# Patient Record
Sex: Male | Born: 1945 | Race: White | Hispanic: No | Marital: Married | Smoking: Never smoker
Health system: Southern US, Community
[De-identification: ages and names within clinical notes are randomized; demographics above are authoritative.]

## PROBLEM LIST (undated history)

## (undated) DIAGNOSIS — F419 Anxiety disorder, unspecified: Secondary | ICD-10-CM

## (undated) DIAGNOSIS — Z45018 Encounter for adjustment and management of other part of cardiac pacemaker: Secondary | ICD-10-CM

## (undated) DIAGNOSIS — F1020 Alcohol dependence, uncomplicated: Secondary | ICD-10-CM

## (undated) DIAGNOSIS — Z950 Presence of cardiac pacemaker: Secondary | ICD-10-CM

## (undated) DIAGNOSIS — F32A Depression, unspecified: Secondary | ICD-10-CM

## (undated) DIAGNOSIS — I10 Essential (primary) hypertension: Secondary | ICD-10-CM

## (undated) DIAGNOSIS — I495 Sick sinus syndrome: Secondary | ICD-10-CM

## (undated) DIAGNOSIS — F329 Major depressive disorder, single episode, unspecified: Secondary | ICD-10-CM

## (undated) DIAGNOSIS — C61 Malignant neoplasm of prostate: Secondary | ICD-10-CM

## (undated) DIAGNOSIS — C801 Malignant (primary) neoplasm, unspecified: Secondary | ICD-10-CM

## (undated) DIAGNOSIS — K219 Gastro-esophageal reflux disease without esophagitis: Secondary | ICD-10-CM

## (undated) DIAGNOSIS — Z9079 Acquired absence of other genital organ(s): Secondary | ICD-10-CM

## (undated) DIAGNOSIS — E785 Hyperlipidemia, unspecified: Secondary | ICD-10-CM

## (undated) DIAGNOSIS — N433 Hydrocele, unspecified: Secondary | ICD-10-CM

## (undated) DIAGNOSIS — I350 Nonrheumatic aortic (valve) stenosis: Secondary | ICD-10-CM

## (undated) DIAGNOSIS — M797 Fibromyalgia: Secondary | ICD-10-CM

## (undated) DIAGNOSIS — M109 Gout, unspecified: Secondary | ICD-10-CM

## (undated) HISTORY — PX: APPENDECTOMY: SHX54

## (undated) HISTORY — DX: Malignant neoplasm of prostate: C61

## (undated) HISTORY — PX: PROSTATECTOMY: SHX69

## (undated) HISTORY — DX: Gastro-esophageal reflux disease without esophagitis: K21.9

## (undated) HISTORY — DX: Hyperlipidemia, unspecified: E78.5

## (undated) HISTORY — DX: Acquired absence of other genital organ(s): Z90.79

## (undated) HISTORY — DX: Malignant (primary) neoplasm, unspecified: C80.1

## (undated) HISTORY — DX: Essential (primary) hypertension: I10

## (undated) HISTORY — PX: HYDROCELE EXCISION / REPAIR: SUR1145

## (undated) HISTORY — DX: Hydrocele, unspecified: N43.3

## (undated) HISTORY — DX: Nonrheumatic aortic (valve) stenosis: I35.0

## (undated) HISTORY — DX: Anxiety disorder, unspecified: F41.9

## (undated) HISTORY — DX: Fibromyalgia: M79.7

## (undated) HISTORY — DX: Gout, unspecified: M10.9

---
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
## 1985-11-07 ENCOUNTER — Ambulatory Visit: Admit: 1985-11-07 | Payer: Self-pay | Source: Ambulatory Visit

## 1987-07-28 ENCOUNTER — Ambulatory Visit: Admit: 1987-07-28 | Payer: Self-pay | Source: Ambulatory Visit

## 1988-05-01 ENCOUNTER — Ambulatory Visit: Admit: 1988-05-01 | Payer: Self-pay | Source: Ambulatory Visit

## 1988-05-05 ENCOUNTER — Ambulatory Visit: Admit: 1988-05-05 | Payer: Self-pay | Source: Ambulatory Visit

## 1988-05-07 ENCOUNTER — Ambulatory Visit: Admit: 1988-05-07 | Payer: Self-pay | Source: Ambulatory Visit

## 1988-08-31 ENCOUNTER — Ambulatory Visit: Admit: 1988-08-31 | Payer: Self-pay | Source: Ambulatory Visit

## 1990-04-05 ENCOUNTER — Ambulatory Visit: Admit: 1990-04-05 | Payer: Self-pay | Source: Ambulatory Visit

## 1990-05-19 ENCOUNTER — Ambulatory Visit: Admit: 1990-05-19 | Payer: Self-pay | Source: Ambulatory Visit

## 1997-12-25 ENCOUNTER — Ambulatory Visit: Admit: 1997-12-25 | Disposition: A | Discharge status: Home / Self Care | Payer: Self-pay | Source: Ambulatory Visit

## 1998-05-01 ENCOUNTER — Ambulatory Visit: Admit: 1998-05-01 | Disposition: A | Discharge status: Home / Self Care | Payer: Self-pay | Source: Ambulatory Visit

## 2000-03-22 ENCOUNTER — Ambulatory Visit
Admission: RE | Admit: 2000-03-22 | Disposition: A | Discharge status: Home / Self Care | Payer: Self-pay | Source: Ambulatory Visit

## 2001-12-03 ENCOUNTER — Ambulatory Visit
Admission: RE | Admit: 2001-12-03 | Disposition: A | Discharge status: Home / Self Care | Payer: Self-pay | Source: Ambulatory Visit

## 2001-12-04 ENCOUNTER — Ambulatory Visit
Admission: RE | Admit: 2001-12-04 | Disposition: A | Discharge status: Home / Self Care | Payer: Self-pay | Source: Ambulatory Visit

## 2002-06-26 ENCOUNTER — Ambulatory Visit
Admission: RE | Admit: 2002-06-26 | Disposition: A | Discharge status: Home / Self Care | Payer: Self-pay | Source: Ambulatory Visit

## 2002-08-30 ENCOUNTER — Ambulatory Visit: Admit: 2002-08-30 | Disposition: A | Discharge status: Home / Self Care | Payer: Self-pay

## 2003-05-06 ENCOUNTER — Ambulatory Visit
Admission: RE | Admit: 2003-05-06 | Disposition: A | Discharge status: Home / Self Care | Payer: Self-pay | Source: Ambulatory Visit

## 2003-06-30 ENCOUNTER — Ambulatory Visit
Admission: RE | Admit: 2003-06-30 | Disposition: A | Discharge status: Home / Self Care | Payer: Self-pay | Source: Ambulatory Visit

## 2003-10-12 HISTORY — PX: PROSTATECTOMY, RADICAL: SHX4999

## 2003-10-27 ENCOUNTER — Ambulatory Visit
Admission: RE | Admit: 2003-10-27 | Disposition: A | Discharge status: Home / Self Care | Payer: Self-pay | Source: Ambulatory Visit

## 2004-01-23 ENCOUNTER — Ambulatory Visit: Admit: 2004-01-23 | Disposition: A | Discharge status: Home / Self Care | Payer: Self-pay

## 2004-09-28 ENCOUNTER — Ambulatory Visit
Admission: RE | Admit: 2004-09-28 | Disposition: A | Discharge status: Home / Self Care | Payer: Self-pay | Source: Ambulatory Visit

## 2004-11-20 ENCOUNTER — Ambulatory Visit: Admit: 2004-11-20 | Disposition: A | Discharge status: Home / Self Care | Payer: Self-pay | Source: Ambulatory Visit

## 2004-12-16 ENCOUNTER — Ambulatory Visit
Admission: RE | Admit: 2004-12-16 | Disposition: A | Discharge status: Home / Self Care | Payer: Self-pay | Source: Ambulatory Visit

## 2004-12-25 ENCOUNTER — Ambulatory Visit
Admission: RE | Admit: 2004-12-25 | Disposition: A | Discharge status: Home / Self Care | Payer: Self-pay | Source: Ambulatory Visit

## 2005-01-15 ENCOUNTER — Ambulatory Visit
Admission: RE | Admit: 2005-01-15 | Disposition: A | Discharge status: Home / Self Care | Payer: Self-pay | Source: Ambulatory Visit

## 2005-01-28 ENCOUNTER — Ambulatory Visit
Admission: RE | Admit: 2005-01-28 | Disposition: A | Discharge status: Home / Self Care | Payer: Self-pay | Source: Ambulatory Visit

## 2005-02-18 ENCOUNTER — Ambulatory Visit
Admission: RE | Admit: 2005-02-18 | Disposition: A | Discharge status: Home / Self Care | Payer: Self-pay | Source: Ambulatory Visit

## 2005-03-31 ENCOUNTER — Ambulatory Visit
Admission: RE | Admit: 2005-03-31 | Disposition: A | Discharge status: Home / Self Care | Payer: Self-pay | Source: Ambulatory Visit

## 2005-09-17 ENCOUNTER — Ambulatory Visit
Admission: RE | Admit: 2005-09-17 | Disposition: A | Discharge status: Home / Self Care | Payer: Self-pay | Source: Ambulatory Visit

## 2005-11-05 ENCOUNTER — Ambulatory Visit: Admit: 2005-11-05 | Disposition: A | Discharge status: Home / Self Care | Payer: Self-pay | Source: Ambulatory Visit

## 2005-11-05 ENCOUNTER — Ambulatory Visit
Admission: AD | Admit: 2005-11-05 | Disposition: A | Discharge status: Home / Self Care | Payer: Self-pay | Source: Ambulatory Visit

## 2005-11-26 ENCOUNTER — Ambulatory Visit
Admission: RE | Admit: 2005-11-26 | Disposition: A | Discharge status: Home / Self Care | Payer: Self-pay | Source: Ambulatory Visit

## 2005-11-30 ENCOUNTER — Ambulatory Visit
Admission: RE | Admit: 2005-11-30 | Disposition: A | Discharge status: Home / Self Care | Payer: Self-pay | Source: Ambulatory Visit

## 2005-12-20 ENCOUNTER — Ambulatory Visit
Admission: RE | Admit: 2005-12-20 | Disposition: A | Discharge status: Home / Self Care | Payer: Self-pay | Source: Ambulatory Visit

## 2005-12-28 ENCOUNTER — Ambulatory Visit
Admission: RE | Admit: 2005-12-28 | Disposition: A | Discharge status: Home / Self Care | Payer: Self-pay | Source: Ambulatory Visit

## 2006-01-14 ENCOUNTER — Ambulatory Visit
Admission: RE | Admit: 2006-01-14 | Disposition: A | Discharge status: Home / Self Care | Payer: Self-pay | Source: Ambulatory Visit

## 2006-02-01 ENCOUNTER — Ambulatory Visit
Admission: RE | Admit: 2006-02-01 | Disposition: A | Discharge status: Home / Self Care | Payer: Self-pay | Source: Ambulatory Visit

## 2006-04-22 ENCOUNTER — Ambulatory Visit
Admission: RE | Admit: 2006-04-22 | Disposition: A | Discharge status: Home / Self Care | Payer: Self-pay | Source: Ambulatory Visit

## 2006-05-05 ENCOUNTER — Ambulatory Visit
Admission: RE | Admit: 2006-05-05 | Disposition: A | Discharge status: Home / Self Care | Payer: Self-pay | Source: Ambulatory Visit

## 2007-04-07 ENCOUNTER — Ambulatory Visit
Admission: RE | Admit: 2007-04-07 | Disposition: A | Discharge status: Home / Self Care | Payer: Self-pay | Source: Ambulatory Visit

## 2007-04-11 ENCOUNTER — Ambulatory Visit
Admission: RE | Admit: 2007-04-11 | Disposition: A | Discharge status: Home / Self Care | Payer: Self-pay | Source: Ambulatory Visit

## 2009-09-08 ENCOUNTER — Ambulatory Visit
Admission: RE | Admit: 2009-09-08 | Disposition: A | Discharge status: Home / Self Care | Payer: Self-pay | Source: Ambulatory Visit

## 2010-02-06 ENCOUNTER — Ambulatory Visit
Admission: RE | Admit: 2010-02-06 | Disposition: A | Discharge status: Home / Self Care | Payer: Self-pay | Source: Ambulatory Visit

## 2010-09-06 ENCOUNTER — Ambulatory Visit
Admission: RE | Admit: 2010-09-06 | Disposition: A | Discharge status: Home / Self Care | Payer: Self-pay | Source: Ambulatory Visit

## 2010-12-14 ENCOUNTER — Ambulatory Visit
Admission: RE | Admit: 2010-12-14 | Disposition: A | Discharge status: Home / Self Care | Payer: Self-pay | Source: Ambulatory Visit

## 2011-02-19 ENCOUNTER — Ambulatory Visit
Admission: RE | Admit: 2011-02-19 | Disposition: A | Discharge status: Home / Self Care | Payer: Self-pay | Source: Ambulatory Visit

## 2013-03-04 NOTE — Procedures (Signed)
[facility]                               CARDIOVASCULAR LABORATORY       2-D Echocardiogram              NAME: Sean [name], Sean [name]                                   ROOM#: O/--CP              DOB:  1946/07/11                                            AGE/SEX: 67/M       WG#:[id number]       [id number]       _________________________________________________________________________              TEST DATE: 12/14/2010                          REQ PHYS: Sabino Dick, MD       ORDER#: [id number]                                INT PHYS: Sabino Dick, MD       HGT: 28'10                                                     WEIGHT: 215       TECH: RB                                                      BP: 151/109       _________________________________________________________________________              TIME OF TEST:       8:07 AM              INDICATION:       Murmur.              INTERPRETATION:       2-D and M-mode measurements were performed and reveal the following:              RVID (ED):  --  (2.1-3.2) LVPW (ES):  1.9 (0.9-2.1)        IVS (ED):   1.5 (0.7-1.1) AORTA:      3.0 (2.2-3.7)        LVID (ED):  4.3 (4.0-5.6) LVOT:       2.0 (2.0-4.0)        LVPW (ED):  1.7 (0.7-1.1) LA:         3.5 (2.5-4.0)        IVS (ES):   2.0 (0.8-2.0) EF:         --  (55-77%)         LVID (  ES):  2.4 (2.0-3.8) EPSS:       --  (2-7 MM)                       DOPPLER/COLOR FLOW:       MITRAL VALVE:  ACTUAL NORMAL    AORTIC VALVE    ACTUAL NORMAL           V MAX M/SEC:   --     (0.6-1.3) V MAX M/SEC:    --     (1.0-1.7)        T 1/2 M/SEC:   --     (30-60)   LVOT VEL:       --     (0.7-1.1)        VALVE AREA:    --     (4-6 CM)  VALVE AREA:     --     (3-5 CM)         REGURG:        --               REGURG:         --                      PISA:          --               PRES 1/2 TIME:  --                      ERO:           --               AO MAX PG:      26.8                    RV:            --                AO MEAN PG:     14.7                                  TRICUSPID VALVE ACTUAL NORMAL    PULMONIC VALVE ACTUAL NORMAL           V MAX M/SEC:    --     (0.3-0.7) V MAX M/SEC:   --     (0.6-0.9)        REGURG:         --               REGURG         --                      RV SYSTOLIC:    --     (15-30)                                          PRESSURE:       --  A complete 2-D echo including M-mode, Spectral and color flow Doppler       was performed in a 67 year old with a murmur.  The study was somewhat       technically difficult but adequate.  Blood pressure at the time of the       study was 151/109.              1.  Atria#  The atria are grossly normal in size, the left atrium           measuring 3.5 cm in the parasternal long axis view.  The atrial                  septum is intact with no color flow evidence of shunt.  The inferior           vena cava is not well seen but is probably normal in size.       2.  Ventricles:  The right ventricle is normal in size with normal           contraction.  The left ventricle is normal in size.  Left ventricular           end diastolic dimension is 4.3 cm and end systolic dimension 2.4 cm.           There is moderate left ventricular hypertrophy present, the posterior           wall measuring 1.7 cm, septum 1.5 cm.  The ejection fraction would           appear to be in the 65% range with no regional wall motion           abnormalities.       3.  Great Vessels:  The aorta measures 3.0 cm.  The pulmonary artery           is grossly normal in size.       4.  Aortic valve:  Probably trileaflet with sclerosis and           calcification.  There is some decreased mobility.  The peak velocity           across the valve was 258 cm/sec with a peak instantaneous gradient of           27 mmHg, a mean gradient of 15 mmHg and valve area somewhere between           1.5-1.9 cm.sq.       5.  Pulmonic valve:   Not well seen.  No abnormality.       6.  Mitral valve:  No stenosis or prolapse.  No clear mitral           regurgitation.       7.  Tricuspid valve:  Grossly normal.  No tricuspid regurgitation.              HEMODYNAMICS:              1.  There was not enough tricuspid regurgitation to permit           estimation of right ventricular systolic pressure.  Mitral Doppler           inflow demonstrates an E:A reversal with a peak E velocity of           approximately 84 cm/sec.  At the lateral annulus the E:E prime ratio  is 6.3.              CONCLUSION:       1.  Mild aortic stenosis with mean gradient of 15 mmHg.       2.  Moderate left ventricular hypertrophy with ejection fraction           approximately 65%.       3.  Grade I diastolic dysfunction.                            **PRELIMINARY REPORT UNLESS SIGNED**       SEE DOCUMENT IMAGING SYSTEM FOR FINAL REPORT                                 ________________________________                              Sabino Dick, MD                                            ID:   [id number]       DocType:   01TRVC       \:   TW       /:   569       DD:  12/14/2010 12:37:46       DT:  12/15/2010 11:47:23       JOB: [id number]       CC:  Hyman Bible II, MD ([id number])       >  Authenticated by Sabino Dick., MD On 12/16/2010 04:41:47 PM

## 2013-04-06 ENCOUNTER — Emergency Department: Payer: BC Managed Care – PPO

## 2013-04-06 ENCOUNTER — Inpatient Hospital Stay: Payer: BC Managed Care – PPO

## 2013-04-06 ENCOUNTER — Inpatient Hospital Stay
Admission: EM | Admit: 2013-04-06 | Discharge: 2013-04-12 | DRG: 242 | Disposition: A | Discharge status: Home / Self Care | Payer: BC Managed Care – PPO | Attending: Cardiovascular Disease | Admitting: Cardiovascular Disease

## 2013-04-06 ENCOUNTER — Inpatient Hospital Stay: Payer: BC Managed Care – PPO | Admitting: Critical Care Medicine

## 2013-04-06 DIAGNOSIS — J9600 Acute respiratory failure, unspecified whether with hypoxia or hypercapnia: Secondary | ICD-10-CM | POA: Diagnosis not present

## 2013-04-06 DIAGNOSIS — E860 Dehydration: Secondary | ICD-10-CM | POA: Diagnosis present

## 2013-04-06 DIAGNOSIS — Z8249 Family history of ischemic heart disease and other diseases of the circulatory system: Secondary | ICD-10-CM

## 2013-04-06 DIAGNOSIS — Y9210 Unspecified residential institution as the place of occurrence of the external cause: Secondary | ICD-10-CM | POA: Diagnosis not present

## 2013-04-06 DIAGNOSIS — I469 Cardiac arrest, cause unspecified: Secondary | ICD-10-CM | POA: Diagnosis present

## 2013-04-06 DIAGNOSIS — I219 Acute myocardial infarction, unspecified: Secondary | ICD-10-CM | POA: Diagnosis not present

## 2013-04-06 DIAGNOSIS — Y849 Medical procedure, unspecified as the cause of abnormal reaction of the patient, or of later complication, without mention of misadventure at the time of the procedure: Secondary | ICD-10-CM | POA: Diagnosis not present

## 2013-04-06 DIAGNOSIS — F1010 Alcohol abuse, uncomplicated: Secondary | ICD-10-CM | POA: Diagnosis present

## 2013-04-06 DIAGNOSIS — S2249XA Multiple fractures of ribs, unspecified side, initial encounter for closed fracture: Secondary | ICD-10-CM | POA: Diagnosis not present

## 2013-04-06 DIAGNOSIS — Z2821 Immunization not carried out because of patient refusal: Secondary | ICD-10-CM

## 2013-04-06 DIAGNOSIS — R3989 Other symptoms and signs involving the genitourinary system: Secondary | ICD-10-CM | POA: Diagnosis present

## 2013-04-06 DIAGNOSIS — E785 Hyperlipidemia, unspecified: Secondary | ICD-10-CM | POA: Diagnosis present

## 2013-04-06 DIAGNOSIS — Z79899 Other long term (current) drug therapy: Secondary | ICD-10-CM

## 2013-04-06 DIAGNOSIS — I4729 Other ventricular tachycardia: Principal | ICD-10-CM | POA: Diagnosis present

## 2013-04-06 DIAGNOSIS — I472 Ventricular tachycardia, unspecified: Principal | ICD-10-CM | POA: Diagnosis present

## 2013-04-06 DIAGNOSIS — R55 Syncope and collapse: Secondary | ICD-10-CM | POA: Diagnosis present

## 2013-04-06 DIAGNOSIS — K219 Gastro-esophageal reflux disease without esophagitis: Secondary | ICD-10-CM | POA: Diagnosis present

## 2013-04-06 DIAGNOSIS — M25519 Pain in unspecified shoulder: Secondary | ICD-10-CM | POA: Diagnosis present

## 2013-04-06 DIAGNOSIS — Z9079 Acquired absence of other genital organ(s): Secondary | ICD-10-CM

## 2013-04-06 DIAGNOSIS — I959 Hypotension, unspecified: Secondary | ICD-10-CM

## 2013-04-06 DIAGNOSIS — J90 Pleural effusion, not elsewhere classified: Secondary | ICD-10-CM | POA: Diagnosis present

## 2013-04-06 DIAGNOSIS — E872 Acidosis, unspecified: Secondary | ICD-10-CM | POA: Diagnosis present

## 2013-04-06 DIAGNOSIS — E876 Hypokalemia: Secondary | ICD-10-CM | POA: Diagnosis present

## 2013-04-06 DIAGNOSIS — M109 Gout, unspecified: Secondary | ICD-10-CM | POA: Diagnosis present

## 2013-04-06 DIAGNOSIS — Z9089 Acquired absence of other organs: Secondary | ICD-10-CM

## 2013-04-06 DIAGNOSIS — I10 Essential (primary) hypertension: Secondary | ICD-10-CM | POA: Diagnosis present

## 2013-04-06 DIAGNOSIS — N179 Acute kidney failure, unspecified: Secondary | ICD-10-CM | POA: Diagnosis present

## 2013-04-06 LAB — CBC AND DIFFERENTIAL
Basophils %: 0.3 % (ref 0.0–3.0)
Basophils Absolute: 0 10*3/uL (ref 0.0–0.3)
Eosinophils %: 0.5 % (ref 0.0–7.0)
Eosinophils Absolute: 0.1 10*3/uL (ref 0.0–0.8)
Hematocrit: 36.8 % — ABNORMAL LOW (ref 39.0–52.5)
Hemoglobin: 12.5 gm/dL — ABNORMAL LOW (ref 13.0–17.5)
Lymphocytes Absolute: 1.4 10*3/uL (ref 0.6–5.1)
Lymphocytes: 13.4 % — ABNORMAL LOW (ref 15.0–46.0)
MCH: 30 pg (ref 28–35)
MCHC: 34 gm/dL (ref 32–36)
MCV: 87 fL (ref 80–100)
MPV: 7.8 fL (ref 6.0–10.0)
Monocytes Absolute: 1 10*3/uL (ref 0.1–1.7)
Monocytes: 9.3 % (ref 3.0–15.0)
Neutrophils %: 76.5 % (ref 42.0–78.0)
Neutrophils Absolute: 7.8 10*3/uL (ref 1.7–8.6)
PLT CT: 230 10*3/uL (ref 130–440)
RBC: 4.24 10*6/uL (ref 4.00–5.70)
RDW: 13 % (ref 11.0–14.0)
WBC: 10.2 10*3/uL (ref 4.0–11.0)

## 2013-04-06 LAB — URINALYSIS
Bilirubin, UA: NEGATIVE mg/dL
Blood, UA: NEGATIVE mg/dL
Glucose, UA: NEGATIVE mg/dL
Leukocyte Esterase, UA: NEGATIVE Leu/uL
Nitrite, UA: NEGATIVE
RBC, UA: 1 /hpf (ref 0–4)
Squam Epithel, UA: <1 /hpf (ref 0–2)
Urine Protein: 30 mg/dL — AB
Urine Specific Gravity: 1.022 (ref 1.001–1.040)
Urobilinogen, UA: NORMAL mg/dL
WBC, UA: 4 /hpf (ref 0–4)
pH, Urine: 5.5 pH (ref 5.0–8.0)

## 2013-04-06 LAB — VH URINE DRUG SCREEN - NO CONFIRMATION
Amphetamine: NEGATIVE
Barbiturates: NEGATIVE
Cannabinoids: NEGATIVE
Cocaine: NEGATIVE
Opiates: NEGATIVE
Phencyclidine: NEGATIVE
Urine Benzodiazepines: POSITIVE — AB
Urine Creatinine Random: 160.86 mg/dL
Urine Methadone Screen: NEGATIVE
Urine Oxycodone: NEGATIVE
Urine Specific Gravity: 1.018 (ref 1.001–1.040)
pH, Urine: 5.4 pH (ref 5.0–8.0)

## 2013-04-06 LAB — COMPREHENSIVE METABOLIC PANEL
ALT: 26 U/L (ref 0–55)
AST (SGOT): 23 U/L (ref 10–42)
Albumin/Globulin Ratio: 0.91 Ratio (ref 0.70–1.50)
Albumin: 3 gm/dL — ABNORMAL LOW (ref 3.5–5.0)
Alkaline Phosphatase: 75 U/L (ref 40–145)
Anion Gap: 14 mMol/L (ref 7.0–18.0)
BUN / Creatinine Ratio: 13 Ratio (ref 10.0–30.0)
BUN: 21 mg/dL (ref 7–22)
Bilirubin, Total: 0.2 mg/dL (ref 0.1–1.2)
CO2: 23 mMol/L (ref 20.0–30.0)
Calcium: 9.2 mg/dL (ref 8.5–10.5)
Chloride: 105 mMol/L (ref 98–110)
Creatinine: 1.62 mg/dL — ABNORMAL HIGH (ref 0.80–1.30)
EGFR: 43 mL/min/{1.73_m2}
Globulin: 3.3 gm/dL (ref 2.0–4.0)
Glucose: 128 mg/dL — ABNORMAL HIGH (ref 70–99)
Osmolality Calc: 282 mOsm/kg (ref 275–300)
Potassium: 3 mMol/L — CL (ref 3.5–5.3)
Protein, Total: 6.3 gm/dL (ref 6.0–8.3)
Sodium: 139 mMol/L (ref 136–147)

## 2013-04-06 LAB — VH CARDIAC PROF.WITH TROPONIN
Creatine Kinase (CK): 156 U/L (ref 30–230)
Creatinine Kinase MB (CKMB): 1.8 ng/mL (ref 0.1–6.0)
Troponin I: <0.01 ng/mL (ref 0.00–0.02)

## 2013-04-06 LAB — TSH: TSH: 1.27 u[IU]/mL (ref 0.40–4.20)

## 2013-04-06 LAB — VH I-STAT LACTIC ACID NOTIFICATION

## 2013-04-06 LAB — ECG 12-LEAD
P Wave Axis: 49 deg
P Wave Duration: 98 ms
P-R Interval: 142 ms
Patient Age: 67 years
Q-T Dispersion: 68 ms
Q-T Interval(Corrected): 484 ms
Q-T Interval: 370 ms
QRS Axis: 57 deg
QRS Duration: 94 ms
T Axis: 21 deg
Ventricular Rate: 103 /min

## 2013-04-06 LAB — I-STAT LACTIC ACID
Lactic Acid I-Stat: 3.98 mMol/L (ref 0.50–2.10)
Room Number I-Stat: 27

## 2013-04-06 LAB — ACETAMINOPHEN LEVEL: Acetaminophen Level: <3 ug/mL — ABNORMAL LOW (ref 10.0–30.0)

## 2013-04-06 LAB — SALICYLATE LEVEL: Salicylate Level: <5 mg/dL — ABNORMAL LOW (ref 5.0–30.0)

## 2013-04-06 LAB — ETHANOL: Alcohol: 92 mg/dL — ABNORMAL HIGH (ref 0–9)

## 2013-04-06 LAB — PHOSPHORUS: Phosphorus: 3.1 mg/dL (ref 2.3–4.7)

## 2013-04-06 MED ORDER — POTASSIUM CHLORIDE 10 MEQ/100ML IV SOLN
INTRAVENOUS | Status: AC
Start: 2013-04-06 — End: ?
  Filled 2013-04-06: qty 100

## 2013-04-06 MED ORDER — SODIUM CHLORIDE 0.9 % IV BOLUS
1000.00 mL | Freq: Once | INTRAVENOUS | Status: AC
Start: 2013-04-06 — End: 2013-04-06
  Administered 2013-04-06: 1000 mL via INTRAVENOUS

## 2013-04-06 MED ORDER — LACTATED RINGERS IV BOLUS
1000.00 mL | Freq: Once | INTRAVENOUS | Status: AC
Start: 2013-04-06 — End: 2013-04-06
  Administered 2013-04-06: 1000 mL via INTRAVENOUS

## 2013-04-06 MED ORDER — SODIUM CHLORIDE 0.9 % IV SOLN
INTRAVENOUS | Status: DC
Start: 2013-04-06 — End: 2013-04-06

## 2013-04-06 MED ORDER — POTASSIUM CHLORIDE IN NACL 20-0.9 MEQ/L-% IV SOLN
INTRAVENOUS | Status: DC
Start: 2013-04-06 — End: 2013-04-08
  Filled 2013-04-06 (×9): qty 1000

## 2013-04-06 MED ORDER — LORAZEPAM 2 MG/ML IJ SOLN
2.0000 mg | INTRAMUSCULAR | Status: DC | PRN
Start: 2013-04-06 — End: 2013-04-12
  Administered 2013-04-10: 1 mg via INTRAVENOUS

## 2013-04-06 MED ORDER — CEFEPIME HCL 2 G IJ SOLR
INTRAMUSCULAR | Status: AC
Start: 2013-04-06 — End: ?
  Filled 2013-04-06: qty 2

## 2013-04-06 MED ORDER — SODIUM CHLORIDE 0.9 % IV SOLN
2.0000 mg/h | INTRAVENOUS | Status: DC | PRN
Start: 2013-04-06 — End: 2013-04-12
  Filled 2013-04-06: qty 50
  Filled 2013-04-06: qty 1

## 2013-04-06 MED ORDER — DULOXETINE HCL 30 MG PO CPEP
30.0000 mg | ORAL_CAPSULE | Freq: Two times a day (BID) | ORAL | Status: DC
Start: 2013-04-06 — End: 2013-04-12
  Administered 2013-04-07 – 2013-04-12 (×11): 30 mg via ORAL
  Filled 2013-04-06 (×13): qty 1

## 2013-04-06 MED ORDER — SODIUM CHLORIDE 0.9 % IV SOLN
INTRAVENOUS | Status: AC
Start: 2013-04-06 — End: ?
  Filled 2013-04-06: qty 100

## 2013-04-06 MED ORDER — ATORVASTATIN CALCIUM 20 MG PO TABS
20.00 mg | ORAL_TABLET | Freq: Every evening | ORAL | Status: DC
Start: 2013-04-06 — End: 2013-04-12
  Administered 2013-04-06 – 2013-04-11 (×6): 20 mg via ORAL
  Filled 2013-04-06 (×8): qty 1

## 2013-04-06 MED ORDER — PANTOPRAZOLE SODIUM 40 MG PO TBEC
40.0000 mg | DELAYED_RELEASE_TABLET | Freq: Every morning | ORAL | Status: DC
Start: 2013-04-07 — End: 2013-04-12
  Administered 2013-04-07 – 2013-04-12 (×6): 40 mg via ORAL
  Filled 2013-04-06 (×6): qty 1

## 2013-04-06 MED ORDER — VANCOMYCIN HCL IN DEXTROSE 1-5 GM/200ML-% IV SOLN
INTRAVENOUS | Status: AC
Start: 2013-04-06 — End: ?
  Filled 2013-04-06: qty 1000

## 2013-04-06 MED ORDER — CEFEPIME HCL 2 G IJ SOLR
2.00 g | Freq: Once | INTRAMUSCULAR | Status: AC
Start: 2013-04-06 — End: 2013-04-06
  Administered 2013-04-06: 2 g via INTRAVENOUS

## 2013-04-06 MED ORDER — VANCOMYCIN HCL IN DEXTROSE 1-5 GM/200ML-% IV SOLN
1.0000 g | Freq: Once | INTRAVENOUS | Status: AC
Start: 2013-04-06 — End: 2013-04-06
  Administered 2013-04-06: 1 g via INTRAVENOUS

## 2013-04-06 MED ORDER — LABETALOL HCL 200 MG PO TABS
200.0000 mg | ORAL_TABLET | Freq: Three times a day (TID) | ORAL | Status: DC
Start: 2013-04-06 — End: 2013-04-09
  Administered 2013-04-06 – 2013-04-09 (×8): 200 mg via ORAL
  Filled 2013-04-06 (×11): qty 1

## 2013-04-06 MED ORDER — DULOXETINE HCL 20 MG PO CPEP
20.0000 mg | ORAL_CAPSULE | Freq: Two times a day (BID) | ORAL | Status: DC
Start: 2013-04-06 — End: 2013-04-06

## 2013-04-06 MED ORDER — LABETALOL HCL 5 MG/ML IV SOLN
10.0000 mg | INTRAVENOUS | Status: DC | PRN
Start: 2013-04-06 — End: 2013-04-09

## 2013-04-06 MED ORDER — SODIUM CHLORIDE 0.9 % IV SOLN
100.0000 mL/h | INTRAVENOUS | Status: DC
Start: 2013-04-06 — End: 2013-04-06
  Administered 2013-04-06: 100 mL/h via INTRAVENOUS

## 2013-04-06 MED ORDER — AMLODIPINE BESYLATE 5 MG PO TABS
5.0000 mg | ORAL_TABLET | Freq: Every day | ORAL | Status: DC
Start: 2013-04-06 — End: 2013-04-12
  Administered 2013-04-08 – 2013-04-12 (×4): 5 mg via ORAL
  Filled 2013-04-06 (×6): qty 1

## 2013-04-06 MED ORDER — SODIUM CHLORIDE 0.9 % IV BOLUS
2000.0000 mL | Freq: Once | INTRAVENOUS | Status: AC
Start: 2013-04-06 — End: 2013-04-06
  Administered 2013-04-06: 2000 mL via INTRAVENOUS
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
## 2013-04-06 NOTE — Progress Notes (Signed)
Patient had 27 beats of V tach, Dr. Mervin Hack aware.
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
## 2013-04-06 NOTE — Progress Notes (Signed)
Patient urinated 400 dark yellow urine in urinal, UA sent.
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
## 2013-04-06 NOTE — ED Notes (Signed)
Pt states "I feel fine, I am ready to go home".  Pt verbalizes understanding that he will be going to the ICU.

## 2013-04-07 ENCOUNTER — Inpatient Hospital Stay: Payer: BC Managed Care – PPO

## 2013-04-07 LAB — COMPREHENSIVE METABOLIC PANEL
ALT: 21 U/L (ref 0–55)
AST (SGOT): 26 U/L (ref 10–42)
Albumin/Globulin Ratio: 0.82 Ratio (ref 0.70–1.50)
Albumin: 2.3 gm/dL — ABNORMAL LOW (ref 3.5–5.0)
Alkaline Phosphatase: 65 U/L (ref 40–145)
Anion Gap: 8.8 mMol/L (ref 7.0–18.0)
BUN / Creatinine Ratio: 16.7 Ratio (ref 10.0–30.0)
BUN: 18 mg/dL (ref 7–22)
Bilirubin, Total: 0.3 mg/dL (ref 0.1–1.2)
CO2: 25 mMol/L (ref 20.0–30.0)
Calcium: 8.3 mg/dL — ABNORMAL LOW (ref 8.5–10.5)
Chloride: 111 mMol/L — ABNORMAL HIGH (ref 98–110)
Creatinine: 1.08 mg/dL (ref 0.80–1.30)
EGFR: >60 mL/min/{1.73_m2}
Globulin: 2.8 gm/dL (ref 2.0–4.0)
Glucose: 107 mg/dL — ABNORMAL HIGH (ref 70–99)
Osmolality Calc: 284 mOsm/kg (ref 275–300)
Potassium: 3.8 mMol/L (ref 3.5–5.3)
Protein, Total: 5.1 gm/dL — ABNORMAL LOW (ref 6.0–8.3)
Sodium: 141 mMol/L (ref 136–147)

## 2013-04-07 LAB — CBC AND DIFFERENTIAL
Basophils %: 0 % (ref 0.0–3.0)
Basophils Absolute: 0 10*3/uL (ref 0.0–0.3)
Eosinophils %: 1 % (ref 0.0–7.0)
Eosinophils Absolute: 0.1 10*3/uL (ref 0.0–0.8)
Hematocrit: 34.4 % — ABNORMAL LOW (ref 39.0–52.5)
Hemoglobin: 11.7 gm/dL — ABNORMAL LOW (ref 13.0–17.5)
Lymphocytes Absolute: 1.3 10*3/uL (ref 0.6–5.1)
Lymphocytes: 17 % (ref 15.0–46.0)
MCH: 30 pg (ref 28–35)
MCHC: 34 gm/dL (ref 32–36)
MCV: 88 fL (ref 80–100)
MPV: 8.2 fL (ref 6.0–10.0)
Monocytes Absolute: 0.8 10*3/uL (ref 0.1–1.7)
Monocytes: 11.4 % (ref 3.0–15.0)
Neutrophils %: 70.5 % (ref 42.0–78.0)
Neutrophils Absolute: 5.2 10*3/uL (ref 1.7–8.6)
PLT CT: 187 10*3/uL (ref 130–440)
RBC: 3.91 10*6/uL — ABNORMAL LOW (ref 4.00–5.70)
RDW: 13.1 % (ref 11.0–14.0)
WBC: 7.4 10*3/uL (ref 4.0–11.0)

## 2013-04-07 LAB — LACTIC ACID, PLASMA: Lactic Acid: 1.2 mMol/L (ref 0.5–2.1)

## 2013-04-07 LAB — VH CARDIAC PROF.WITH TROPONIN
CKMB Index: 7.6 % (ref 0.0–2.3)
CKMB Index: 6.9 % (ref 0.0–2.3)
Creatine Kinase (CK): 242 U/L — ABNORMAL HIGH (ref 30–230)
Creatine Kinase (CK): 208 U/L (ref 30–230)
Creatinine Kinase MB (CKMB): 18.3 ng/mL — ABNORMAL HIGH (ref 0.1–6.0)
Creatinine Kinase MB (CKMB): 14.4 ng/mL — ABNORMAL HIGH (ref 0.1–6.0)
Troponin I: 2.49 ng/mL (ref 0.00–0.02)
Troponin I: 1.72 ng/mL (ref 0.00–0.02)

## 2013-04-07 LAB — PHOSPHORUS: Phosphorus: 2.9 mg/dL (ref 2.3–4.7)

## 2013-04-07 LAB — TROPONIN I: Troponin I: 1.09 ng/mL (ref 0.00–0.02)

## 2013-04-07 LAB — MAGNESIUM: Magnesium: 1.4 mg/dL — ABNORMAL LOW (ref 1.6–2.6)

## 2013-04-07 LAB — CKMB: Creatinine Kinase MB (CKMB): 10.7 ng/mL — ABNORMAL HIGH (ref 0.1–6.0)

## 2013-04-07 MED ORDER — ZOLPIDEM TARTRATE 5 MG PO TABS
10.00 mg | ORAL_TABLET | Freq: Every evening | ORAL | Status: DC | PRN
Start: 2013-04-07 — End: 2013-04-12
  Administered 2013-04-07 – 2013-04-10 (×3): 10 mg via ORAL
  Filled 2013-04-07 (×3): qty 2

## 2013-04-07 MED ORDER — ASPIRIN 81 MG PO TBEC
324.0000 mg | DELAYED_RELEASE_TABLET | Freq: Every day | ORAL | Status: DC
Start: 2013-04-07 — End: 2013-04-12
  Administered 2013-04-08 – 2013-04-12 (×4): 324 mg via ORAL
  Filled 2013-04-07 (×3): qty 4
  Filled 2013-04-07: qty 1
  Filled 2013-04-07: qty 4

## 2013-04-07 MED ORDER — VH MAGNESIUM SULFATE 2 G IN 50 ML IV PREMIX
2.0000 g | Freq: Once | INTRAVENOUS | Status: AC
Start: 2013-04-07 — End: 2013-04-07
  Administered 2013-04-07: 2 g via INTRAVENOUS
  Filled 2013-04-07: qty 50

## 2013-04-07 MED ORDER — POTASSIUM CHLORIDE CRYS ER 20 MEQ PO TBCR
40.0000 meq | EXTENDED_RELEASE_TABLET | Freq: Once | ORAL | Status: AC
Start: 2013-04-07 — End: 2013-04-07
  Administered 2013-04-07: 40 meq via ORAL
  Filled 2013-04-07: qty 2

## 2013-04-07 MED ORDER — ACETAMINOPHEN 80 MG PO CHEW
320.0000 mg | CHEWABLE_TABLET | Freq: Once | ORAL | Status: DC
Start: 2013-04-07 — End: 2013-04-07
  Filled 2013-04-07: qty 4

## 2013-04-07 MED ORDER — POTASSIUM CHLORIDE 20 MEQ/15ML (10%) PO SOLN
40.0000 meq | Freq: Once | ORAL | Status: DC
Start: 2013-04-07 — End: 2013-04-07
  Filled 2013-04-07: qty 30

## 2013-04-07 MED ORDER — LACTATED RINGERS IV BOLUS
1000.0000 mL | Freq: Once | INTRAVENOUS | Status: AC
Start: 2013-04-07 — End: 2013-04-07
  Administered 2013-04-07: 1000 mL via INTRAVENOUS

## 2013-04-07 MED ORDER — ASPIRIN 81 MG PO CHEW
CHEWABLE_TABLET | ORAL | Status: AC
Start: 2013-04-07 — End: 2013-04-07
  Filled 2013-04-07: qty 3

## 2013-04-07 MED ORDER — ACETAMINOPHEN 325 MG PO TABS
650.00 mg | ORAL_TABLET | Freq: Four times a day (QID) | ORAL | Status: DC | PRN
Start: 2013-04-07 — End: 2013-04-12
  Administered 2013-04-07 – 2013-04-08 (×3): 650 mg via ORAL
  Filled 2013-04-07 (×3): qty 2

## 2013-04-07 NOTE — Consults (Signed)
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 May have passed out for a few seconds and came to ER with a systolic of 60 and an elevated creatinine of 1.68. Subsequent cpks and troponins have been positive. He denies any chest pain. There may have been a run of nsvt but potassium was low. Initial ekg showed lateral ST depression which has now resolved as has elevated creatinine. Most likely the enzyme elevation represents supply demand mismatch, but patient should stay and have a nuclear stress test and echocardiogram on Monday.  Thank you for this consult. Full dictated note to follow. Dictation number # [id number]

## 2013-04-07 NOTE — Progress Notes (Signed)
Chewable aspirin ordered and stat EKG

## 2013-04-07 NOTE — Progress Notes (Signed)
[facility]   Daily Progress Note      Patient Name: Sean [name], Sean [name]  Date/Time: 04/07/2013 10:30 AM  Attending Physician: Cristal Deer, MD  Location/Room: 3536/3536-A     Interval Events:   Dehydrated. Creatinine bumped but resolved.  Troponin 2.49 with run of VT overnight.  Feels better after rehydration.    Subjective:   Feels better    Assessment:     Active Hospital Problems    Diagnosis   . Myocardial infarct   . Syncope   . Hypokalemia   . Lactic acid acidosis   . Acute kidney injury   . Dehydration   . Ventricular tachycardia         Plan:     Cardiovascular: Continue current management. Cardiology consult - discussed with Dr. Manson Passey. Continue fluid rescuscitation.  No further V Tach. Hold Norvasc with hypotension    Pulmonary: Continue current management. Continue supplemental oxygen as needed.    Gastrointestinal: Continue current management.    Infectious Disease: No issues identified. On Cefepime and Vanco - will stop.    Neurologic: Alert and oriented. No focal deficit    Renal: AKI resolved. Avoid nephrotoxic medications. Replete electrolytes as needed.    Hem/Onc: No issues identified.    Endocrine: Monitor blood sugars. and Start sliding scale insulin if hyperglycemic      ICU Management:  Sedation: None needed.  Nutrition: Cardiac diet.  GI Prophylaxis: PPI daily  VTE Prophylaxis: SCDs/Pneumatic Stockings  Foley Catheter: "not needed      Disposition: Keep in ICU  Code Status: Full Code    Physical Exam:   Vitals: BP 89/55  Pulse 82  Temp 98 F (36.7 C) (Oral)  Resp 19  Ht 1.778 m (5\' 10" )  Wt 115.2 kg (253 lb 15.5 oz)  BMI 36.44 kg/m2  SpO2 96%  I/O:   Intake/Output Summary (Last 24 hours) at 04/07/13 1030  Last data filed at 04/07/13 1000   Gross per 24 hour   Intake 2748.5 ml   Output   1400 ml   Net 1348.5 ml     Vent settings: reviewed.  Admission Weight: Weight: 115.2 kg (253 lb 15.5 oz)    General Appearance:  alert, well appearing, and in no distress    Mental  status: alert, oriented to person, place, and time    Neuro: alert, oriented, normal speech, no focal findings or movement disorder noted    HEENT: ENT exam normal, no neck nodes or sinus tenderness    Neck: supple, no significant adenopathy    Lungs: clear to auscultation, no wheezes, rales or rhonchi, symmetric air entry    Cardiac: normal rate, regular rhythm, normal S1, S2, no murmurs, rubs, clicks or gallops    Abdomen: soft, nontender, nondistended, no masses or organomegaly    Extremities: peripheral pulses normal, no pedal edema, no clubbing or cyanosis    Skin: normal coloration and turgor, no rashes, no suspicious skin lesions noted      Labs:     CBC:   Lab 04/07/13 0420   WBC 7.4   HGB 11.7*   HCT 34.4*   PLT 187       Bands:No results found for this basename: BANDS in the last 72 hours  Chemistry: Recent Labs   Basename 04/07/13 0420 04/06/13 2344 04/06/13 1856    NA 141 -- --    K 3.8 -- --    CL 111* -- --    CO2 25.0 -- --  BUN 18 -- --    CREAT 1.08 -- --    EGFR >60 -- --    GLU 107* -- --    CA 8.3* -- --    MG -- 1.4* --    PHOS -- -- 3.1     LFTs:   Lab 04/07/13 0420   ALB 2.3*   PROT 5.1*   BILITOTAL 0.3   ALKPHOS 65   ALT 21   AST 26   GLU 107*     Lactate:  Recent Labs   Basename 04/07/13 0420    LACTATE 1.2     Coags: No results found for this basename: PT,INR,PTT in the last 168 hours    ABGs:  i-STAT Lactic acid   Date Value Range Status   04/06/2013 3.98* 0.50 - 2.10 mMol/L Final      Results called and read back by (licensed clinician/date/time/tech): Bernette Redbird, MD [id number]      The above 4 analytes were performed by [facility] [id number])      [address],[place] [address]     POC Gloucose:  No results found for this basename: POCGLU:5 in the last 72 hours    Radiology / Imaging:     Imaging personally reviewed by me, including CXR: 6/28  IMPRESSION:   Limited portable examination demonstrating increasing perihilar atelectasis on the  right. There is no focal   consolidation. There is no pneumothorax or acute displaced fracture.       Attestation & Billing:     Patient's condition and plan discussed with: patient    This patient has a high probability of sudden clinically significant deterioration which requires the highest level of physician preparedness to intervene urgently. I managed/supervised life or organ supporting interventions that required frequent physician assessments. I devoted my full attention in the ICU to the direct care of this patient for this period of time.    Any critical care time was performed today and is exclusive of teaching, billable procedures, and not overlapping with any other providers.    Billing: Level 3 Subsequent [id number]    Signed by: Cristal Deer, MD   KG:[id number], [name], MD

## 2013-04-07 NOTE — Progress Notes (Signed)
Patient snoring loudly, went in to check on, wants to try and urinate, peed 300 mL in urinal. No complaints of pain.

## 2013-04-07 NOTE — Progress Notes (Signed)
Patients blood pressures in the 70s-80s, LR bolus 1047ml/hr per Dr. Mervin Hack.
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
## 2013-04-07 NOTE — Progress Notes (Signed)
Cardiac enzymes elevated, Dr. Mervin Hack notified, states he will consult cardiology. O2 at 2 L NC placed back on patient and O2 sat probe

## 2013-04-07 NOTE — Progress Notes (Signed)
12 beat run of vtach, Dr. Mervin Hack notified and orders 40 of K PO and 2 G of Mag IV. Patient was sleeping and once made aware he said he didn't feel anything.

## 2013-04-07 NOTE — Progress Notes (Signed)
Pt admitted to room 345 from CC3. Very pleasant. Oriented to room. Tele intact showing SR 70s. NS with 20 meq k hung per order. Pt requests Remus Loffler, stating he takes 10mg  at home, and tylenol for his back. Orders received from Dr. Manson Passey. Pt denies other needs at this time. CIWA scale 0. Will monitor

## 2013-04-07 NOTE — Progress Notes (Signed)
Patient is lying in bed comfortably. No complaints of pain, just states that he wants to go home. NS with 20 K still at 150, both IV sites still intact. Transfer orders have been placed. Awaiting an open bed.

## 2013-04-08 LAB — CBC AND DIFFERENTIAL
Basophils %: 0.1 % (ref 0.0–3.0)
Basophils Absolute: 0 10*3/uL (ref 0.0–0.3)
Eosinophils %: 4.1 % (ref 0.0–7.0)
Eosinophils Absolute: 0.2 10*3/uL (ref 0.0–0.8)
Hematocrit: 32.7 % — ABNORMAL LOW (ref 39.0–52.5)
Hemoglobin: 11.3 gm/dL — ABNORMAL LOW (ref 13.0–17.5)
Lymphocytes Absolute: 1.2 10*3/uL (ref 0.6–5.1)
Lymphocytes: 20.9 % (ref 15.0–46.0)
MCH: 30 pg (ref 28–35)
MCHC: 35 gm/dL (ref 32–36)
MCV: 88 fL (ref 80–100)
MPV: 8.2 fL (ref 6.0–10.0)
Monocytes Absolute: 0.6 10*3/uL (ref 0.1–1.7)
Monocytes: 10.7 % (ref 3.0–15.0)
Neutrophils %: 64.1 % (ref 42.0–78.0)
Neutrophils Absolute: 3.8 10*3/uL (ref 1.7–8.6)
PLT CT: 194 10*3/uL (ref 130–440)
RBC: 3.71 10*6/uL — ABNORMAL LOW (ref 4.00–5.70)
RDW: 13.3 % (ref 11.0–14.0)
WBC: 6 10*3/uL (ref 4.0–11.0)

## 2013-04-08 LAB — CKMB
Creatinine Kinase MB (CKMB): 7.8 ng/mL — ABNORMAL HIGH (ref 0.1–6.0)
Creatinine Kinase MB (CKMB): 6.3 ng/mL — ABNORMAL HIGH (ref 0.1–6.0)

## 2013-04-08 LAB — TROPONIN I
Troponin I: 0.88 ng/mL (ref 0.00–0.02)
Troponin I: 0.77 ng/mL (ref 0.00–0.02)

## 2013-04-08 LAB — MAGNESIUM: Magnesium: 1.6 mg/dL (ref 1.6–2.6)

## 2013-04-08 LAB — PHOSPHORUS: Phosphorus: 2.9 mg/dL (ref 2.3–4.7)

## 2013-04-08 NOTE — Progress Notes (Signed)
Pt remains stable. Tele unchanged. Bedside report given to Dennie Fetters, RN

## 2013-04-08 NOTE — Progress Notes (Signed)
Pt ambulating in room without diff. Noted.

## 2013-04-08 NOTE — Progress Notes (Signed)
Care assumed of pt. Denies chest pain, dyspnea or other related discomfort. Normal Sinus Rhythm noted on monitor. Call bell within reach.

## 2013-04-08 NOTE — Progress Notes (Signed)
[name],[name]  04/08/2013  11:57 AM      Subjective:    Feeling well.  No complaints.      Scheduled Meds:  Current Facility-Administered Medications   Medication Dose Route Frequency   . [EXPIRED] aspirin       . amLODIPine  5 mg Oral Daily   . aspirin EC  324 mg Oral Daily   . atorvastatin  20 mg Oral QHS   . DULoxetine  30 mg Oral BID   . labetalol  200 mg Oral Q8H   . pantoprazole  40 mg Oral QAM AC     Continuous Infusions:     . lorazepam     . [DISCONTINUED] 0.9 % NaCl with KCl 20 mEq 150 mL/hr at 04/08/13 0407     PRN Meds:acetaminophen, labetalol, lorazepam, LORazepam, zolpidem    Objective:  Physical Exam:  General appearance - alert, well appearing, and in no distress  Neck - carotids upstroke normal bilaterally, no bruits  Chest - clear to auscultation, no wheezes, rales or rhonchi, symmetric air entry  Heart - systolic murmur 2/6 at 2nd left intercostal space  Extremities - no pedal edema noted, Mild hand edema    Cardiographics  Telemetry:  SInus rhythm.        Vitals  Filed Vitals:    04/08/13 0036 04/08/13 0508 04/08/13 0718 04/08/13 1152   BP: 118/72 134/77 144/87 137/84   Pulse: 74 84 74    Temp: 97.7 F (36.5 C) 98.2 F (36.8 C) 98.1 F (36.7 C) 98.1 F (36.7 C)   TempSrc: Oral Oral Oral Oral   Resp: 18 18 16 16    Height:       Weight:  119 kg (262 lb 5.6 oz)     SpO2: 95% 96% 96% 95%       Intake and Output    Intake/Output Summary (Last 24 hours) at 04/08/13 1157  Last data filed at 04/08/13 0508   Gross per 24 hour   Intake 3392.5 ml   Output    750 ml   Net 2642.5 ml       Lab Review   Lab Results   Component Value Date    NA 141 04/07/2013    K 3.8 04/07/2013    CL 111* 04/07/2013    CO2 25.0 04/07/2013    BUN 18 04/07/2013     Lab Results   Component Value Date    CKMB 6.3* 04/08/2013    CKMB 7.8* 04/08/2013    CKMB 10.7* 04/07/2013    CKMBINDEX 6.9* 04/07/2013    CKMBINDEX 7.6* 04/07/2013    CKMBINDEX NI 04/06/2013     Lab Results   Component Value Date    WBC 6.0 04/08/2013    HGB 11.3*  04/08/2013    HCT 32.7* 04/08/2013    MCV 88 04/08/2013    PLT 194 04/08/2013       CBC    Lab 04/08/13 0342   WBC 6.0   HGB 11.3*   HCT 32.7*   PLT 194         BMP    Lab 04/07/13 0420   NA 141   K 3.8   CL 111*   CO2 25.0   BUN 18   CREAT 1.08   EGFR >60   GLU 107*   CA 8.3*         Cardiac Enzymes    Lab 04/08/13 0604 04/08/13 0022 04/07/13 1852 04/07/13 1056 04/07/13 0420  04/06/13 1515   CK -- -- -- 208 242* 156   TROPI 0.77* 0.88* 1.09* -- -- --   TROPT -- -- -- -- -- --   CKMBINDEX -- -- -- 6.9* 7.6* NI         Assessment/Plan:     1) nonspecific troponin elevation.  2) NSVT, in the setting of hypokalemia.  3) Dehydration.  Creatinine normalized with IVF.  4) Prerenal Azotemia.  As above.      D/C IVF.  [employer]	[place].       LOS: 2 days

## 2013-04-08 NOTE — Progress Notes (Signed)
Care assumed of pt. Denies chest pain, dyspnea or other related discomfort. Normal Sinus Rhythm 80's -90's noted on monitor. Call bell within reach.   Rec'd critical value of Troponin 0,77 not called. Expected range and trending down.

## 2013-04-09 ENCOUNTER — Inpatient Hospital Stay: Payer: BC Managed Care – PPO

## 2013-04-09 ENCOUNTER — Encounter
Admission: EM | Disposition: A | Discharge status: Home / Self Care | Payer: Self-pay | Source: Home / Self Care | Attending: Cardiovascular Disease

## 2013-04-09 DIAGNOSIS — I469 Cardiac arrest, cause unspecified: Secondary | ICD-10-CM | POA: Diagnosis not present

## 2013-04-09 LAB — COMPREHENSIVE METABOLIC PANEL
ALT: 42 U/L (ref 0–55)
AST (SGOT): 56 U/L — ABNORMAL HIGH (ref 10–42)
Albumin/Globulin Ratio: 0.77 Ratio (ref 0.70–1.50)
Albumin: 2.3 gm/dL — ABNORMAL LOW (ref 3.5–5.0)
Alkaline Phosphatase: 90 U/L (ref 40–145)
Anion Gap: 15.7 mMol/L (ref 7.0–18.0)
BUN / Creatinine Ratio: 13.5 Ratio (ref 10.0–30.0)
BUN: 15 mg/dL (ref 7–22)
Bilirubin, Total: 0.3 mg/dL (ref 0.1–1.2)
CO2: 18.2 mMol/L — ABNORMAL LOW (ref 20.0–30.0)
Calcium: 8.3 mg/dL — ABNORMAL LOW (ref 8.5–10.5)
Chloride: 111 mMol/L — ABNORMAL HIGH (ref 98–110)
Creatinine: 1.11 mg/dL (ref 0.80–1.30)
EGFR: >60 mL/min/{1.73_m2}
Globulin: 3 gm/dL (ref 2.0–4.0)
Glucose: 125 mg/dL — ABNORMAL HIGH (ref 70–99)
Osmolality Calc: 280 mOsm/kg (ref 275–300)
Potassium: 5.9 mMol/L — ABNORMAL HIGH (ref 3.5–5.3)
Protein, Total: 5.3 gm/dL — ABNORMAL LOW (ref 6.0–8.3)
Sodium: 139 mMol/L (ref 136–147)

## 2013-04-09 LAB — CBC AND DIFFERENTIAL
Basophils %: 0.5 % (ref 0.0–3.0)
Basophils %: 0.8 % (ref 0.0–3.0)
Basophils Absolute: 0 10*3/uL (ref 0.0–0.3)
Basophils Absolute: 0.1 10*3/uL (ref 0.0–0.3)
Eosinophils %: 6 % (ref 0.0–7.0)
Eosinophils %: 3.1 % (ref 0.0–7.0)
Eosinophils Absolute: 0.4 10*3/uL (ref 0.0–0.8)
Eosinophils Absolute: 0.2 10*3/uL (ref 0.0–0.8)
Hematocrit: 33.8 % — ABNORMAL LOW (ref 39.0–52.5)
Hematocrit: 32.5 % — ABNORMAL LOW (ref 39.0–52.5)
Hemoglobin: 11.5 gm/dL — ABNORMAL LOW (ref 13.0–17.5)
Hemoglobin: 11.1 gm/dL — ABNORMAL LOW (ref 13.0–17.5)
Lymphocytes Absolute: 1.3 10*3/uL (ref 0.6–5.1)
Lymphocytes Absolute: 0.6 10*3/uL (ref 0.6–5.1)
Lymphocytes: 19.1 % (ref 15.0–46.0)
Lymphocytes: 10.1 % — ABNORMAL LOW (ref 15.0–46.0)
MCH: 30 pg (ref 28–35)
MCH: 30 pg (ref 28–35)
MCHC: 34 gm/dL (ref 32–36)
MCHC: 34 gm/dL (ref 32–36)
MCV: 89 fL (ref 80–100)
MCV: 88 fL (ref 80–100)
MPV: 8.5 fL (ref 6.0–10.0)
MPV: 8.1 fL (ref 6.0–10.0)
Monocytes Absolute: 0.7 10*3/uL (ref 0.1–1.7)
Monocytes Absolute: 0.3 10*3/uL (ref 0.1–1.7)
Monocytes: 10.4 % (ref 3.0–15.0)
Monocytes: 5.7 % (ref 3.0–15.0)
Neutrophils %: 63.9 % (ref 42.0–78.0)
Neutrophils %: 80.3 % — ABNORMAL HIGH (ref 42.0–78.0)
Neutrophils Absolute: 4.5 10*3/uL (ref 1.7–8.6)
Neutrophils Absolute: 4.9 10*3/uL (ref 1.7–8.6)
PLT CT: 195 10*3/uL (ref 130–440)
PLT CT: 220 10*3/uL (ref 130–440)
RBC: 3.81 10*6/uL — ABNORMAL LOW (ref 4.00–5.70)
RBC: 3.68 10*6/uL — ABNORMAL LOW (ref 4.00–5.70)
RDW: 13.1 % (ref 11.0–14.0)
RDW: 12.9 % (ref 11.0–14.0)
WBC: 7 10*3/uL (ref 4.0–11.0)
WBC: 6.1 10*3/uL (ref 4.0–11.0)

## 2013-04-09 LAB — BASIC METABOLIC PANEL
Anion Gap: 12.7 mMol/L (ref 7.0–18.0)
BUN / Creatinine Ratio: 16.8 Ratio (ref 10.0–30.0)
BUN: 17 mg/dL (ref 7–22)
CO2: 22.2 mMol/L (ref 20.0–30.0)
Calcium: 8.5 mg/dL (ref 8.5–10.5)
Chloride: 110 mMol/L (ref 98–110)
Creatinine: 1.01 mg/dL (ref 0.80–1.30)
EGFR: >60 mL/min/{1.73_m2}
Glucose: 109 mg/dL — ABNORMAL HIGH (ref 70–99)
Osmolality Calc: 283 mOsm/kg (ref 275–300)
Potassium: 3.9 mMol/L (ref 3.5–5.3)
Sodium: 141 mMol/L (ref 136–147)

## 2013-04-09 LAB — I-STAT CG4 ARTERIAL CARTRIDGE
BE, ISTAT: -9 mMol/L — ABNORMAL LOW (ref <=2)
BE, ISTAT: -3 mMol/L — ABNORMAL LOW (ref <=2)
HCO3, ISTAT: 19.7 mMol/L — ABNORMAL LOW (ref 20.0–29.0)
HCO3, ISTAT: 22.7 mMol/L (ref 20.0–29.0)
Lactic Acid I-Stat: 5.68 mMol/L (ref 0.5–2.1)
Lactic Acid I-Stat: 0.7 mMol/L (ref 0.5–2.1)
O2 Sat, %, ISTAT: 97 % (ref 96–100)
O2 Sat, %, ISTAT: 97 % (ref 96–100)
PCO2, ISTAT: 56.3 mm Hg — ABNORMAL HIGH (ref 35.0–45.0)
PCO2, ISTAT: 40.4 mm Hg (ref 35.0–45.0)
PO2, ISTAT: 120 mm Hg — ABNORMAL HIGH (ref 75–100)
PO2, ISTAT: 94 mm Hg (ref 75–100)
Rate, ISTAT: 1
Room Number I-Stat: 3536
Room Number I-Stat: 3536
TCO2 I-Stat: 21 mMol/L — ABNORMAL LOW (ref 24–29)
TCO2 I-Stat: 24 mMol/L (ref 24–29)
i-STAT FIO2: 100 %
i-STAT FIO2: 40 %
i-STAT Peep: 5
i-STAT Peep: 5
i-STAT Pressure Support: 7
pH, ISTAT: 7.15 — CL (ref 7.35–7.45)
pH, ISTAT: 7.36 (ref 7.35–7.45)

## 2013-04-09 LAB — CBC
Hematocrit: 31.8 % — ABNORMAL LOW (ref 39.0–52.5)
Hemoglobin: 10.3 gm/dL — ABNORMAL LOW (ref 13.0–17.5)
MCH: 29 pg (ref 28–35)
MCHC: 32 gm/dL (ref 32–36)
MCV: 91 fL (ref 80–100)
MPV: 10.7 fL — ABNORMAL HIGH (ref 6.0–10.0)
PLT CT: 92 10*3/uL — ABNORMAL LOW (ref 130–440)
RBC: 3.5 10*6/uL — ABNORMAL LOW (ref 4.00–5.70)
RDW: 21.6 % — ABNORMAL HIGH (ref 11.0–14.0)
WBC: 4.6 10*3/uL (ref 4.0–11.0)

## 2013-04-09 LAB — ECG 12-LEAD
P Wave Axis: 25 deg
P Wave Duration: 102 ms
P-R Interval: 154 ms
Patient Age: 67 years
Q-T Dispersion: 14 ms
Q-T Interval(Corrected): 439 ms
Q-T Interval: 344 ms
QRS Axis: 62 deg
QRS Duration: 88 ms
T Axis: 38 deg
Ventricular Rate: 98 /min

## 2013-04-09 LAB — TYPE AND SCREEN
AB Screen: NEGATIVE
ABO Rh: A POS

## 2013-04-09 LAB — D-DIMER, QUANTITATIVE: D-Dimer: 4.65 mg/L FEU — ABNORMAL HIGH (ref 0.19–0.52)

## 2013-04-09 LAB — PT AND APTT
PT INR: 1.1 (ref 0.5–1.3)
PT: 11.8 s (ref 9.5–12.4)
aPTT: 33.6 s (ref 24.0–34.0)

## 2013-04-09 LAB — TSH: TSH: 2.4 u[IU]/mL (ref 0.40–4.20)

## 2013-04-09 LAB — PHOSPHORUS: Phosphorus: 4 mg/dL (ref 2.3–4.7)

## 2013-04-09 LAB — MAGNESIUM
Magnesium: 1.4 mg/dL — ABNORMAL LOW (ref 1.6–2.6)
Magnesium: 1.7 mg/dL (ref 1.6–2.6)

## 2013-04-09 LAB — B-TYPE NATRIURETIC PEPTIDE: B-Natriuretic Peptide: 349.2 pg/mL — ABNORMAL HIGH (ref 0.0–100.0)

## 2013-04-09 SURGERY — LEFT HEART CATH POSS PCI W/ GRAFTS
Laterality: Left

## 2013-04-09 MED ORDER — HYDROCODONE-ACETAMINOPHEN 5-325 MG PO TABS
1.0000 | ORAL_TABLET | Freq: Four times a day (QID) | ORAL | Status: DC | PRN
Start: 2013-04-09 — End: 2013-04-11
  Administered 2013-04-09 – 2013-04-11 (×6): 1 via ORAL
  Filled 2013-04-09 (×6): qty 1

## 2013-04-09 MED ORDER — FENTANYL CITRATE 0.05 MG/ML IJ SOLN
50.00 ug | INTRAMUSCULAR | Status: AC | PRN
Start: 2013-04-09 — End: 2013-04-09
  Administered 2013-04-09: 50 ug via INTRAVENOUS
  Filled 2013-04-09: qty 2

## 2013-04-09 MED ORDER — VH HEPARIN (PORCINE) IN D5W 50-5 UNIT/ML-% IV SOLN
50.00 [IU]/h | INTRAVENOUS | Status: DC
Start: 2013-04-09 — End: 2013-04-10
  Administered 2013-04-09: 1000 [IU]/h via INTRAVENOUS
  Filled 2013-04-09: qty 500

## 2013-04-09 MED ORDER — MIDAZOLAM HCL 2 MG/2ML IJ SOLN
INTRAMUSCULAR | Status: AC
Start: 2013-04-09 — End: ?
  Filled 2013-04-09: qty 2

## 2013-04-09 MED ORDER — MIDAZOLAM HCL 2 MG/2ML IJ SOLN
INTRAMUSCULAR | Status: AC | PRN
Start: 2013-04-09 — End: 2013-04-09
  Administered 2013-04-09: 2 mg via INTRAVENOUS

## 2013-04-09 MED ORDER — VH NOREPINEPHRINE INFUSION 8 MG/250 ML D5W (SIMPLE)
INTRAVENOUS | Status: DC
Start: 2013-04-09 — End: 2013-04-09
  Filled 2013-04-09: qty 250

## 2013-04-09 MED ORDER — VERAPAMIL HCL 2.5 MG/ML IV SOLN
INTRAVENOUS | Status: AC
Start: 2013-04-09 — End: 2013-04-09
  Administered 2013-04-09: 2.5 mg via INTRAVENOUS
  Filled 2013-04-09: qty 2

## 2013-04-09 MED ORDER — MIDAZOLAM HCL 2 MG/2ML IJ SOLN
INTRAMUSCULAR | Status: AC
Start: 2013-04-09 — End: 2013-04-09
  Administered 2013-04-09: 2 mg via INTRAVENOUS
  Filled 2013-04-09: qty 2

## 2013-04-09 MED ORDER — TECHNETIUM TC 99M SESTAMIBI - CARDIOLITE
16.50 | Freq: Once | Status: AC | PRN
Start: 2013-04-09 — End: 2013-04-09
  Administered 2013-04-09: 16.5 via INTRAVENOUS

## 2013-04-09 MED ORDER — EPINEPHRINE HCL 0.1 MG/ML IJ SOLN
INTRAMUSCULAR | Status: AC | PRN
Start: 2013-04-09 — End: 2013-04-09
  Administered 2013-04-09: 1 mg via INTRAVENOUS

## 2013-04-09 MED ORDER — LIDOCAINE HCL 1 % IJ SOLN
INTRAMUSCULAR | Status: AC
Start: 2013-04-09 — End: 2013-04-09
  Administered 2013-04-09: 10 mL via SUBCUTANEOUS
  Filled 2013-04-09: qty 20

## 2013-04-09 MED ORDER — IOHEXOL 350 MG/ML IV SOLN
100.0000 mL | Freq: Once | INTRAVENOUS | Status: AC | PRN
Start: 2013-04-09 — End: 2013-04-09
  Administered 2013-04-09: 100 mL via INTRAVENOUS

## 2013-04-09 MED ORDER — ATROPINE SULFATE 0.1 MG/ML IJ SOLN
INTRAMUSCULAR | Status: AC
Start: 2013-04-09 — End: ?
  Filled 2013-04-09: qty 10

## 2013-04-09 MED ORDER — IODIXANOL 320 MG/ML IV SOLN
80.0000 mL | Freq: Once | INTRAVENOUS | Status: DC
Start: 2013-04-09 — End: 2013-04-11

## 2013-04-09 MED ORDER — VH NOREPINEPHRINE INFUSION 8 MG/250 ML D5W (SIMPLE)
1.00 ug/min | INTRAVENOUS | Status: DC
Start: 2013-04-09 — End: 2013-04-09

## 2013-04-09 MED ORDER — TECHNETIUM TC 99M SESTAMIBI - CARDIOLITE
49.5000 | Freq: Once | Status: AC | PRN
Start: 2013-04-09 — End: 2013-04-09
  Administered 2013-04-09: 49.5 via INTRAVENOUS

## 2013-04-09 MED ORDER — MAGNESIUM SULFATE IN D5W 10-5 MG/ML-% IV SOLN
1.0000 g | INTRAVENOUS | Status: DC | PRN
Start: 2013-04-09 — End: 2013-04-12

## 2013-04-09 MED ORDER — MIDAZOLAM HCL 2 MG/2ML IJ SOLN
2.5000 mg | INTRAMUSCULAR | Status: AC | PRN
Start: 2013-04-09 — End: 2013-04-09

## 2013-04-09 MED ORDER — VH MIDAZOLAM INFUSION 100 MG/100 ML NS (SIMPLE)
0.00 mg/h | INTRAVENOUS | Status: DC
Start: 2013-04-09 — End: 2013-04-09
  Filled 2013-04-09: qty 100

## 2013-04-09 MED ORDER — FENTANYL CITRATE 0.05 MG/ML IJ SOLN
INTRAMUSCULAR | Status: AC
Start: 2013-04-09 — End: 2013-04-09
  Administered 2013-04-09: 25 ug via INTRAVENOUS
  Filled 2013-04-09: qty 2

## 2013-04-09 MED ORDER — HEPARIN (PORCINE) IN NACL 2-0.9 UNIT/ML-% IJ SOLN
INTRAMUSCULAR | Status: AC
Start: 2013-04-09 — End: 2013-04-09
  Filled 2013-04-09: qty 500

## 2013-04-09 MED ORDER — MIDAZOLAM HCL 2 MG/2ML IJ SOLN
2.5000 mg | INTRAMUSCULAR | Status: DC | PRN
Start: 2013-04-09 — End: 2013-04-09

## 2013-04-09 MED ORDER — VH FENTANYL 10 MCG/ML INFUSION 100 ML NS (SIMPLE)
0.0000 ug/h | INTRAVENOUS | Status: DC
Start: 2013-04-09 — End: 2013-04-09

## 2013-04-09 MED ORDER — ALBUTEROL-IPRATROPIUM 2.5-0.5 (3) MG/3ML IN SOLN
RESPIRATORY_TRACT | Status: AC
Start: 2013-04-09 — End: ?
  Filled 2013-04-09: qty 3

## 2013-04-09 MED ORDER — FENTANYL CITRATE 0.05 MG/ML IJ SOLN
50.0000 ug | INTRAMUSCULAR | Status: DC | PRN
Start: 2013-04-09 — End: 2013-04-09
  Administered 2013-04-09: 50 ug via INTRAVENOUS

## 2013-04-09 MED ORDER — VH HEPARIN 10,000 UNITS/1000 ML NS
INJECTION | INTRAVENOUS | Status: AC
Start: 2013-04-09 — End: 2013-04-09
  Filled 2013-04-09: qty 1000

## 2013-04-09 MED ORDER — SODIUM CHLORIDE 0.9 % IV BOLUS
1000.0000 mL | Freq: Once | INTRAVENOUS | Status: AC
Start: 2013-04-09 — End: 2013-04-09
  Administered 2013-04-09: 1000 mL via INTRAVENOUS

## 2013-04-09 MED ORDER — ATROPINE SULFATE 0.1 MG/ML IJ SOLN
INTRAMUSCULAR | Status: AC | PRN
Start: 2013-04-09 — End: 2013-04-09
  Administered 2013-04-09: 1 mg via INTRAVENOUS

## 2013-04-09 MED ORDER — VH HEPARIN SODIUM (PORCINE) 10000 UNIT/ML IJ SOLN
1000.0000 [IU] | INTRAMUSCULAR | Status: DC | PRN
Start: 2013-04-09 — End: 2013-04-10
  Administered 2013-04-09: 4000 [IU] via INTRAVENOUS
  Filled 2013-04-09: qty 0.4
  Filled 2013-04-09: qty 1

## 2013-04-09 MED ORDER — VH FENTANYL 10 MCG/ML INFUSION 100 ML NS (SIMPLE)
INTRAVENOUS | Status: AC
Start: 2013-04-09 — End: 2013-04-10
  Filled 2013-04-09: qty 100

## 2013-04-09 MED ORDER — SODIUM PHOSPHATE 3 MMOLE/ML IV SOLN
23.0000 mmol | INTRAVENOUS | Status: DC | PRN
Start: 2013-04-09 — End: 2013-04-12

## 2013-04-09 MED ORDER — HEPARIN SODIUM (PORCINE) 1000 UNIT/ML IJ SOLN
INTRAMUSCULAR | Status: AC
Start: 2013-04-09 — End: 2013-04-09
  Administered 2013-04-09: 4000 mL via INTRAVENOUS
  Filled 2013-04-09: qty 10

## 2013-04-09 MED ORDER — POTASSIUM CHLORIDE 20 MEQ/100ML IV SOLN
20.0000 meq | INTRAVENOUS | Status: DC | PRN
Start: 2013-04-09 — End: 2013-04-12

## 2013-04-09 MED ORDER — CALCIUM GLUCONATE 10 % IV SOLN
1.50 g | INTRAVENOUS | Status: DC | PRN
Start: 2013-04-09 — End: 2013-04-12

## 2013-04-09 NOTE — Progress Notes (Signed)
Pt having ETT in Nuc card. EKG changed to SB and then PEA at end of test. ACLS initiated. Code Team responded. See also Code Doc summary.  Mazi Brailsford L Landry Lookingbill  04/09/2013  1134

## 2013-04-09 NOTE — Progress Notes (Signed)
Nuclear stress test - 5:00 min per bruce protocol; 7.0; 85%thr; no cp or st changes with exercise; during last 30 seconds of test, converted to rbbb with hr decrease; then pt began seizure-like activity and became unresponsive; code called and cpr began, atropine given; pea with brady rate for approximately 6 minutes then hr returned at sinus tach, pt still unresponsive with agonal breathing; please see code documentation for all medications given; sestamibi images pending; rest: 16.5  Stress: [address] [place] [address]

## 2013-04-09 NOTE — Procedures (Signed)
[facility]  MSET Documentation    TIME MSET STARTED:  See MSET nursing documentation    TIME MSET ENDED:  See MSET nursing documentation    INITIAL RHYTHM: PEA    PATIENT INTUBATED: BY INTENSIVIST    LINES PLACED: peripheral IV    DRUGS ADMINISTERED: EPINEPHRINE and ATROPINE    ELECTRICAL THERAPY USED: NOT INDICATED    FINAL RHYTHM:  Sinus tachycardia    VASOPRESSORS: NONE INDICATED    FINAL PATIENT STATUS: SUCCESSFULL ROSC    DESCRIPTION OF EVENTS: Huston Foley to PEA arrest at end of treadmill test . CPR on arrival, agonla respirations. Difficuly airay but successfully intubated and BMV adequate prior to intubation. Dr. Pecola Leisure at code and facitlitated plan for cardiac cath. CT had neg for bleed.  To ICU critical       Signed by: Elba Barman, MD   Date/Time: 04/09/2013 12:14 PM

## 2013-04-09 NOTE — Progress Notes (Signed)
Patient is resting quietly, only complaint is chest pain from compressions.

## 2013-04-09 NOTE — Progress Notes (Addendum)
Brief Cath Note  Full report dictated and will follow.     Patient Name: Sean [name]   Date of Birth: 25-Dec-1945  Date of Service: 04/09/2013     Procedure performed: coronary angiogram with LHC  Indication: cardiac arrest  Access: Right Radial  Closure: TR band  Anticoagulants: Heparin  Antiplatelets: Aspirin     Findings:  Coronaries  Left Main: No obstructive disease.  LAD: No obstructive disease.  LCX: No obstructive disease.   RCA: No obstructive disease. Grafts   N/A       LV:  Ejection Fraction: 55%  LV EDP: RHC: (pressures in mmHg)  N/A     Impression:    No obstructive CAD. There is mild 20-30% disease in the proximal and mid LAD. Otherwise only mild luminal irregularities. Elevated LVEDP possibly related to aggressive fluid resuscitation; LVEF appears grossly normal.    Mild troponin elevation with transient conduction abnormalities, syncopal event, no significant CAD. Consider infectious/inflammatory cardiomyopathy?    Gracelynne Benedict Melnick, MD  [facility]  [address], [address]  [place], [place] [address]  [phone]

## 2013-04-09 NOTE — Progress Notes (Signed)
Pt transferred to unit post cardiac arrest during stress test. Emergent heart  cath performed.  No intervention required. Intubated/vented.  Alert and approp.  Working toward extubation. Spouse at bedside. Currently no d/c needs identified.

## 2013-04-09 NOTE — Procedures (Signed)
Sean [name] is a 67 y.o. male patient.  Principal Problem:   *Acute kidney injury  Active Problems:   Syncope   Hypokalemia   Lactic acid acidosis   Dehydration   Ventricular tachycardia   Myocardial infarct    Past Medical History   Diagnosis Date   . Hypertension    . GERD (gastroesophageal reflux disease)    . Gout    . Hyperlipemia    . Hx of radical prostatectomy    . Hydrocele      Blood pressure 150/93, pulse 83, temperature 98.1 F (36.7 C), temperature source Oral, resp. rate 17, height 1.778 m (5\' 10" ), weight 119 kg (262 lb 5.6 oz), SpO2 94.00%.    Intubation  Date/Time: 04/09/2013 11:48 AM  Performed by: Elba Barman  Authorized by: Elba Barman  Consent: Verbal consent not obtained. Written consent not obtained. The procedure was performed in an emergent situation.  Patient identity confirmed: arm band  Indications: cardiac arrest.  Intubation method: fiberoptic oral  Patient status: unconscious  Preoxygenation: BVM  Pretreatment medications: midazolam  Tube size: 7.0 mm  Tube type: cuffed  Number of attempts: 3  Ventilation between attempts: BVM  Cricoid pressure: no  Cords visualized: yes  Post-procedure assessment: chest rise and CO2 detector  Breath sounds: equal  Cuff inflated: yes  ETT to lip: 25 cm  Tube secured with: ETT holder  Patient tolerance: Patient tolerated the procedure well with no immediate complications.  Comments: Intubated emergently in stress test lab.  To Ct fror emergency head CT r/o bleed        [name]  04/09/2013

## 2013-04-09 NOTE — Progress Notes (Signed)
Walked by room and patient was up at the foot of the bed trying to go to the bathroom. I escorted him back into bed and got him situated and asked why he got up. He told me that he needed to go to the restroom but when he looked down he realized that he had a foley catheter. I explained to him what happened and that after he arrested he went immediately to get a head CT and then to the cardiac cath lab. He said he had no memory of this and that he was extremely sorry for trying to get up. I told him that he was on strict bedrest and that for now he needs to stay in bed. He said that he will not get up again and I turned the bed exit alarm on. Will continue to monitor.

## 2013-04-09 NOTE — Progress Notes (Signed)
Report given to Lauren,RN. Pt without distress. VSS

## 2013-04-09 NOTE — Progress Notes (Signed)
Pt arrived to unit from stress testing post cardiac arrest. Pt intubated, bagged. Pt not following commands. Foley, OG tube inserted.

## 2013-04-09 NOTE — Progress Notes (Signed)
[facility] RAPID RESPONSE INITIAL EVALUATION    Sean [name] is a  67 y.o.  male admitted 04/06/2013 with .  The Rapid Response Team was activated  on 04/09/2013 for MSET. Vital signs are:   Temperature 98.1 F (36.7 C) (Oral), heart rate  130, blood pressure unobtainable, respirations 16 assisted with BMV, pulse ox 100% on vent.       Received MSET page at 1120, arrived in stress testing with code team. Assisted in performing ACLS protocol. See code documentation for further details. Pt brought to ED CT scan then 3536 with 3 critical care RN, RT and intensivist MD. Care of pt handed to ICU team.     The patients' status worsened, pt transferred to ICU.      [name] , RN

## 2013-04-09 NOTE — Code Documentation (Signed)
Sean [name], Exercise Physiologist , in room

## 2013-04-09 NOTE — Progress Notes (Signed)
Dr. Manson Passey in and placed new orders.

## 2013-04-09 NOTE — Code Documentation (Signed)
Pt in Nuclear Cardiology for exercise stress test. Pt completed 5 mins on treadmill. No c/o chest pain. No ST changes. EKG changed to RBBB at peak exercise. Pt became very weak, pale, and incontinent while standing on treadmill. Pt escorted to chair by 3 staff members. Pt became unresponsive. Sinus brady initially, then PEA. Compressions started  Immediately and pt bagged with with ambu bag. Oral airway inserted. Code Team called.

## 2013-04-09 NOTE — Progress Notes (Signed)
Platlet level went from 240 to 92, Heparin on hold Dr. Dorothe Pea paged
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
## 2013-04-09 NOTE — Progress Notes (Signed)
Per Dr. Dorothe Pea, continue heparin at 1000 units and redraw platlets in four hours. If platlets are less than 90, page Dr. Dorothe Pea and order a manual platlet count.

## 2013-04-10 LAB — PHOSPHORUS
Phosphorus: 3.7 mg/dL (ref 2.3–4.7)
Phosphorus: 3.3 mg/dL (ref 2.3–4.7)

## 2013-04-10 LAB — CBC AND DIFFERENTIAL
Basophils %: 0 % (ref 0.0–3.0)
Basophils Absolute: 0 10*3/uL (ref 0.0–0.3)
Eosinophils %: 2 % (ref 0.0–7.0)
Eosinophils Absolute: 0.1 10*3/uL (ref 0.0–0.8)
Hematocrit: 31.7 % — ABNORMAL LOW (ref 39.0–52.5)
Hemoglobin: 11 gm/dL — ABNORMAL LOW (ref 13.0–17.5)
Lymphocytes Absolute: 1.2 10*3/uL (ref 0.6–5.1)
Lymphocytes: 18.1 % (ref 15.0–46.0)
MCH: 30 pg (ref 28–35)
MCHC: 35 gm/dL (ref 32–36)
MCV: 87 fL (ref 80–100)
MPV: 8.2 fL (ref 6.0–10.0)
Monocytes Absolute: 0.8 10*3/uL (ref 0.1–1.7)
Monocytes: 11.9 % (ref 3.0–15.0)
Neutrophils %: 67.9 % (ref 42.0–78.0)
Neutrophils Absolute: 4.5 10*3/uL (ref 1.7–8.6)
PLT CT: 224 10*3/uL (ref 130–440)
RBC: 3.63 10*6/uL — ABNORMAL LOW (ref 4.00–5.70)
RDW: 13.2 % (ref 11.0–14.0)
WBC: 6.6 10*3/uL (ref 4.0–11.0)

## 2013-04-10 LAB — MAGNESIUM
Magnesium: 1.6 mg/dL (ref 1.6–2.6)
Magnesium: 1.6 mg/dL (ref 1.6–2.6)

## 2013-04-10 LAB — CBC
Hematocrit: 35.6 % — ABNORMAL LOW (ref 39.0–52.5)
Hemoglobin: 12.1 gm/dL — ABNORMAL LOW (ref 13.0–17.5)
MCH: 30 pg (ref 28–35)
MCHC: 34 gm/dL (ref 32–36)
MCV: 88 fL (ref 80–100)
MPV: 8 fL (ref 6.0–10.0)
PLT CT: 220 10*3/uL (ref 130–440)
RBC: 4.05 10*6/uL (ref 4.00–5.70)
RDW: 13.3 % (ref 11.0–14.0)
WBC: 6 10*3/uL (ref 4.0–11.0)

## 2013-04-10 LAB — CALCIUM, IONIZED: Calcium, Ionized: 4.69 mg/dL (ref 4.35–5.10)

## 2013-04-10 MED ORDER — FUROSEMIDE 10 MG/ML IJ SOLN
60.0000 mg | Freq: Once | INTRAMUSCULAR | Status: AC
Start: 2013-04-10 — End: 2013-04-10
  Administered 2013-04-10: 60 mg via INTRAVENOUS

## 2013-04-10 MED ORDER — CEFAZOLIN SODIUM-DEXTROSE 2-3 GM-% IV SOLR
2.0000 g | INTRAVENOUS | Status: DC
Start: 2013-04-11 — End: 2013-04-11

## 2013-04-10 MED ORDER — FUROSEMIDE 10 MG/ML IJ SOLN
INTRAMUSCULAR | Status: AC
Start: 2013-04-10 — End: 2013-04-11
  Filled 2013-04-10: qty 10

## 2013-04-10 MED ORDER — MAGNESIUM OXIDE 400 MG PO TABS
800.0000 mg | ORAL_TABLET | Freq: Every day | ORAL | Status: AC
Start: 2013-04-10 — End: 2013-04-10
  Administered 2013-04-10: 800 mg via ORAL
  Filled 2013-04-10: qty 2

## 2013-04-10 MED ORDER — CEFAZOLIN SODIUM-DEXTROSE 2-3 GM-% IV SOLR
2.0000 g | Freq: Once | INTRAVENOUS | Status: DC
Start: 2013-04-10 — End: 2013-04-10

## 2013-04-10 MED ORDER — SODIUM CHLORIDE 0.9 % IV SOLN
INTRAVENOUS | Status: DC
Start: 2013-04-11 — End: 2013-04-11

## 2013-04-10 MED ORDER — ENOXAPARIN SODIUM 40 MG/0.4ML SC SOLN
40.0000 mg | Freq: Every morning | SUBCUTANEOUS | Status: DC
Start: 2013-04-10 — End: 2013-04-12
  Administered 2013-04-10 – 2013-04-12 (×2): 40 mg via SUBCUTANEOUS
  Filled 2013-04-10 (×3): qty 0.4

## 2013-04-10 NOTE — Progress Notes (Signed)
Platelets back up to 224.

## 2013-04-10 NOTE — Progress Notes (Signed)
Cardiology Progress Note      Date Time: 04/10/2013 7:32 PM  Patient Name: Sean [name], Sean [name] DOB:  [birth year]  Attending:  Rudean Curt, MD    Subjective:   No new complaints. CTA of chest neg for pulmonary emboli. Moderate sized pleural effusions. Hemoglobin down so I suspect he is fluid overloaded.      Physical Exam:       Temp:  [98.3 F (36.8 C)-98.6 F (37 C)] 98.6 F (37 C)  Heart Rate:  [76-91] 80   Resp Rate:  [10-27] 14   BP: (116-154)/(59-86) 145/65 mmHg  Temp (24hrs), Avg:98.5 F (36.9 C), Min:98.3 F (36.8 C), Max:98.6 F (37 C)    117.7 kg (259 lb 7.7 oz)   Intake and Output Summary (Last 24 hours) at Date Time    Intake/Output Summary (Last 24 hours) at 04/10/13 1932  Last data filed at 04/10/13 1800   Gross per 24 hour   Intake   1400 ml   Output   2890 ml   Net  -1490 ml       Cardiovascular: Regular rate and rhythm; Normal S1 and S2 without murmurs, rubs, or gallops.  Pulmonary:Resonant to percussion and clear to auscultation; Equal expansion with out labored respiration   Extremities:No cyanosis, clubbing, or edema  Skin: Warm and dry    Medications:        . lorazepam     . [DISCONTINUED] heparin 19147 units in dextrose 5% 500 mL 1,000 Units/hr (04/09/13 2216)     Current Facility-Administered Medications   Medication Dose Route Frequency   . amLODIPine  5 mg Oral Daily   . aspirin EC  324 mg Oral Daily   . atorvastatin  20 mg Oral QHS   . DULoxetine  30 mg Oral BID   . enoxaparin  40 mg Subcutaneous QAM   . [EXPIRED] fentaNYL       . iodixanol  80 mL Intra-arterial Once   . [COMPLETED] magnesium oxide  800 mg Oral Daily   . pantoprazole  40 mg Oral QAM AC   . [DISCONTINUED] labetalol  200 mg Oral Q8H       Labs:       Lab 04/10/13 0438   WBC 6.6   HGB 11.0*   HCT 31.7*   PLT 224         Lab 04/10/13 0438 04/09/13 1233 04/09/13 0406 04/07/13 0420   GLU -- 125* -- --   BUN -- 15 17 18    CREAT -- 1.11 1.01 1.08   NA -- 139 -- --   K -- 5.9* -- --   CL -- 111* -- --   CO2 -- 18.2* -- --   MG  1.6 -- -- --   AST -- 56* -- --   ALT -- 42 -- --   TSH -- 2.40 -- --       No results found for this basename: CHOL,TRIG,HDL,LDL in the last 168 hours      Lab 04/08/13 0604 04/08/13 0022 04/07/13 1852 04/07/13 1056 04/07/13 0420 04/06/13 1515   TROPONIN -- -- -- -- -- --   ISTATTROPONI -- -- -- -- -- --   CK -- -- -- 208 242* 156   CKMB 6.3* 7.8* 10.7* -- -- --         EKG: none on chart    Telemetry: normal sinus rhythm with normal intervals        Impression and Plan:  Periodic heart block   Pacer in am  Diurese  Tonight  Lytes plus in am

## 2013-04-10 NOTE — Progress Notes (Signed)
Patient down to CT, Dr. Grace Isaac called back immediately and said it was negative for PE but multiple pleural effusions were present. Dr. Dorothe Pea d/c'd heparin drip and ordered Lovenox daily to start at 0900.

## 2013-04-10 NOTE — Progress Notes (Signed)
[facility]        Patient Name: Sean [name], Sean [name]  Date/Time: 04/10/2013 8:03 AM    Primary Care Physician: Parks Ranger, MD  Location/Room: 3536/3536-A    Treatment Team: Treatment Team:   Attending Provider: Rudean Curt, MD  Consulting Physician: Rudean Curt, MD  Consulting Physician: Sheran Fava, MD  Consulting Physician: Cristal Deer, MD  Consulting Physician: Roger Kill, MD  Consulting Physician: Jairo Ben, MD  Consulting Physician: Petra Kuba, MD  Consulting Physician: Virl Cagey, MD  Consulting Physician: Lawerance Bach, DO  Consulting Physician: Elba Barman, MD  Consulting Physician: Hulan Fess, DO    Diagnoses:     Active Hospital Problems    Diagnosis   . Cardiac arrest     Priority: High   . Acute respiratory failure     Priority: High   . Myocardial infarct   . Syncope   . Hypokalemia   . Lactic acid acidosis   . Acute kidney injury   . Dehydration   . Cardiac asystole       Brief Summary:   Extubated last afternoon and no new issues overnight. He feels well.   Labs reviewed and are remarkable only for low normal magnesium (will replace).  CT neg for PE and has bilateral effusions with compressive atelectasis - left. - would mobilize patient and consider a small dose of diuretic.    No critical care issues at this point and will sign off but am available if needed    T. Metta Clines MD

## 2013-04-10 NOTE — Plan of Care (Signed)
Vss, No chest pain or sob at this time, family updated, will continue to monitor. 04/10/2013  9:07 AM  Ephriam Jenkins

## 2013-04-11 ENCOUNTER — Inpatient Hospital Stay: Payer: BC Managed Care – PPO

## 2013-04-11 ENCOUNTER — Encounter
Admission: EM | Disposition: A | Discharge status: Home / Self Care | Payer: Self-pay | Source: Home / Self Care | Attending: Cardiovascular Disease

## 2013-04-11 DIAGNOSIS — Z950 Presence of cardiac pacemaker: Secondary | ICD-10-CM

## 2013-04-11 HISTORY — DX: Presence of cardiac pacemaker: Z95.0

## 2013-04-11 LAB — PHOSPHORUS
Phosphorus: 4.3 mg/dL (ref 2.3–4.7)
Phosphorus: 3.6 mg/dL (ref 2.3–4.7)

## 2013-04-11 LAB — CBC AND DIFFERENTIAL
Basophils %: 0.3 % (ref 0.0–3.0)
Basophils Absolute: 0 10*3/uL (ref 0.0–0.3)
Eosinophils %: 2.6 % (ref 0.0–7.0)
Eosinophils Absolute: 0.2 10*3/uL (ref 0.0–0.8)
Hematocrit: 35.5 % — ABNORMAL LOW (ref 39.0–52.5)
Hemoglobin: 12.4 gm/dL — ABNORMAL LOW (ref 13.0–17.5)
Lymphocytes Absolute: 1 10*3/uL (ref 0.6–5.1)
Lymphocytes: 15.2 % (ref 15.0–46.0)
MCH: 31 pg (ref 28–35)
MCHC: 35 gm/dL (ref 32–36)
MCV: 88 fL (ref 80–100)
MPV: 8.2 fL (ref 6.0–10.0)
Monocytes Absolute: 0.6 10*3/uL (ref 0.1–1.7)
Monocytes: 9.5 % (ref 3.0–15.0)
Neutrophils %: 72.4 % (ref 42.0–78.0)
Neutrophils Absolute: 4.6 10*3/uL (ref 1.7–8.6)
PLT CT: 261 10*3/uL (ref 130–440)
RBC: 4.05 10*6/uL (ref 4.00–5.70)
RDW: 13.5 % (ref 11.0–14.0)
WBC: 6.3 10*3/uL (ref 4.0–11.0)

## 2013-04-11 LAB — ECG 12-LEAD
P Wave Axis: 49 deg
P Wave Axis: 45 deg
P Wave Duration: 94 ms
P Wave Duration: 104 ms
P-R Interval: 144 ms
P-R Interval: 146 ms
Patient Age: 67 years
Patient Age: 67 years
Q-T Dispersion: 34 ms
Q-T Dispersion: 24 ms
Q-T Interval(Corrected): 440 ms
Q-T Interval(Corrected): 443 ms
Q-T Interval: 372 ms
Q-T Interval: 364 ms
QRS Axis: 47 deg
QRS Axis: 37 deg
QRS Duration: 94 ms
QRS Duration: 88 ms
T Axis: 43 deg
T Axis: 37 deg
Ventricular Rate: 84 /min
Ventricular Rate: 89 /min

## 2013-04-11 LAB — MAGNESIUM
Magnesium: 1.4 mg/dL — ABNORMAL LOW (ref 1.6–2.6)
Magnesium: 1.8 mg/dL (ref 1.6–2.6)

## 2013-04-11 LAB — CALCIUM, IONIZED: Calcium, Ionized: 4.73 mg/dL (ref 4.35–5.10)

## 2013-04-11 LAB — LYME AB, TOTAL,REFLEX TO WESTERN BLOT (IGG & IGM): Lyme AB: NONREACTIVE

## 2013-04-11 SURGERY — PM IMPLANT DUAL
Anesthesia: Local | Site: Chest | Laterality: Left

## 2013-04-11 MED ORDER — MIDAZOLAM HCL 2 MG/2ML IJ SOLN
INTRAMUSCULAR | Status: AC
Start: 2013-04-11 — End: 2013-04-12
  Filled 2013-04-11: qty 2

## 2013-04-11 MED ORDER — BACITRACIN 50000 UNITS IM SOLR
Freq: Once | INTRAMUSCULAR | Status: DC
Start: 2013-04-11 — End: 2013-04-11
  Filled 2013-04-11: qty 25000

## 2013-04-11 MED ORDER — MIDAZOLAM HCL 2 MG/2ML IJ SOLN
INTRAMUSCULAR | Status: AC
Start: 2013-04-11 — End: 2013-04-11
  Administered 2013-04-11: 2 mg via INTRAVENOUS
  Filled 2013-04-11: qty 2

## 2013-04-11 MED ORDER — BACITRACIN 50000 UNITS IM SOLR
25000.0000 [IU] | Freq: Once | INTRAMUSCULAR | Status: DC
Start: 2013-04-11 — End: 2013-04-11
  Filled 2013-04-11: qty 25000

## 2013-04-11 MED ORDER — HEPARIN (PORCINE) IN NACL 2-0.9 UNIT/ML-% IJ SOLN
INTRAMUSCULAR | Status: AC
Start: 2013-04-11 — End: 2013-04-11
  Filled 2013-04-11: qty 500

## 2013-04-11 MED ORDER — FENTANYL CITRATE 0.05 MG/ML IJ SOLN
INTRAMUSCULAR | Status: AC
Start: 2013-04-11 — End: 2013-04-11
  Administered 2013-04-11: 100 ug via INTRAVENOUS
  Filled 2013-04-11: qty 2

## 2013-04-11 MED ORDER — MAGNESIUM SULFATE IN D5W 10-5 MG/ML-% IV SOLN
1.0000 g | INTRAVENOUS | Status: AC
Start: 2013-04-11 — End: 2013-04-11
  Administered 2013-04-11 (×2): 1 g via INTRAVENOUS
  Filled 2013-04-11 (×2): qty 100

## 2013-04-11 MED ORDER — CEFAZOLIN SODIUM 1 G IJ SOLR
INTRAMUSCULAR | Status: AC
Start: 2013-04-11 — End: 2013-04-11
  Administered 2013-04-11: 2 g via INTRAVENOUS
  Filled 2013-04-11: qty 2000

## 2013-04-11 MED ORDER — BACITRACIN ZINC 500 UNIT/GM EX OINT
TOPICAL_OINTMENT | CUTANEOUS | Status: AC
Start: 2013-04-11 — End: 2013-04-12
  Filled 2013-04-11: qty 1

## 2013-04-11 MED ORDER — VH MAGNESIUM SULFATE 2 G IN 50 ML IV PREMIX
2.00 g | Freq: Once | INTRAVENOUS | Status: DC
Start: 2013-04-11 — End: 2013-04-11

## 2013-04-11 MED ORDER — HYDROCODONE-ACETAMINOPHEN 10-325 MG PO TABS
1.0000 | ORAL_TABLET | Freq: Four times a day (QID) | ORAL | Status: DC | PRN
Start: 2013-04-11 — End: 2013-04-12
  Administered 2013-04-11 – 2013-04-12 (×3): 1 via ORAL
  Filled 2013-04-11 (×3): qty 1

## 2013-04-11 MED ORDER — SODIUM CHLORIDE 0.9 % IV MBP
1.0000 g | Freq: Three times a day (TID) | INTRAVENOUS | Status: AC
Start: 2013-04-12 — End: 2013-04-12
  Administered 2013-04-12 (×2): 1 g via INTRAVENOUS
  Filled 2013-04-11 (×2): qty 1000

## 2013-04-11 MED ORDER — LIDOCAINE HCL 1 % IJ SOLN
INTRAMUSCULAR | Status: AC
Start: 2013-04-11 — End: 2013-04-11
  Administered 2013-04-11: 10 mL via SUBCUTANEOUS
  Filled 2013-04-11: qty 40

## 2013-04-11 MED ORDER — IODIXANOL 320 MG/ML IV SOLN
20.0000 mL | Freq: Once | INTRAVENOUS | Status: DC
Start: 2013-04-11 — End: 2013-04-11

## 2013-04-11 NOTE — Code Documentation (Signed)
BVM ventilation by RT, jaw thrust maneuver used due to clenching of teeth and stridorous respiratory effort.

## 2013-04-11 NOTE — Progress Notes (Signed)
Patient back from receiving a pacemaker from cath lab. Tolerated procedure well. Vss. Will continue to monitor. Sean [name]  7:20 PM  04/11/2013

## 2013-04-11 NOTE — Progress Notes (Signed)
Cardiology Progress Note      Date Time: 04/11/2013 10:10 PM  Patient Name: Sean [name], Sean [name] DOB:  1945/10/19  Attending:  Rudean Curt, MD    Subjective:   Chest sore from CPR and pacer today. Mag low at 1.4. Diuresed well during night.      Physical Exam:       Temp:  [97.8 F (36.6 C)-98.6 F (37 C)] 97.8 F (36.6 C)  Heart Rate:  [78-104] 91   Resp Rate:  [11-31] 19   BP: (107-170)/(56-124) 158/104 mmHg  Temp (24hrs), Avg:98.1 F (36.7 C), Min:97.8 F (36.6 C), Max:98.6 F (37 C)    114.6 kg (252 lb 10.4 oz)   Intake and Output Summary (Last 24 hours) at Date Time    Intake/Output Summary (Last 24 hours) at 04/11/13 2210  Last data filed at 04/11/13 1422   Gross per 24 hour   Intake 321.25 ml   Output   1830 ml   Net -1508.75 ml       Cardiovascular: Regular rate and rhythm; Normal S1 and S2 without murmurs, rubs, or gallops.  Pulmonary:Resonant to percussion and clear to auscultation; Equal expansion with out labored respiration   Extremities:No cyanosis, clubbing, or edema  Skin: Warm and dry    Medications:        . lorazepam     . [DISCONTINUED] sodium chloride 75 mL/hr at 04/11/13 1610     Current Facility-Administered Medications   Medication Dose Route Frequency   . [COMPLETED] 0.9% sodium chloride with heparin       . [COMPLETED] 0.9% sodium chloride with heparin       . amLODIPine  5 mg Oral Daily   . aspirin EC  324 mg Oral Daily   . atorvastatin  20 mg Oral QHS   . bacitracin zinc       . [START ON 04/12/2013] ceFAZolin  1 g Intravenous Q8H SCH   . [COMPLETED] ceFAZolin       . DULoxetine  30 mg Oral BID   . enoxaparin  40 mg Subcutaneous QAM   . [COMPLETED] fentaNYL       . [EXPIRED] furosemide       . [COMPLETED] lidocaine       . [COMPLETED] magnesium sulfate  1 g Intravenous Q1H   . [COMPLETED] midazolam       . midazolam       . pantoprazole  40 mg Oral QAM AC   . [DISCONTINUED] bacitracin  25,000 Units Irrigation Once   . [DISCONTINUED] ceFAZolin  2 g Intravenous 30 Min Pre-Op   .  [DISCONTINUED] iodixanol  20 mL Intravenous Once   . [DISCONTINUED] iodixanol  80 mL Intra-arterial Once   . [DISCONTINUED] bacitracin-polymixin-gentamicin   Irrigation Once       Labs:       Lab 04/11/13 0510   WBC 6.3   HGB 12.4*   HCT 35.5*   PLT 261         Lab 04/11/13 0510 04/09/13 1233 04/09/13 0406 04/07/13 0420   GLU -- 125* -- --   BUN -- 15 17 18    CREAT -- 1.11 1.01 1.08   NA -- 139 -- --   K -- 5.9* -- --   CL -- 111* -- --   CO2 -- 18.2* -- --   MG 1.4* -- -- --   AST -- 56* -- --   ALT -- 42 -- --   TSH -- 2.40 -- --  No results found for this basename: CHOL,TRIG,HDL,LDL in the last 168 hours      Lab 04/08/13 0604 04/08/13 0022 04/07/13 1852 04/07/13 1056 04/07/13 0420 04/06/13 1515   TROPONIN -- -- -- -- -- --   ISTATTROPONI -- -- -- -- -- --   CK -- -- -- 208 242* 156   CKMB 6.3* 7.8* 10.7* -- -- --         [id number] on chart    Telemetry: normal sinus         Impression and Plan:   Cardiac asystole with over aggressive fluid resussitation. Kidney damage due to arrest, not dehydration. No evidence of ETOH withdrawal to date. Probable fractured rib due to CPR.  Home tomorrow PM.

## 2013-04-11 NOTE — Code Documentation (Addendum)
Unable to perform intubation, returned to BVM ventilation.  Call for portable glidescope

## 2013-04-11 NOTE — Progress Notes (Signed)
Bed alarm activated found standing at side of bed bent over attempting to untangle from monitor cords. Assisted back to bed and re-educated to remain in bed and call staff for any assistance. Verbalized understanding and call bell in reach.

## 2013-04-11 NOTE — Op Note (Signed)
Dual chamber pacer was placed from the left subclavian vein. Indication: Asystole with syncope. Venogram required. Chest xray pending. Dictation # [id number]

## 2013-04-11 NOTE — Progress Notes (Signed)
Third attempt to get ou of bed CIWA score elevated and noted increased agitation moved to more visible room for patient safety.

## 2013-04-12 ENCOUNTER — Inpatient Hospital Stay: Payer: BC Managed Care – PPO

## 2013-04-12 LAB — CBC AND DIFFERENTIAL
Basophils %: 0.3 % (ref 0.0–3.0)
Basophils Absolute: 0 10*3/uL (ref 0.0–0.3)
Eosinophils %: 3.7 % (ref 0.0–7.0)
Eosinophils Absolute: 0.2 10*3/uL (ref 0.0–0.8)
Hematocrit: 37.2 % — ABNORMAL LOW (ref 39.0–52.5)
Hemoglobin: 12.6 gm/dL — ABNORMAL LOW (ref 13.0–17.5)
Lymphocytes Absolute: 0.8 10*3/uL (ref 0.6–5.1)
Lymphocytes: 11.8 % — ABNORMAL LOW (ref 15.0–46.0)
MCH: 30 pg (ref 28–35)
MCHC: 34 gm/dL (ref 32–36)
MCV: 88 fL (ref 80–100)
MPV: 7.6 fL (ref 6.0–10.0)
Monocytes Absolute: 0.7 10*3/uL (ref 0.1–1.7)
Monocytes: 10.2 % (ref 3.0–15.0)
Neutrophils %: 74 % (ref 42.0–78.0)
Neutrophils Absolute: 4.8 10*3/uL (ref 1.7–8.6)
PLT CT: 273 10*3/uL (ref 130–440)
RBC: 4.25 10*6/uL (ref 4.00–5.70)
RDW: 13.5 % (ref 11.0–14.0)
WBC: 6.5 10*3/uL (ref 4.0–11.0)

## 2013-04-12 LAB — MAGNESIUM: Magnesium: 1.7 mg/dL (ref 1.6–2.6)

## 2013-04-12 LAB — BASIC METABOLIC PANEL
Anion Gap: 12.2 mMol/L (ref 7.0–18.0)
BUN / Creatinine Ratio: 15.6 Ratio (ref 10.0–30.0)
BUN: 14 mg/dL (ref 7–22)
CO2: 27.7 mMol/L (ref 20.0–30.0)
Calcium: 9.1 mg/dL (ref 8.5–10.5)
Chloride: 102 mMol/L (ref 98–110)
Creatinine: 0.9 mg/dL (ref 0.80–1.30)
EGFR: >60 mL/min/{1.73_m2}
Glucose: 101 mg/dL — ABNORMAL HIGH (ref 70–99)
Osmolality Calc: 276 mOsm/kg (ref 275–300)
Potassium: 3.9 mMol/L (ref 3.5–5.3)
Sodium: 138 mMol/L (ref 136–147)

## 2013-04-12 LAB — ECG 12-LEAD
P Wave Axis: 48 deg
P Wave Duration: 96 ms
P-R Interval: 148 ms
Patient Age: 67 years
Q-T Dispersion: 18 ms
Q-T Interval(Corrected): 439 ms
Q-T Interval: 376 ms
QRS Axis: 39 deg
QRS Duration: 90 ms
T Axis: 39 deg
Ventricular Rate: 82 /min

## 2013-04-12 LAB — PHOSPHORUS: Phosphorus: 4.1 mg/dL (ref 2.3–4.7)

## 2013-04-12 LAB — CALCIUM, IONIZED: Calcium, Ionized: 4.91 mg/dL (ref 4.35–5.10)

## 2013-04-12 LAB — VH IONIZED MAGNESIUM: Ion Magnesium: 0.51 mMol/L — ABNORMAL LOW (ref 0.53–0.67)

## 2013-04-12 MED ORDER — ZOLPIDEM TARTRATE 10 MG PO TABS
10.0000 mg | ORAL_TABLET | Freq: Every evening | ORAL | Status: DC | PRN
Start: 2013-04-12 — End: 2014-09-10

## 2013-04-12 MED ORDER — HYDROCODONE-ACETAMINOPHEN 10-325 MG PO TABS
2.00 | ORAL_TABLET | ORAL | Status: DC | PRN
Start: 2013-04-12 — End: 2014-09-10

## 2013-04-12 MED FILL — Epinephrine Inj 1 MG/ML (1:1000): INTRAMUSCULAR | Qty: 1 | Status: AC
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
## 2013-04-12 NOTE — Discharge Instructions (Addendum)
Diet: Heart Healthy and No added Salt      Activities:  Restrictions: See post pacemaker discharge instructions and No lifting greater than 15 pounds      Notify Physician:  Shortness of breath, Fever, Unrelieved pain, Drainage, redness warmth of surgical site, Bleeding, Numbness, tingling, coolness of limb and See pacemaker discharge instructions      Wound Care:  None, Keep dressing dry and clean and Special Instructions: See pacer discharge instructions  . Do not lift Left arm over level of shoulder for thirty days. do not shower for first seven days post implant. Change mepilex dressing daily after bathing and keep wound covered and dressing dry for seven days.  Call office Monday for an appointment with Sean [name] in two weeks for wound check. Call office or Doctor on call immediately for any fever or drainage from site or for excessive pain.

## 2013-04-12 NOTE — Progress Notes (Signed)
Report called to floor RN tele applied and patient transported to floor.

## 2013-04-12 NOTE — Progress Notes (Signed)
Pt just arrived from CCU, tele intact, no c/o other than some discomfort from pacer insertion, gave him an ice pack to site. Will check with pt later to see if he need any pain medication. Gevena Cotton RN

## 2013-04-12 NOTE — Progress Notes (Signed)
Cardiology Progress Note      Date Time: 04/12/2013 6:09 PM  Patient Name: Sean [name], Sean [name] DOB:  11/29/1945  Attending:  Rudean Curt, MD    Subjective:   Painful when laughs or coughs. Sore over pacer site.      Physical Exam:       Temp:  [97.9 F (36.6 C)-99.1 F (37.3 C)] 99.1 F (37.3 C)  Heart Rate:  [80-98] 95   Resp Rate:  [13-23] 18   BP: (126-162)/(71-108) 145/71 mmHg  Temp (24hrs), Avg:98.5 F (36.9 C), Min:97.9 F (36.6 C), Max:99.1 F (37.3 C)    112.3 kg (247 lb 9.2 oz)   Intake and Output Summary (Last 24 hours) at Date Time    Intake/Output Summary (Last 24 hours) at 04/12/13 1809  Last data filed at 04/12/13 1353   Gross per 24 hour   Intake    600 ml   Output    950 ml   Net   -350 ml       Cardiovascular: Regular rate and rhythm; Normal S1 and S2 without murmurs, rubs, or gallops.  Pulmonary:Resonant to percussion and clear to auscultation; Equal expansion with out labored respiration   Extremities:No cyanosis, clubbing, or edema, Pacer site fine.  Skin: Warm and dry    Medications:        . lorazepam     . [DISCONTINUED] sodium chloride 75 mL/hr at 04/11/13 1610     Current Facility-Administered Medications   Medication Dose Route Frequency   . amLODIPine  5 mg Oral Daily   . aspirin EC  324 mg Oral Daily   . atorvastatin  20 mg Oral QHS   . [EXPIRED] bacitracin zinc       . [COMPLETED] ceFAZolin  1 g Intravenous Q8H SCH   . DULoxetine  30 mg Oral BID   . enoxaparin  40 mg Subcutaneous QAM   . [EXPIRED] midazolam       . pantoprazole  40 mg Oral QAM AC   . [DISCONTINUED] ceFAZolin  2 g Intravenous 30 Min Pre-Op   . [DISCONTINUED] iodixanol  20 mL Intravenous Once   . [DISCONTINUED] iodixanol  80 mL Intra-arterial Once   . [DISCONTINUED] magnesium sulfate  2 g Intravenous Once   . [DISCONTINUED] bacitracin-polymixin-gentamicin   Irrigation Once       Labs:       Lab 04/12/13 0631   WBC 6.5   HGB 12.6*   HCT 37.2*   PLT 273         Lab 04/12/13 0631 04/09/13 1233 04/09/13 0406   GLU 101*  -- --   BUN 14 15 17    CREAT 0.90 1.11 1.01   NA 138 -- --   K 3.9 -- --   CL 102 -- --   CO2 27.7 -- --   MG 1.7 -- --   AST -- 56* --   ALT -- 42 --   TSH -- 2.40 --       No results found for this basename: CHOL,TRIG,HDL,LDL in the last 168 hours      Lab 04/08/13 0604 04/08/13 0022 04/07/13 1852 04/07/13 1056 04/07/13 0420 04/06/13 1515   TROPONIN -- -- -- -- -- --   ISTATTROPONI -- -- -- -- -- --   CK -- -- -- 208 242* 156   CKMB 6.3* 7.8* 10.7* -- -- --         RUE:AVWU on chart    Telemetry: normal sinus  rhythm        Impression and Plan:   Sinus arrest with syncope, s/p pacer  Three broken ribs  Discharge today

## 2013-04-12 NOTE — Progress Notes (Signed)
Pt had a somewhat good night came over from ccu in the middle of night, did c/o incision pain, tried an ice pack but pain was too severe for that need pain medication, which was effective. Tele intact. Gevena Cotton RN

## 2013-10-23 DIAGNOSIS — I10 Essential (primary) hypertension: Secondary | ICD-10-CM | POA: Diagnosis not present

## 2013-10-23 DIAGNOSIS — F39 Unspecified mood [affective] disorder: Secondary | ICD-10-CM | POA: Diagnosis not present

## 2013-10-23 DIAGNOSIS — E785 Hyperlipidemia, unspecified: Secondary | ICD-10-CM | POA: Diagnosis not present

## 2013-10-23 DIAGNOSIS — R7301 Impaired fasting glucose: Secondary | ICD-10-CM | POA: Diagnosis not present

## 2013-11-20 DIAGNOSIS — I10 Essential (primary) hypertension: Secondary | ICD-10-CM | POA: Diagnosis not present

## 2013-11-20 DIAGNOSIS — I498 Other specified cardiac arrhythmias: Secondary | ICD-10-CM | POA: Diagnosis not present

## 2013-11-20 DIAGNOSIS — E785 Hyperlipidemia, unspecified: Secondary | ICD-10-CM | POA: Diagnosis not present

## 2013-11-27 DIAGNOSIS — R0989 Other specified symptoms and signs involving the circulatory and respiratory systems: Secondary | ICD-10-CM | POA: Diagnosis not present

## 2014-04-13 HISTORY — PX: PACEMAKER INSERTION: SHX728

## 2014-05-03 ENCOUNTER — Emergency Department
Admission: EM | Admit: 2014-05-03 | Discharge: 2014-05-03 | Discharge status: Against Medical Advice | Payer: Medicare Other | Attending: Internal Medicine | Admitting: Internal Medicine

## 2014-05-03 ENCOUNTER — Inpatient Hospital Stay: Payer: Commercial Managed Care - PPO

## 2014-05-03 DIAGNOSIS — F10939 Alcohol use, unspecified with withdrawal, unspecified: Secondary | ICD-10-CM

## 2014-05-03 DIAGNOSIS — I10 Essential (primary) hypertension: Secondary | ICD-10-CM

## 2014-05-03 DIAGNOSIS — F1010 Alcohol abuse, uncomplicated: Secondary | ICD-10-CM | POA: Diagnosis present

## 2014-05-03 DIAGNOSIS — F10930 Alcohol use, unspecified with withdrawal, uncomplicated: Secondary | ICD-10-CM

## 2014-05-03 DIAGNOSIS — R000 Tachycardia, unspecified: Secondary | ICD-10-CM | POA: Diagnosis present

## 2014-05-03 DIAGNOSIS — F10929 Alcohol use, unspecified with intoxication, unspecified: Secondary | ICD-10-CM | POA: Diagnosis present

## 2014-05-03 LAB — ECG 12-LEAD
P Wave Axis: 27 deg
P Wave Duration: 102 ms
P-R Interval: 136 ms
Patient Age: 68 years
Q-T Dispersion: 58 ms
Q-T Interval(Corrected): 445 ms
Q-T Interval: 362 ms
QRS Axis: 57 deg
QRS Duration: 90 ms
T Axis: 39 deg
Ventricular Rate: 91 /min

## 2014-05-03 LAB — I-STAT CHEM 8 CARTRIDGE
Anion Gap I-Stat: 16 (ref 7–16)
BUN I-Stat: 25 mg/dL — ABNORMAL HIGH (ref 6–20)
Calcium Ionized I-Stat: 4.3 mg/dL — ABNORMAL LOW (ref 4.35–5.10)
Chloride I-Stat: 99 mMol/L (ref 98–112)
Creatinine I-Stat: 1.4 mg/dL — ABNORMAL HIGH (ref 0.90–1.30)
EGFR: 50 mL/min/{1.73_m2}
Glucose I-Stat: 112 mg/dL — ABNORMAL HIGH (ref 70–99)
Hematocrit I-Stat: 40 % (ref 39.0–52.5)
Hemoglobin I-Stat: 13.6 gm/dL (ref 13.0–17.5)
Potassium I-Stat: 3.9 mMol/L (ref 3.5–5.3)
Sodium I-Stat: 138 mMol/L (ref 135–145)
TCO2 I-Stat: 28 mMol/L (ref 24–29)

## 2014-05-03 LAB — HEPATIC FUNCTION PANEL
ALT: 37 U/L (ref 0–55)
AST (SGOT): 42 U/L (ref 10–42)
Albumin/Globulin Ratio: 1.06 Ratio (ref 0.70–1.50)
Albumin: 3.6 gm/dL (ref 3.5–5.0)
Alkaline Phosphatase: 96 U/L (ref 40–145)
Bilirubin Direct: 0.4 mg/dL — ABNORMAL HIGH (ref 0.0–0.3)
Bilirubin, Total: 0.9 mg/dL (ref 0.1–1.2)
Globulin: 3.4 gm/dL (ref 2.0–4.0)
Protein, Total: 7 gm/dL (ref 6.0–8.3)

## 2014-05-03 LAB — CBC AND DIFFERENTIAL
Basophils %: 0.3 % (ref 0.0–3.0)
Basophils Absolute: 0 10*3/uL (ref 0.0–0.3)
Eosinophils %: 0.4 % (ref 0.0–7.0)
Eosinophils Absolute: 0 10*3/uL (ref 0.0–0.8)
Hematocrit: 40 % (ref 39.0–52.5)
Hemoglobin: 13.9 gm/dL (ref 13.0–17.5)
Lymphocytes Absolute: 1 10*3/uL (ref 0.6–5.1)
Lymphocytes: 18 % (ref 15.0–46.0)
MCH: 30 pg (ref 28–35)
MCHC: 35 gm/dL (ref 32–36)
MCV: 88 fL (ref 80–100)
MPV: 8.4 fL (ref 6.0–10.0)
Monocytes Absolute: 0.3 10*3/uL (ref 0.1–1.7)
Monocytes: 5.7 % (ref 3.0–15.0)
Neutrophils %: 75.6 % (ref 42.0–78.0)
Neutrophils Absolute: 4.3 10*3/uL (ref 1.7–8.6)
PLT CT: 115 10*3/uL — ABNORMAL LOW (ref 130–440)
RBC: 4.57 10*6/uL (ref 4.00–5.70)
RDW: 13.3 % (ref 11.0–14.0)
WBC: 5.7 10*3/uL (ref 4.0–11.0)

## 2014-05-03 LAB — VH I-STAT CHEM 8 NOTIFICATION

## 2014-05-03 LAB — LIPASE: Lipase: 15 U/L (ref 8–78)

## 2014-05-03 LAB — ETHANOL: Alcohol: 260 mg/dL — ABNORMAL HIGH (ref 0–9)

## 2014-05-03 MED ORDER — SODIUM CHLORIDE 0.9 % IV SOLN
INTRAVENOUS | Status: DC
Start: 2014-05-03 — End: 2014-05-04

## 2014-05-03 MED ORDER — LORAZEPAM 2 MG/ML IJ SOLN
1.0000 mg | Freq: Once | INTRAMUSCULAR | Status: AC
Start: 2014-05-03 — End: 2014-05-03
  Administered 2014-05-03: 1 mg via INTRAVENOUS

## 2014-05-03 MED ORDER — LORAZEPAM 2 MG/ML IJ SOLN
INTRAMUSCULAR | Status: AC
Start: 2014-05-03 — End: ?
  Filled 2014-05-03: qty 1

## 2014-05-03 NOTE — ED Notes (Signed)
While turning on side pt accidentally pulled own IV access out. Dressing applied to site and wrapped with cobain. Bleeding controlled

## 2014-05-03 NOTE — ED Notes (Signed)
Pt voicing desire to leave AMA. RN and ed physician at pt bedside. Educated pt on risks for leaving hospital against medical advice. Pt verbalized understanding. Required paperwork signed and completed

## 2014-05-03 NOTE — ED Notes (Signed)
ED physician at pt bedside. 

## 2014-05-03 NOTE — H&P (Signed)
History of Present Illness  05/03/2014   Admit date / time - 05/03/2014  8:05 PM  Chief complaint : Alcohol abuse    HPI:    Sean [name] is a 68 y.o.  male with history of hypertension status post pacemaker placement hyperlipidemia patient is lately having some problem with his wife he is been living at heart since about 5 weeks he is a retired Marine scientist from our facility, since about 2 weeks she's been drinking wine every day his uncle came from [place] and brought him to the emergency room them emergency room his alcohol level was elevated he didn't have a problem with alcohol before as also some depression which is getting worse lately O by the physician to see the patient for evaluation and treatment of admission he was tachycardic also has a history of heart problem and aortic stenosis mild due to alcohol intoxication and also possible DTs and placed the patient an stepdown        Past Medical History   Past Medical History   Diagnosis Date   . Hypertension    . GERD (gastroesophageal reflux disease)    . Gout    . Hyperlipemia    . Hx of radical prostatectomy    . Hydrocele        pacemaker     Mild aortic stenosis      Past Surgical History  Past Surgical History   Procedure Laterality Date   . Appendectomy  1951   . Hydrocele excision / repair  1980s   . Prostatectomy, radical  2005       Allergies  No Known Allergies    Prior to Admission Medications  Current Facility-Administered Medications   Medication Dose Route Frequency Provider Last Rate Last Dose   . 0.9%  NaCl infusion   Intravenous Continuous Valene Bors A, DO 120 mL/hr at 05/03/14 2110       Current Outpatient Prescriptions   Medication Sig Dispense Refill   . amLODIPine (NORVASC) 5 MG tablet      . atorvastatin (LIPITOR) 40 MG tablet      . DULoxetine (CYMBALTA) 60 MG capsule      . losartan (COZAAR) 100 MG tablet      . omeprazole (PRILOSEC) 20 MG capsule      . COLCRYS 0.6 MG tablet      . diazepam (VALIUM) 5 MG tablet      .  hydrochlorothiazide (HYDRODIURIL) 25 MG tablet      . HYDROcodone-acetaminophen (NORCO 10-325) 10-325 MG per tablet Take 2 tablets by mouth every 4 (four) hours as needed for Pain. 60 tablet 0   . zolpidem (AMBIEN) 10 MG tablet Take 1 tablet (10 mg total) by mouth nightly as needed for Sleep. 30 tablet 0         Social History  History   Substance Use Topics   . Smoking status: Never Smoker    . Smokeless tobacco: Not on file   . Alcohol Use: 3.0 oz/week     5 Glasses of wine per week      Comment: last wine at 4 p today       Family History  History reviewed. No pertinent family history.     Review of Systems  Ten completed review of systems including eyes, ENT, cardiovascular, respiratory, gastrointestinal, genitourinary, psychiatric, neurologic, integumentary, allergic/hematology,  are reviewed and found unremarkable except pertinent positives mentioned in the history of present illness and past medical history.  Physical Exam  BP 153/89 mmHg  Pulse 95  Resp 12  Ht 1.778 m (5\' 10" )  Wt 92.987 kg (205 lb)  BMI 29.41 kg/m2  SpO2 96%  No intake or output data in the 24 hours ending 05/03/14 2229  Physical Exam    1) General appearance:  well developed,well nourished, in no apparent acute cardiorespiratory distress.     2) HEENT: Head is atraumatic and normocephalic. Pupils are equally reactive to light and accommodation. Extraocular muscles are intact. Patient has intact external auditory canal. No abnormal lesions or bleeding from nose. Oral mucosa moist with no pharyngeal congestion.     3) Neck: Supple. Trachea is central, no JVD or carotid bruit.     4) Chest: Clear to auscultation bilaterally, no wheezing     6) CVS: The S1, S2 normal. Regular rate and rhythm.systolic murmur 3 out of 6     7) Abdomen:  soft, non tender, non distended, no palpable mass. Bowel sounds audible bowel.     8) Musculoskeletal: Patient has 5/5 motor strength in bilateral upper extremities as well as bilateral LE. No pitting  edema in bilateral lower extremities.    9) Neurological: Cranial nerves II-XII intact. Grossly non focal    10) Psychiatric: Alert and oriented x 3. Mood is appropriate.     11) Integumentary: warm with normal skin turgor, no rash    12) Lymphatics: No lymphadenopathy in axillary, cervial and inguinal area.       Labs    Labs Reviewed   CBC AND DIFFERENTIAL - Abnormal; Notable for the following:     PLT CT 115 (*)     All other components within normal limits   HEPATIC FUNCTION PANEL - Abnormal; Notable for the following:     Bilirubin, Direct 0.4 (*)     All other components within normal limits   ETHANOL - Abnormal; Notable for the following:     Alcohol 260 (*)     All other components within normal limits   I-STAT CHEM 8 CARTRIDGE - Abnormal; Notable for the following:     Ionized Ca, ISTAT 4.30 (*)     i-STAT Glucose 112 (*)     i-STAT Creatinine 1.40 (*)     i-STAT BUN 25 (*)     All other components within normal limits   VH I-STAT CHEM 8 NOTIFICATION   LIPASE   VH URINE DRUG SCREEN   MAGNESIUM     No orders to display       Assessment and Plan    Active Hospital Problems    Diagnosis   . Alcohol abuse   . Alcohol intoxication   . Tachycardia    plan:  We will admit the patient to stepdown for close monitoring, placed the patient on the CIWA protocol, also will continue with IV fluids with potassium good blood work tomorrow morning and will monitor the patient closely for DTs, we'll continue his home medication does have some depression and some argument with his wife        CODE Status : Full code    GI prophylaxis: PPI  DVT prophylaxis: Lovenox      Edmon Crape  05/03/2014    10:29 PM

## 2014-05-03 NOTE — ED Provider Notes (Addendum)
Physician/Midlevel provider first contact with patient: 05/03/14 2016         History     Chief Complaint   Patient presents with   . wanting detox     HPI Comments: Pt is a 68 y/o male who arrived asking for help with detox. His last drink was at 3:00PM today. He states "I just feel terrible" and "I need help." Denies seizure or shaking, abdominal pain, blood in stools, fever or chills. Prior history of mild AS.     The history is provided by the patient. No language interpreter was used.         Past Medical History   Diagnosis Date   . Hypertension    . GERD (gastroesophageal reflux disease)    . Gout    . Hyperlipemia    . Hx of radical prostatectomy    . Hydrocele        Past Surgical History   Procedure Laterality Date   . Appendectomy  1951   . Hydrocele excision / repair  1980s   . Prostatectomy, radical  2005       History reviewed. No pertinent family history.    Social  History   Substance Use Topics   . Smoking status: Never Smoker    . Smokeless tobacco: Not on file   . Alcohol Use: 3.0 oz/week     5 Glasses of wine per week      Comment: last wine at 4 p today       .     No Known Allergies    Discharge Medication List as of 05/03/2014 11:00 PM      CONTINUE these medications which have NOT CHANGED    Details   amLODIPine (NORVASC) 5 MG tablet Starting 03/02/2013, Until Discontinued, Historical Med      atorvastatin (LIPITOR) 40 MG tablet Starting 03/21/2013, Until Discontinued, Historical Med      DULoxetine (CYMBALTA) 60 MG capsule Starting 03/04/2013, Until Discontinued, Historical Med      losartan (COZAAR) 100 MG tablet Starting 03/21/2013, Until Discontinued, Historical Med      omeprazole (PRILOSEC) 20 MG capsule Starting 01/10/2013, Until Discontinued, Historical Med      COLCRYS 0.6 MG tablet Starting 02/06/2013, Until Discontinued, Historical Med      diazepam (VALIUM) 5 MG tablet Starting 03/26/2013, Until Discontinued, Historical Med      hydrochlorothiazide (HYDRODIURIL) 25 MG tablet Starting  01/27/2013, Until Discontinued, Historical Med      HYDROcodone-acetaminophen (NORCO 10-325) 10-325 MG per tablet Take 2 tablets by mouth every 4 (four) hours as needed for Pain., Starting 04/12/2013, Until Discontinued, Print      zolpidem (AMBIEN) 10 MG tablet Take 1 tablet (10 mg total) by mouth nightly as needed for Sleep., Starting 04/12/2013, Until Discontinued, Print              Review of Systems   Gastrointestinal: Negative for nausea and vomiting.   Musculoskeletal: Positive for back pain.   Neurological: Negative for seizures.   Psychiatric/Behavioral: Positive for dysphoric mood. The patient is nervous/anxious.        Physical Exam    BP: 138/109 mmHg, Heart Rate: 118, Resp Rate: 20, SpO2: 97 %, Weight: 92.987 kg    Physical Exam   Constitutional: He is oriented to person, place, and time. He appears well-developed and well-nourished. He appears distressed.   68 yr old diaphoretic white male with anxiety and dysphoria and requesting alcohol detox.   HENT:  Head: Normocephalic and atraumatic.   Right Ear: External ear normal.   Left Ear: External ear normal.   Nose: Nose normal.   Mouth/Throat: Oropharynx is clear and moist. No oropharyngeal exudate.   Eyes: Conjunctivae and EOM are normal. Pupils are equal, round, and reactive to light.   Bilateral nystagmus end point.   Neck: Normal range of motion.   Cardiovascular: Normal rate, regular rhythm, normal heart sounds and intact distal pulses.    No murmur heard.  Pulmonary/Chest: Effort normal and breath sounds normal. No respiratory distress. He has no wheezes. He has no rales.   Pacemaker noted left upper chest.   Abdominal: Soft. Bowel sounds are normal. He exhibits distension. He exhibits no mass. There is tenderness. There is no rebound and no guarding.   Mild diffuse tenderness.   Musculoskeletal: Normal range of motion. He exhibits no edema or tenderness.   Neurological: He is alert and oriented to person, place, and time. He exhibits normal muscle  tone.   Skin: Skin is warm. He is diaphoretic.   Psychiatric:   Tearful remorseful appears in alcohol withdrawal.   Nursing note and vitals reviewed.      MDM and ED Course   10:47 PM  Pt's IV is out states he "rolled over and it came out" blood noted on floor in room #8. Bleeding controlled in right arm by me with elevation, direct pressure and Coban dressing. He is now stating he is going to leave AMA and contact his wife to get further help.  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  I have called his home phone number to contact his wife at his request and only got the answering machine.  I have informed him he will need a responsible adult to come and see him to home. And he will sign out AMA.    ED Medication Orders    Start     Status Ordering Provider    05/03/14 2034     Continuous,   Status:  Discontinued     Route: Intravenous     Discontinued Jaiyla Granados A    05/03/14 2034  LORazepam (ATIVAN) injection 1 mg   Once in ED     Route: Intravenous  Ordered Dose: 1 mg     Last MAR action:  Given Lynnae Ludemann A           MDM   :Diagnostic considerations include, but are not limited to: alcohol withdrawal, alcoholism, electrolyte abnormality.    Results for orders placed or performed during the hospital encounter of 05/03/14   CBC   Result Value Ref Range    WBC 5.7 4.0 - 11.0 K/cmm    RBC 4.57 4.00 - 5.70 M/cmm    Hemoglobin 13.9 13.0 - 17.5 gm/dL    Hematocrit 78.2 95.6 - 52.5 %    MCV 88 80 - 100 fL    MCH 30 28 - 35 pg    MCHC 35 32 - 36 gm/dL    RDW 21.3 08.6 - 57.8 %    PLT CT 115 (L) 130 - 440 K/cmm    MPV 8.4 6.0 - 10.0 fL    NEUTROPHIL % 75.6 42.0 - 78.0 %    Lymphocytes 18.0 15.0 - 46.0 %    Monocytes 5.7 3.0 - 15.0 %    Eosinophils % 0.4 0.0 - 7.0 %  Basophils % 0.3 0.0 - 3.0 %    Neutrophils Absolute 4.3 1.7 - 8.6 K/cmm    Lymphocytes Absolute 1.0 0.6 - 5.1 K/cmm    Monocytes Absolute 0.3 0.1 - 1.7  K/cmm    Eosinophils Absolute 0.0 0.0 - 0.8 K/cmm    BASO Absolute 0.0 0.0 - 0.3 K/cmm   I-Stat Chem 8   Result Value Ref Range    I-STAT Notification Istat Notification    Lipase   Result Value Ref Range    Lipase 15 8 - 78 U/L   LFTs   Result Value Ref Range    Protein, Total 7.0 6.0 - 8.3 gm/dL    Albumin 3.6 3.5 - 5.0 gm/dL    Alkaline Phosphatase 96 40 - 145 U/L    ALT 37 0 - 55 U/L    AST (SGOT) 42 10 - 42 U/L    Bilirubin, Total 0.9 0.1 - 1.2 mg/dL    Bilirubin, Direct 0.4 (H) 0.0 - 0.3 mg/dL    Albumin/Globulin Ratio 1.06 0.70 - 1.50 Ratio    Globulin 3.4 2.0 - 4.0 gm/dL   Alcohol Level   Result Value Ref Range    Alcohol 260 (H) 0 - 9 mg/dL   i-Stat Chem 8 CartrIDge   Result Value Ref Range    i-STAT Sodium 138 135 - 145 mMol/L    i-STAT Potassium 3.9 3.5 - 5.3 mMol/L    i-STAT Chloride 99 98 - 112 mMol/L    TCO2, ISTAT 28 24 - 29 mMol/L    Ionized Ca, ISTAT 4.30 (L) 4.35 - 5.10 mg/dL    i-STAT Glucose 540 (H) 70 - 99 mg/dL    i-STAT Creatinine 9.81 (H) 0.90 - 1.30 mg/dL    i-STAT BUN 25 (H) 6 - 20 mg/dL    Anion Gap, ISTAT 19.1 7 - 16    EGFR 50 mL/min/1.35m2    i-STAT Hematocrit 40.0 39.0 - 52.5 %    i-STAT Hemoglobin 13.6 13.0 - 17.5 gm/dL        Procedures    Clinical Impression & Disposition     Clinical Impression  Final diagnoses:   Alcohol withdrawal, uncomplicated    Alcohol detoxication request.    ED Disposition    AMA Admitting Physician: Edmon Crape [[id number]]  Diagnosis: Alcohol abuse [[id number]]  Estimated Length of Stay: > or = to 2 midnights  Tentative Discharge Plan?: Home or Self Care [1]  Patient Class: Inpatient [101]  I certify that inpatient services are medically necessary for this patient. Please see H&P and MD progress notes for additional information about the patient's course of treatment. For Medicare patients, services provided in accordance with 412.3 and expected LOS to be greater than 2 midnights for Medicare patients.: Yes             Discharge Medication List as of 05/03/2014  11:00 PM                I have reviewed all medical history, surgical history, family history, current medications, and known allergies with the patient. In addition, I have discussed the results of all laboratory and imaging results relevant to the diagnosis with the patient.    All relevant and clinical information was transcribed by me, Pleas Patricia, acting as a scribe for Dr. Eddie Candle.        Valene Bors A, DO  05/03/14 2034    Valene Bors A, DO  05/03/14 2038    Cam Hai,  DO  05/03/14 2041    Valene Bors A, DO  05/03/14 2238    Valene Bors A, DO  05/03/14 2238    Valene Bors A, DO  05/03/14 2250    Valene Bors A, DO  05/09/14 2121    Valene Bors A, DO  05/09/14 2121

## 2014-05-03 NOTE — ED Notes (Signed)
Pt presents via triage; states drinks an average on 4-5 glasses of etoh/wine daily and requesting that he be admitted for detox. States he just doesn't feel well, but doesn't pinpoint specifics when asked. Denies dizziness, black tarry stools, or nausea or vomiting. Last etoh was 4 pm today, a glass of wine

## 2014-05-03 NOTE — ED Notes (Signed)
Pt aware the need for urine specimen.  

## 2014-09-10 ENCOUNTER — Emergency Department (EMERGENCY_DEPARTMENT_HOSPITAL): Payer: Medicare Other | Admitting: Professional

## 2014-09-10 ENCOUNTER — Encounter (HOSPITAL_BASED_OUTPATIENT_CLINIC_OR_DEPARTMENT_OTHER): Payer: Self-pay

## 2014-09-10 ENCOUNTER — Encounter: Payer: Self-pay | Admitting: Addiction Medicine

## 2014-09-10 ENCOUNTER — Emergency Department: Payer: Medicare Other

## 2014-09-10 ENCOUNTER — Inpatient Hospital Stay: Payer: Self-pay | Admitting: Addiction Medicine

## 2014-09-10 ENCOUNTER — Inpatient Hospital Stay
Admission: AD | Admit: 2014-09-10 | Discharge: 2014-09-13 | DRG: 895 | Disposition: A | Discharge status: Home / Self Care | Payer: Medicare Other | Attending: Addiction Medicine | Admitting: Addiction Medicine

## 2014-09-10 ENCOUNTER — Emergency Department
Admission: EM | Admit: 2014-09-10 | Discharge: 2014-09-10 | Disposition: A | Discharge status: Other Acute Inpatient Hospital | Payer: Medicare Other | Attending: Emergency Medicine | Admitting: Emergency Medicine

## 2014-09-10 DIAGNOSIS — R079 Chest pain, unspecified: Secondary | ICD-10-CM | POA: Diagnosis not present

## 2014-09-10 DIAGNOSIS — R6889 Other general symptoms and signs: Secondary | ICD-10-CM | POA: Diagnosis present

## 2014-09-10 DIAGNOSIS — Z8546 Personal history of malignant neoplasm of prostate: Secondary | ICD-10-CM

## 2014-09-10 DIAGNOSIS — K709 Alcoholic liver disease, unspecified: Secondary | ICD-10-CM | POA: Diagnosis present

## 2014-09-10 DIAGNOSIS — M797 Fibromyalgia: Secondary | ICD-10-CM | POA: Diagnosis present

## 2014-09-10 DIAGNOSIS — E785 Hyperlipidemia, unspecified: Secondary | ICD-10-CM | POA: Insufficient documentation

## 2014-09-10 DIAGNOSIS — K219 Gastro-esophageal reflux disease without esophagitis: Secondary | ICD-10-CM | POA: Diagnosis present

## 2014-09-10 DIAGNOSIS — F1010 Alcohol abuse, uncomplicated: Secondary | ICD-10-CM | POA: Insufficient documentation

## 2014-09-10 DIAGNOSIS — I350 Nonrheumatic aortic (valve) stenosis: Secondary | ICD-10-CM | POA: Diagnosis present

## 2014-09-10 DIAGNOSIS — I10 Essential (primary) hypertension: Secondary | ICD-10-CM | POA: Diagnosis present

## 2014-09-10 DIAGNOSIS — D696 Thrombocytopenia, unspecified: Secondary | ICD-10-CM | POA: Diagnosis not present

## 2014-09-10 DIAGNOSIS — M109 Gout, unspecified: Secondary | ICD-10-CM | POA: Diagnosis present

## 2014-09-10 DIAGNOSIS — Z950 Presence of cardiac pacemaker: Secondary | ICD-10-CM

## 2014-09-10 DIAGNOSIS — F419 Anxiety disorder, unspecified: Secondary | ICD-10-CM

## 2014-09-10 DIAGNOSIS — F1020 Alcohol dependence, uncomplicated: Secondary | ICD-10-CM

## 2014-09-10 DIAGNOSIS — Z811 Family history of alcohol abuse and dependence: Secondary | ICD-10-CM

## 2014-09-10 DIAGNOSIS — J310 Chronic rhinitis: Secondary | ICD-10-CM | POA: Diagnosis present

## 2014-09-10 DIAGNOSIS — Z818 Family history of other mental and behavioral disorders: Secondary | ICD-10-CM

## 2014-09-10 DIAGNOSIS — R110 Nausea: Secondary | ICD-10-CM | POA: Diagnosis present

## 2014-09-10 DIAGNOSIS — F329 Major depressive disorder, single episode, unspecified: Secondary | ICD-10-CM

## 2014-09-10 DIAGNOSIS — F1024 Alcohol dependence with alcohol-induced mood disorder: Secondary | ICD-10-CM | POA: Diagnosis present

## 2014-09-10 DIAGNOSIS — F10239 Alcohol dependence with withdrawal, unspecified: Principal | ICD-10-CM | POA: Diagnosis present

## 2014-09-10 LAB — ECG 12-LEAD
Atrial Rate: 104 {beats}/min
P Axis: 68 degrees
P-R Interval: 132 ms
Q-T Interval: 322 ms
QRS Duration: 90 ms
QTC Calculation (Bezet): 423 ms
R Axis: 74 degrees
T Axis: 63 degrees
Ventricular Rate: 104 {beats}/min

## 2014-09-10 LAB — CBC AND DIFFERENTIAL
Basophils Absolute Automated: 0.01 10*3/uL (ref 0.00–0.20)
Basophils Automated: 0 %
Eosinophils Absolute Automated: 0.12 10*3/uL (ref 0.00–0.70)
Eosinophils Automated: 2 %
Hematocrit: 42.5 % (ref 42.0–52.0)
Hgb: 14.2 g/dL (ref 13.0–17.0)
Immature Granulocytes Absolute: 0.01 10*3/uL
Immature Granulocytes: 0 %
Lymphocytes Absolute Automated: 0.9 10*3/uL (ref 0.50–4.40)
Lymphocytes Automated: 17 %
MCH: 29.5 pg (ref 28.0–32.0)
MCHC: 33.4 g/dL (ref 32.0–36.0)
MCV: 88.4 fL (ref 80.0–100.0)
MPV: 10.9 fL (ref 9.4–12.3)
Monocytes Absolute Automated: 0.51 10*3/uL (ref 0.00–1.20)
Monocytes: 10 %
Neutrophils Absolute: 3.74 10*3/uL (ref 1.80–8.10)
Neutrophils: 71 %
Nucleated RBC: 0 /100 WBC (ref 0–1)
Platelets: 100 10*3/uL — ABNORMAL LOW (ref 140–400)
RBC: 4.81 10*6/uL (ref 4.70–6.00)
RDW: 17 % — ABNORMAL HIGH (ref 12–15)
WBC: 5.29 10*3/uL (ref 3.50–10.80)

## 2014-09-10 LAB — COMPREHENSIVE METABOLIC PANEL
ALT: 87 U/L — ABNORMAL HIGH (ref 0–55)
AST (SGOT): 66 U/L — ABNORMAL HIGH (ref 5–34)
Albumin/Globulin Ratio: 1.2 (ref 0.9–2.2)
Albumin: 3.7 g/dL (ref 3.5–5.0)
Alkaline Phosphatase: 107 U/L — ABNORMAL HIGH (ref 38–106)
BUN: 13 mg/dL (ref 9.0–28.0)
Bilirubin, Total: 0.7 mg/dL (ref 0.2–1.2)
CO2: 24 mEq/L (ref 22–29)
Calcium: 8.9 mg/dL (ref 8.5–10.5)
Chloride: 102 mEq/L (ref 100–111)
Creatinine: 1 mg/dL (ref 0.7–1.3)
Globulin: 3.2 g/dL (ref 2.0–3.6)
Glucose: 135 mg/dL — ABNORMAL HIGH (ref 70–100)
Potassium: 4.2 mEq/L (ref 3.5–5.1)
Protein, Total: 6.9 g/dL (ref 6.0–8.3)
Sodium: 141 mEq/L (ref 136–145)

## 2014-09-10 LAB — ETHANOL: Alcohol: 231 mg/dL — ABNORMAL HIGH

## 2014-09-10 LAB — ACETAMINOPHEN LEVEL: Acetaminophen Level: <6 ug/mL — ABNORMAL LOW (ref 10–30)

## 2014-09-10 LAB — GFR: EGFR: >60

## 2014-09-10 LAB — MAGNESIUM: Magnesium: 2.2 mg/dL (ref 1.6–2.6)

## 2014-09-10 LAB — SALICYLATE LEVEL: Salicylate Level: <5 mg/dL — ABNORMAL LOW (ref 15.0–30.0)

## 2014-09-10 LAB — GGT: GGT: 87 U/L — ABNORMAL HIGH (ref 12–64)

## 2014-09-10 LAB — HEMOLYSIS INDEX: Hemolysis Index: 46 — ABNORMAL HIGH (ref 0–18)

## 2014-09-10 MED ORDER — ATENOLOL 50 MG PO TABS
25.0000 mg | ORAL_TABLET | Freq: Two times a day (BID) | ORAL | Status: DC | PRN
Start: 2014-09-10 — End: 2014-09-13

## 2014-09-10 MED ORDER — ACETAMINOPHEN 650 MG RE SUPP
650.0000 mg | RECTAL | Status: DC | PRN
Start: 2014-09-10 — End: 2014-09-13

## 2014-09-10 MED ORDER — ALUM & MAG HYDROXIDE-SIMETH 200-200-20 MG/5ML PO SUSP
30.0000 mL | ORAL | Status: DC | PRN
Start: 2014-09-10 — End: 2014-09-13

## 2014-09-10 MED ORDER — ATORVASTATIN CALCIUM 10 MG PO TABS
40.0000 mg | ORAL_TABLET | Freq: Every evening | ORAL | Status: DC
Start: 2014-09-10 — End: 2014-09-13
  Administered 2014-09-10 – 2014-09-12 (×3): 40 mg via ORAL
  Filled 2014-09-10 (×3): qty 4

## 2014-09-10 MED ORDER — ONDANSETRON HCL 4 MG/2ML IJ SOLN
4.0000 mg | Freq: Once | INTRAMUSCULAR | Status: AC
Start: 2014-09-10 — End: 2014-09-10
  Administered 2014-09-10: 4 mg via INTRAVENOUS
  Filled 2014-09-10: qty 2

## 2014-09-10 MED ORDER — THIAMINE HCL 100 MG/ML IJ SOLN
100.0000 mg | Freq: Every day | INTRAMUSCULAR | Status: AC
Start: 2014-09-10 — End: 2014-09-13
  Administered 2014-09-10: 100 mg via INTRAMUSCULAR
  Filled 2014-09-10 (×2): qty 2

## 2014-09-10 MED ORDER — PROMETHAZINE HCL 25 MG PO TABS
25.0000 mg | ORAL_TABLET | ORAL | Status: DC | PRN
Start: 2014-09-10 — End: 2014-09-13

## 2014-09-10 MED ORDER — ACETAMINOPHEN 325 MG PO TABS
650.0000 mg | ORAL_TABLET | ORAL | Status: DC | PRN
Start: 2014-09-10 — End: 2014-09-13

## 2014-09-10 MED ORDER — OLANZAPINE 10 MG PO TBDP
10.0000 mg | ORAL_TABLET | ORAL | Status: DC | PRN
Start: 2014-09-10 — End: 2014-09-13

## 2014-09-10 MED ORDER — PANTOPRAZOLE SODIUM 40 MG PO TBEC
40.0000 mg | DELAYED_RELEASE_TABLET | Freq: Every morning | ORAL | Status: DC
Start: 2014-09-11 — End: 2014-09-13
  Administered 2014-09-11 – 2014-09-13 (×3): 40 mg via ORAL
  Filled 2014-09-10 (×3): qty 1

## 2014-09-10 MED ORDER — FOLIC ACID 1 MG PO TABS
1.0000 mg | ORAL_TABLET | Freq: Every day | ORAL | Status: DC
Start: 2014-09-10 — End: 2014-09-13
  Administered 2014-09-11 – 2014-09-13 (×3): 1 mg via ORAL
  Filled 2014-09-10 (×3): qty 1

## 2014-09-10 MED ORDER — CLONIDINE HCL 0.1 MG PO TABS
0.1000 mg | ORAL_TABLET | ORAL | Status: DC | PRN
Start: 2014-09-10 — End: 2014-09-13

## 2014-09-10 MED ORDER — LOSARTAN POTASSIUM 25 MG PO TABS
100.0000 mg | ORAL_TABLET | Freq: Every day | ORAL | Status: DC
Start: 2014-09-10 — End: 2014-09-13
  Administered 2014-09-10 – 2014-09-13 (×4): 100 mg via ORAL
  Filled 2014-09-10 (×4): qty 4

## 2014-09-10 MED ORDER — ACETAMINOPHEN 325 MG PO TABS
650.0000 mg | ORAL_TABLET | Freq: Once | ORAL | Status: DC
Start: 2014-09-10 — End: 2014-09-10
  Filled 2014-09-10: qty 2

## 2014-09-10 MED ORDER — OXAZEPAM 15 MG PO CAPS
15.0000 mg | ORAL_CAPSULE | ORAL | Status: DC | PRN
Start: 2014-09-12 — End: 2014-09-12

## 2014-09-10 MED ORDER — COLCHICINE 0.6 MG PO TABS
0.6000 mg | ORAL_TABLET | Freq: Every day | ORAL | Status: DC
Start: 2014-09-10 — End: 2014-09-13
  Administered 2014-09-12: 0.6 mg via ORAL
  Filled 2014-09-10 (×5): qty 1

## 2014-09-10 MED ORDER — PROMETHAZINE HCL 25 MG/ML IJ SOLN
25.0000 mg | INTRAMUSCULAR | Status: DC | PRN
Start: 2014-09-10 — End: 2014-09-13
  Administered 2014-09-10 – 2014-09-12 (×3): 25 mg via INTRAMUSCULAR
  Filled 2014-09-10 (×3): qty 1

## 2014-09-10 MED ORDER — LOPERAMIDE HCL 2 MG PO CAPS
2.0000 mg | ORAL_CAPSULE | ORAL | Status: DC | PRN
Start: 2014-09-10 — End: 2014-09-13

## 2014-09-10 MED ORDER — TRAZODONE HCL 50 MG PO TABS
50.0000 mg | ORAL_TABLET | Freq: Every evening | ORAL | Status: DC | PRN
Start: 2014-09-10 — End: 2014-09-13
  Administered 2014-09-11 – 2014-09-12 (×2): 50 mg via ORAL
  Filled 2014-09-10 (×2): qty 1

## 2014-09-10 MED ORDER — TAB-A-VITE/BETA CAROTENE PO TABS
1.0000 | ORAL_TABLET | Freq: Every day | ORAL | Status: DC
Start: 2014-09-10 — End: 2014-09-13
  Administered 2014-09-11 – 2014-09-13 (×3): 1 via ORAL
  Filled 2014-09-10 (×3): qty 1

## 2014-09-10 MED ORDER — ONDANSETRON 4 MG PO TBDP
ORAL_TABLET | ORAL | Status: AC
Start: 2014-09-10 — End: 2014-09-10
  Administered 2014-09-10: 4 mg via ORAL
  Filled 2014-09-10: qty 1

## 2014-09-10 MED ORDER — AMLODIPINE BESYLATE 5 MG PO TABS
5.0000 mg | ORAL_TABLET | Freq: Every day | ORAL | Status: DC
Start: 2014-09-10 — End: 2014-09-13
  Administered 2014-09-10 – 2014-09-13 (×4): 5 mg via ORAL
  Filled 2014-09-10 (×4): qty 1

## 2014-09-10 MED ORDER — THIAMINE HCL 100 MG PO TABS
100.0000 mg | ORAL_TABLET | Freq: Every day | ORAL | Status: DC
Start: 2014-09-10 — End: 2014-09-13
  Administered 2014-09-11 – 2014-09-13 (×3): 100 mg via ORAL
  Filled 2014-09-10 (×3): qty 1

## 2014-09-10 MED ORDER — HYDROCHLOROTHIAZIDE 25 MG PO TABS
25.0000 mg | ORAL_TABLET | Freq: Every day | ORAL | Status: DC
Start: 2014-09-11 — End: 2014-09-13
  Administered 2014-09-11 – 2014-09-13 (×3): 25 mg via ORAL
  Filled 2014-09-10 (×3): qty 1

## 2014-09-10 MED ORDER — MAGNESIUM HYDROXIDE 400 MG/5ML PO SUSP
30.0000 mL | Freq: Every evening | ORAL | Status: DC | PRN
Start: 2014-09-10 — End: 2014-09-13

## 2014-09-10 MED ORDER — SODIUM CHLORIDE 0.9 % IV BOLUS
1000.0000 mL | Freq: Once | INTRAVENOUS | Status: AC
Start: 2014-09-10 — End: 2014-09-10
  Administered 2014-09-10: 1000 mL via INTRAVENOUS

## 2014-09-10 MED ORDER — ONDANSETRON 4 MG PO TBDP
4.0000 mg | ORAL_TABLET | Freq: Once | ORAL | Status: AC
Start: 2014-09-10 — End: 2014-09-10

## 2014-09-10 MED ORDER — GUAIFENESIN ER 600 MG PO TB12
600.0000 mg | ORAL_TABLET | Freq: Two times a day (BID) | ORAL | Status: DC
Start: 2014-09-10 — End: 2014-09-13
  Administered 2014-09-10 – 2014-09-12 (×5): 600 mg via ORAL
  Filled 2014-09-10 (×6): qty 1

## 2014-09-10 MED ORDER — LORAZEPAM 2 MG/ML IJ SOLN
1.0000 mg | Freq: Once | INTRAMUSCULAR | Status: AC
Start: 2014-09-10 — End: 2014-09-10
  Administered 2014-09-10: 1 mg via INTRAVENOUS
  Filled 2014-09-10: qty 1

## 2014-09-10 MED ORDER — OXAZEPAM 15 MG PO CAPS
30.0000 mg | ORAL_CAPSULE | ORAL | Status: DC | PRN
Start: 2014-09-10 — End: 2014-09-12
  Administered 2014-09-10 – 2014-09-11 (×6): 30 mg via ORAL
  Filled 2014-09-10 (×6): qty 2

## 2014-09-10 NOTE — Progress Notes (Signed)
Nursing Admission Note:  Sean [name] reports an increase in alcohol use of 2 to 3 bottles of wine starting in the afternoon and going into evening until "sometimes pass out". He recently retired and is planning to move from his home with his wife. His friends staged an intervention and came to get him for treatment today. He was evaluated in the emergency department and medical clearance was obtained. He plans to go to [place] [facility] once discharged from this unit. He was evaluated by Dr Richardson Chiquito. Orders written and reviewed. Verbal consent to medicate given by Sean [name]. He was given Phenergan 25 mg IM for nausea and Serax 30 mg po for CIWA score 15 with tremors, nausea, anxiety and mild agitation. This was effective in reducing his withdarawal symptoms. He was given scheduled medications per order. He is able to tolerate fluids slowly and reports nausea as intermittent at this time. He was given a second dose of Serax 30 mg po with good effect. Report to staff at shifts end.

## 2014-09-10 NOTE — [facility]S Treatment History (Signed)
CHEMICAL DEPENDENCE AND MENTAL HEALTH TREATMENT HISTORY  INCLUDE BOTH INPATIENT AND OUTPATIENT TREATMENT ATTEMPTS            Facility Name Dates Presenting Problem Outcome/Years Sober   Father Norva Riffle 4-02/2014 Alcohol Bed to Bed   [place] 02/2014 Alcohol 2 months   [place]  2013 Alcohol 6 weeks

## 2014-09-10 NOTE — Progress Notes (Signed)
Psychiatric Evaluation Part I    Sean [name] is a 68 y.o. male admitted to the [facility] Emergency Department who was seen on 09/10/2014 by Haynes Bast, M.Ed., LPC, CSAC.    Call Details  Patient Location: [facility] ED  Patient Room Number: N 43  Time consult began: 1545  Time consult concluded: 1620  Referral Sources  Other Referral Sources: Friend ([name]/former colleagues brought him in)        Discharge Planning  Living Arrangements: Spouse/significant other  Support Systems: Spouse/significant other, Family members, Friends/neighbors  Type of Residence: Private residence  Patient expects to be discharged to:: Inpatient Detox [facility]    Presenting Mental Status  Orientation Level: Oriented X4  Memory: No Impairment  Thought Content: normal  Thought Process: normal  Behavior: agitated (Pt was not feeling well; vomitted several times during assessment)  Consciousness: Alert  Impulse Control: normal  Perception: normal  Eye Contact: normal  Attitude: cooperative  Mood: anxious  Hopelessness Affects Goals: No  Hopelessness About Future: No  Affect: normal  Speech: normal  Concentration: impaired  Insight: fair  Judgment: fair  Appearance: disheveled  Appetite: decreased  Weight change?: normal  Energy: decreased  Sleep: difficulty falling asleep  Reliability of Reporter/Patient: fair    Tool for Assessment of Suicide Risk  Individual Risk Profile: male  Symptom Risk Profile: anxiety  Interview Risk Profile: recent substance abuse  Protective Factors: intact marriage, supportive significant other, trusting relationship with treatment provider  Level of Suicide Risk: Low  Monitoring/Suicide Alert Level: Routine monitoring    Within the Last 6 Months:: no history of violence toward self  Greater than 6 Months Ago:: no history of violence toward self                Substance Abuse History  Previous Substance Abuse Treatment?: Yes  # of Previous Treatment Episodes: 3  Discharge Date from Previous Treatment?: 2 weeks  prior to this writing  Location of Previous Substance Abuse Treatment: [facility]  Self-Help Group Involvement  Current Involvement: AA  Past Involvement: AA  Sponsor: Yes  Substance Recovery Support  Have you been prescribed medications to support recovery?:  (Not specified)  Past Withdrawal Symptoms  Past Withdrawal Symptoms: Anxiety, Nausea/vomiting, Abdominal pain, Insomnia, Tremors  History of Blackouts?:  (Not specified)  History of Withdrawal Seizures?: No    Preliminary Diagnosis #1: Alcohol use disorder, severe, dependence                Violence Toward Others  Within the Last 6 Months:: no history of violence toward others  Greater than 6 Months Ago:: no history of violence toward others    Preliminary Diagnosis (DSM IV)  Axis I: Alcohol Dependence  Axis II: Deferred  Axis III: See medical chart  Axis IV: Primary support group, Social environment, Other (comment) (Other psycho-social stressors)    Summary:  Pt was brought to [facility] Emergency Department for medical clearance for [facility] inpatient detox.  Pt is a retired Development worker, community, and [name] who had transported Pt to the ED, were also physicians.  Pt reported a hx of alcohol use, with 3 prior treatment episodes, the last treatment being in [place] with discharge approximately 3-4 weeks prior to this writing.  Pt reported that he had stayed sober for approximately 10 days, but then had left [place] and come to [place], where he had lived for 30 years.  Pt reported that he had been drinking 2-3 bottles of wine daily for  the past 2 weeks, and that he had been in a hotel where no one could find him.  [name] stated that they have been encouraging Pt to come for detox since the prior week.  Pt denied any SI/HI and denied any psychiatric hx, although Pt reported that he has experienced anxiety at times, and that he believes that he has "obsessive compulsive traits."  Pt reported a hx of health issues, including fibromyalgia, gout, and heart issues.   Pt reported that he was not feeling well, and believes that he has a virus.  Pt vomited several times during the assessment and stated that he was also feeling "anxious."  Pt reported that his spouse is supportive, but that she doesn't know that he is here in the ED.  Pt stated that his aunt and uncle as well as several friends are supportive of his recovery.  Pt reported that he has been attending AA meetings and that he has a sponsor, although he has not contacted his sponsor about his relapse.  Pt will be a voluntary admission to the [facility]S program.  Pt's friend warn that Pt has left treatment facilities AMA in the past.  Dr. Richardson Chiquito met with Pt during psych assessment and has approved Pt admission pending medical clearance.    Disposition: D/C to [facility] Inpatient Detox - [facility] - Dr. Myrlene Broker Pre-authorization information:  Unable to obtain prior authorization.  Liaison and Tech made several attempts to contact Medicare for prior-authorization.  Per [facility]S Prescreen: " **Writer explained to pt and pt's 2 friends assessment for detox was not a guarantee of approval. [name] stated they understood and were willing to be self pay if pt did not get insurance approval. "  [facility] Counselor reported to Liaison that he had explained several times to Pt that Medicare may not cover detox.          Haynes Bast, M.Ed., [facility], [facility]  Psychiatric Liaison

## 2014-09-10 NOTE — ED Provider Notes (Signed)
EMERGENCY DEPARTMENT HISTORY AND PHYSICAL EXAM    Date: 09/16/2014  Patient Name: Sean [name]  Attending Physician: Orlinda Blalock, MD    Diagnosis and Treatment Plan       Clinical Impression:   1. Alcohol abuse        Treatment Plan:   ED Disposition     Transfer to Another Facility Sean [name] should be transferred out to [facility]. Dr. Jillyn Ledger accepting.              History of Presenting Illness     Chief Complaint   Patient presents with   . Inpatient Detox  Alcohol abuse       Sean [name] is a 68 y.o. male brought into ED by two friends who already called [facility] and was told to bring pt here for EtOH detox/medical eval. Pt reports daily binge drinking, last drink at 0200 today with associated nausea, dry heaving, cough, cold, rhinorrhea. Pt also reports decreased PO intake. Pt denies diarrhea, flu shot this year, shakes or seizure sxs, taking any medications today. Denies SI/HI.     Duration: 1 week  Onset: gradual  Quality: aching, nauseas  Maximum Severity: moderate  Modifying Factors: minimal relief with rest  History Obtained From: patient, friends    PCP: [name], [name], MD    Past Medical History     Past Medical History   Diagnosis Date   . Hypertension    . GERD (gastroesophageal reflux disease)    . Gout    . Hyperlipemia    . Hx of radical prostatectomy    . Hydrocele    . Malignant neoplasm      hx prostate cancer   . Aortic stenosis    . Fibromyalgia    . Anxiety      Past Surgical History   Procedure Laterality Date   . Appendectomy  1951   . Hydrocele excision / repair  1980s   . Prostatectomy, radical  2005       Family History     Family History   Problem Relation Age of Onset   . Alcohol abuse Father    . Depression Sister        Social History     History     Social History   . Marital Status: Single     Spouse Name: N/A     Number of Children: N/A   . Years of Education: N/A     Social History Main Topics   . Smoking status: Never Smoker    . Smokeless tobacco: Not on file   .  Alcohol Use: 3.0 oz/week     5 Glasses of wine per week   . Drug Use: No   . Sexual Activity: Not on file     Other Topics Concern   . Not on file     Social History Narrative       Allergies     No Known Allergies    Medications     No current facility-administered medications for this encounter.  Current outpatient prescriptions: amLODIPine (NORVASC) 5 MG tablet, 10 mg.  , Disp: , Rfl: ;  atorvastatin (LIPITOR) 40 MG tablet, , Disp: , Rfl: ;  COLCRYS 0.6 MG tablet, , Disp: , Rfl: ;  hydrochlorothiazide (HYDRODIURIL) 25 MG tablet, , Disp: , Rfl: ;  losartan (COZAAR) 100 MG tablet, , Disp: , Rfl: ;  omeprazole (PRILOSEC) 20 MG capsule, , Disp: , Rfl:  traZODone (DESYREL) 50 MG tablet, Take 1 tablet (50 mg total) by mouth nightly as needed for Sleep., Disp: 15 tablet, Rfl: 0    Review of Systems     Review of Systems : All systems were reviewed and are negative for acute complaints, except as described above.     Physical Exam   BP 151/88 mmHg  Pulse 98  Temp(Src) 98.4 F (36.9 C)  Resp 18  Ht 1.778 m  Wt 97.523 kg  BMI 30.85 kg/m2  SpO2 97%  Constitutional: Patient is febrile. Vital signs reviewed. Well appearing.   Head: Atraumatic. Normocephalic.   Eyes: Eyes are normal to inspection. Sclera are nl.   Respiratory/Chest: Breath sounds are normal. No respiratory distress.  Cardiovascular: Hearts sounds normal. Normal S1 S2. Tachycardic.  Abdomen: Abdomen is non-tender. No peritoneal signs.  Upper Extremity: Inspection normal. No cyanosis.  Lower Extremity: Inspection normal. No edema  Neuro: GCS is 15. Speech normal.  Skin: Skin is warm. Skin is dry.  Psychiatric: Oriented X 3. Normal affect.       Diagnostic Study Results     Labs -  Results     Procedure Component Value Units Date/Time    Acetaminophen level [[id number]]  (Abnormal) Collected:  09/10/14 1541    Specimen Information:  Blood Updated:  09/10/14 1618     Acetaminophen Level <6 (L) ug/mL     Salicylate level [[id number]]  (Abnormal) Collected:   09/10/14 1541    Specimen Information:  Blood Updated:  09/10/14 1618     Salicylate Level <5.0 (L) mg/dL     Comprehensive metabolic panel [[id number]]  (Abnormal) Collected:  09/10/14 1541    Specimen Information:  Blood Updated:  09/10/14 1618     Glucose 135 (H) mg/dL      BUN 46.9 mg/dL      Creatinine 1.0 mg/dL      Sodium 629 mEq/L      Potassium 4.2 mEq/L      Chloride 102 mEq/L      CO2 24 mEq/L      CALCIUM 8.9 mg/dL      Protein, Total 6.9 g/dL      Albumin 3.7 g/dL      AST (SGOT) 66 (H) U/L      ALT 87 (H) U/L      Alkaline Phosphatase 107 (H) U/L      Bilirubin, Total 0.7 mg/dL      Globulin 3.2 g/dL      Albumin/Globulin Ratio 1.2     Ethanol (Alcohol) Level [[id number]]  (Abnormal) Collected:  09/10/14 1541    Specimen Information:  Blood Updated:  09/10/14 1618     Alcohol 231 (H) mg/dL     GFR [[id number]] Collected:  09/10/14 1541     EGFR >60.0 Updated:  09/10/14 1618    CBC and differential [[id number]]  (Abnormal) Collected:  09/10/14 1541    Specimen Information:  Blood / Blood Updated:  09/10/14 1604     WBC 5.29 x10 3/uL      RBC 4.81 x10 6/uL      Hgb 14.2 g/dL      Hematocrit 59.5 %      MCV 88.4 fL      MCH 29.5 pg      MCHC 33.4 g/dL      RDW 17 (H) %      Platelets 100 (L) x10 3/uL      MPV 10.9 fL      Neutrophils 71 %  Lymphocytes Automated 17 %      Monocytes 10 %      Eosinophils Automated 2 %      Basophils Automated 0 %      Immature Granulocyte 0 %      Nucleated RBC 0 /100 WBC      Neutrophils Absolute 3.74 x10 3/uL      Abs Lymph Automated 0.90 x10 3/uL      Abs Mono Automated 0.51 x10 3/uL      Abs Eos Automated 0.12 x10 3/uL      Absolute Baso Automated 0.01 x10 3/uL      Absolute Immature Granulocyte 0.01 x10 3/uL           Radiologic Studies -  XR Chest  AP Portable   Final Result    Negative for acute process.      Gerlene Burdock, MD    09/10/2014 4:31 PM               Clinical Course in the Emergency Department     Discharge note:  Pt reevaluated prior to discharge. ambulating  without difficulty. Tolerating po. Discharge instructions reviewed with the patient. Pt appears stable for discharge and has been instructed to follow up with PCP or return to the ED if their symptoms worsen or they have any concerns.       Medical Decision Making     Amount and/or Complexity of Data Reviewed  First MD: Orlinda Blalock, MD  I am the first provider for this patient  I have reviewed the vital signs, past medical hx, past surgical hx, social hx, family hx.  Clinical lab tests ordered and reviewed by myself: yes  All lab results interpreted by myself: yes  All radiology ordered and reviewed by myself: yes  All radiologic studies interpreted by myself: yes  Independent visualization of images, tracings, or specimens: yes  Discuss the patient with other providers: yes  Obtained patient's old records: yes  Nursing notes reviewed: yes  O2 sat reviewed : normal      _______________________________    Attestations:    I was acting as a Neurosurgeon for [employer] on [employer]      I am the first provider for this patient and I personally performed the services documented.  Belva Agee is scribing for me on [facility].This note accurately reflects work and decisions made by me.   Orlinda Blalock, MD      _______________________________                        Orlinda Blalock, MD  09/16/14 [phone]

## 2014-09-10 NOTE — ED Notes (Signed)
Psych laison at bedside

## 2014-09-10 NOTE — ED Notes (Signed)
I have briefly evaluated this patient as triage physician in order to facilitate and initiate the ordering of laboratory and imaging studies as needed.    Beverley Fiedler, MD      Alcide Evener, MD  09/10/14 [phone]

## 2014-09-10 NOTE — Progress Notes (Signed)
Date/Time: 09/10/2014, 12:13 PM  Interviewer: [employer]  Patient Name: Sean [name]   DOB: [birth year]  SSN: [id number]  Gender: male  Patient Address:  [address] [place] [address]  Patient Phone Number(s): [phone], additional phone number:   Emergency Contact:Corrie Dandy , Phone number: [phone]  Relationship to Pt:  wife  May we contact this person regarding your admission? yes    Caller: self  , Caller phone:above, Caller relationship:  Reason for Call: detox  Referral Source: [facility]  Notes: Pt reports plan is to return to [facility] after [facility]    Insurance Info:   Company: Theme park manager number: Providers:  Mental Health/SA:   Stage manager [facility]):   self   Relationship to PT:     PH DOB:   Member ID: [id number]  Group #:      Insured Employer:   Insurance Address: [address]     Scientist, clinical (histocompatibility and immunogenetics) explained to pt and pt's 2 friends assessment for detox was not a guarantee of approval.  [name] stated they understood and were willing to be self pay if pt did not get insurance approval.        Clinical Information  Presenting Problem: etoh  Precipitating Event: Pt was referred by [facility].  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      Suicidal/Homicidal Risk?    Any hx of Suicidal thoughts or plans? No    Any hx of Homicidal Thoughts or Plans?  No    Imminent Risk of Suicide and/or Harm to Others?  No         Drug Use   Any current use of alcohol or drugs? alcohol    Do you use tobacco?: No    Drug of Choice # 1: Etoh  Pattern of use: daily 2-3 bottles wine daily 3-4 years  Last use? 09/10/2014    Drug of Choice # 2 :   Pattern of use:   Last use?     Drug of Choice # 3 :   Pattern of use:   Last use?:     Additional drug use info:     Current Withdrawal Symptoms:  Anxiety, tachycardia, shakes, sweats    History of and last date of:    Hallucinations? No          Seizures?   No    Psych/Mental health Issues:   Anxiety, Depression      Medical issues:   Tachycardia, high blood pressure, pacemaker, HTN,     Current Medication (medical or psychiatric):   HCTZ, Amlodopine, Duloxetine, Buproprian, Losartin    Additional Notes:     Does the patient require Hard of Hearing Services? No    Does the patient require language services? No    Preliminary Diagnosis: Alcohol Use Disorder, Severe    Recommended Level of Care: Detox    Appointment with: Psych Liaison      Location: [facility]             Date: 09/10/2014             Time: 3pm    * Current IOP counselors that do assessments:   Garr, Laroy Apple, Ruben Reason    * DTX appointments 8am or 10am Mon-Fri ONLY (Do not include "step down" appointments in the scheduling process)

## 2014-09-10 NOTE — Discharge Instructions (Signed)
You are medically cleared for [facility]      Alcohol Abuse    You have been diagnosed with alcohol abuse.    This is a serious problem that can be life-threatening. Alcohol abuse can ruin your life and the lives of those who care about you.    Alcoholism is an addiction. Recent research says that the tendency to become addicted to alcohol may be genetic, which means that it can be passed from one generation to the next.    It is important to have a counselor and a family doctor who see you on a regular basis. A counselor can help you with your problems, keep a close eye on you, and follow your progress. The medical staff may give you a list of phone numbers to call if you feel that you need to talk to someone before your arranged follow-up appointment.    DO NOT DRIVE A VEHICLE UNDER THE INFLUENCE OF ALCOHOL! YOU MAY INJURE OR KILL YOURSELF OR SOMEONE ELSE IF YOU DRINK AND DRIVE. AND IT IS AGAINST THE LAW!    Your alcohol level is TOO HIGH for you to drive yourself home right now.    YOU SHOULD SEEK MEDICAL ATTENTION IMMEDIATELY, EITHER HERE OR AT THE NEAREST EMERGENCY DEPARTMENT, IF ANY OF THE FOLLOWING OCCURS:   You can't control your shaking or you hallucinate (see or hear things that other people do not see or hear).   You have thoughts of harming or killing yourself.    You vomit three times or more or you see blood in your vomit or your stool. Blood in the stool can be bright red, dark red, or black and tar-like.   You have abdominal (belly) pain or you start to have fever (temperature higher than 100.22F / 38C).   You do not feel safe at home or you feel you might hurt yourself or use alcohol again.   You become worse and feel that you cannot wait until your follow-up appointment for treatment.

## 2014-09-10 NOTE — H&P (Signed)
[facility] - HISTORY and PHYSICAL    Date/Time:  09/10/2014  10:27 PM  Patient Name: Sean [name], Sean [name]  MRN:  [id number]  Age: 68 y.o.  DOB: [birth year]    Vital Signs:     Filed Vitals:    09/10/14 2153   BP: 152/93   Pulse: 91   Temp:    Resp: 20         Allergies:   No Known Allergies    Chief Complaints:   Anxiety  Nausea  Dry Heaves  Cough  Cold Rhinitis  Shakes  Tremors      Current Health Problems:   This is a 68 year old male admitted from the ER with alcohol withdrawal.  Patient was recently discharged from residential program in [place].  He relapsed 12 days ago on wine.  He denied seizures or hallucinations.  He has a history of Hypertension, GERD, Gout,  Hyperlipidemia. He has Aortic Stenosis, and a demand pacemaker.  He denied other medical problems.  He is anxious.  He does not smoke.      Review of Systems:   A comprehensive review of systems was negative except for:  Constitutional: positive for chills and sweats  Eyes: Negative  Ears, nose, mouth, throat, and face: Negative  Respiratory: Negative  Cardiovascular: Negative  Gastrointestinal: Negative  Integument/breast: Negative  Hematologic/lymphatic: Negative  Musculoskeletal: Negative  Neurological: positive for tremors and weakness  Behavioral/Psych: positive for anxiety and excessive alcohol consumption  Endocrine: Negative      History of Past Illnesses (Serious illnesses, injuries, surgeries, hospitalizations, including psychiatric admissions):     Past Medical History   Diagnosis Date   . Hypertension    . GERD (gastroesophageal reflux disease)    . Gout    . Hyperlipemia    . Hx of radical prostatectomy    . Hydrocele    . Malignant neoplasm      hx prostate cancer   . Aortic stenosis    . Fibromyalgia    . Anxiety        Family Medical History:     Family History   Problem Relation Age of Onset   . Alcohol abuse Father    . Depression Sister        Social History:     History   Alcohol Use   . 3.0 oz/week   . 5 Glasses of wine per week     History    Drug Use No     History   Smoking status   . Never Smoker    Smokeless tobacco   . Not on file         Physical Exam:     General appearance - oriented to person, place, and time     Mental status -  Anxious mood, guarded  Eyes - pupils equal and reactive, extraocular eye movements intact  Ears - bilateral TM's and external ear canals normal  Nose - no septal perforation  Mouth - mucous membranes moist, pharynx normal without lesions  Neck - supple, no significant adenopathy  Chest - clear to auscultation, no wheezes, rales or rhonchi, symmetric air entry  Heart - normal rate, regular rhythm, normal S1, S2, grade 2/6 SE murmurs, rubs, clicks or gallops  Abdomen - soft, nontender, nondistended, no masses or organomegaly  GU Male - deferred to PCP  Rectal - deferred to PCP.  Neurological - alert, oriented, normal speech, no focal findings or movement disorder noted, screening mental status  exam normal, neck supple without rigidity, cranial nerves II through XII intact, motor and sensory grossly normal bilaterally, normal muscle tone, moderate tremors, strength 5/5  Musculoskeletal - no joint tenderness, deformity or swelling  Extremities - peripheral pulses normal, no pedal edema, no clubbing or cyanosis  Skin - flushed    Mental Status Exam:   Appearance: No apparent distress  Behavior/relationship to examiner/demeanor:  Guarded  Motor activity/EPS:  Normal  Gait:   Normal  Mood (subjective report):   anxious  Affect (objective appearance):  Appropriate/mood-congruent  Fund of Knowledge/Intelligence:   Average  Abstraction:   Normal  Insight:   Fair  Judgment:   Fair      Clinical Diagnosis:    Axis I: Alcohol dependence with unspecified alcohol-induced disorder and Anxiety disorder, unspecified anxiety disorder type   Axis GN:FAOZH   Axis III: HTN, Gout, GERD, Hyperlipidemia   Axis IV: Other psychosocial or environmental problems   Problems with primary support group   Axis V:41-50: serious symptoms.:  Highest in the last year 65.    This patient has been informed of their individual treatment plan.  This includes an explanation of the action of all prescribed psychoactive medications.    List of Prescribed Psychoactive Medications (Antidepressants, Mood Stabilizers, Antipsychotics, Antianxiety, Stimulants):   Serax  Trazodone  Zydis      The benefits, potential side effects, risks, and possible drug interactions were explained to this patient who indicated that he understood and agrees to the plan of care.   All patient questions were answered.    Plan:  Admit to Detoxification  Serax Protocol  Substance Abuse Counseling  IOP Triage  Discuss with treatment team  Review labs with patient        Signed by: Nicholaus Bloom, MD   09/10/2014 10:27 PM

## 2014-09-11 LAB — HEPATITIS B SURFACE ANTIGEN W/ REFLEX TO CONFIRMATION: Hepatitis B Surface Antigen: NONREACTIVE

## 2014-09-11 LAB — PLATELET COUNT: Platelets: 74 10*3/uL — ABNORMAL LOW (ref 140–400)

## 2014-09-11 LAB — HEPATITIS C ANTIBODY: Hepatitis C, AB: NONREACTIVE

## 2014-09-11 LAB — HIV AG/AB 4TH GENERATION: HIV Ag/Ab, 4th Generation: NONREACTIVE

## 2014-09-11 LAB — HEMOLYSIS INDEX: Hemolysis Index: 5 (ref 0–18)

## 2014-09-11 MED ORDER — TRAZODONE HCL 50 MG PO TABS
50.0000 mg | ORAL_TABLET | Freq: Every evening | ORAL | Status: AC | PRN
Start: 2014-09-11 — End: ?

## 2014-09-11 NOTE — Plan of Care (Signed)
Problem: Safe medical management of withdrawal from (list substances of abuse) AS EVIDENCED BY...  Goal: Compliance with management of withdrawal syndrome  Intervention: Assess withdrawal signs/symptoms according to identified protocol e.g. CIWA/COWS  Sean [name] was assessed q2h per protocol day 1. CIWA = 10, 6 and 10  for nausea, tremors, anxiety and sweats. He was medicated with Serax 30 mg at 00:10 and 06:27 for alcohol withdrawal. He was encouraged to report withdrawal symptoms and increase fluid intake. He has been sleeping throughout the night without any signs of discomfort or distress noted as observed on hourly rounding. RN will continue to monitor.

## 2014-09-11 NOTE — Plan of Care (Signed)
Problem: Safe medical management of withdrawal from (list substances of abuse) AS EVIDENCED BY...  Goal: Compliance with management of withdrawal syndrome  0700-1530 shift assessment notes: Assessed Q 2 hours per unit protocol for alcohol withdrawal. VS are stable with CIWA score of 6, 2. Patient reported moderate anxiety, tremors and sweats. Patient reported feeling tires and has been sleeping most of the time during this shift. Patient is steady on feet, ambulating without equipment  and denied feeling dizzy. Patient was medicated with scheduled BP meds and vitamins with good effect. Educated about health risks associated with withdrawal syndrome and the importance of reporting symptom of withdrawal to care providers. Verbalized understanding. Will continue to monitor for safety.  Lanier Prude, RN, MSN

## 2014-09-11 NOTE — Progress Notes (Signed)
[facility]  Integrated Summary of Assessments    Patient Name:  Sean [name], Sean [name]  Date of Birth: [birth year]  Medical Record Number:  [id number]  Level of Care: Inpatient  Admission Date: 09/10/2014    Initial Justification for Treatment: Sean [name] is a 68 y.o. married Caucasian male presenting with diagnosis of Axis I: Alcohol dependence with unspecified alcohol-induced disorder and Anxiety disorder, unspecified anxiety disorder type and treatment. This was patient's third treatment episode at [facility] and fourth treatment episode overall. Patient reports the Presenting Problem: Alcohol Use Disorder Patient Stated Goal: My goal is sobriety, not a dry drunk, real sobriety. I don't want to be a miserable person.    Detailed Drug Use History  Any current alcohol and/or drug use?: Yes  Reason for Alcohol or Drug Use: Pleasure, relaxation, I like it.   Alcohol Use: In Past Two Weeks  Cans of beer per week: 0  Glasses of wine per week: 56  Shots of liquor per week: 0  Drinks containing 1/2 oz alcohol per week: 0  Total Ounces of Alcohol Per Week: 33.6             Age first used alcohol:  : 60            Max used in a day: 4 bottles of wine            Pattern of use:: 2 bottles of wine a day            Last use:: 2 AM today            Longest Abstinence:  : 1 to 2 years  Tranquilizers/Benzodiazepines:: N/A  Tranquilizers/Benzodiazepines #2:: N/A  Tranquilizers/Benzodiazepines #3: N/A  Heroin Use: N/A  Rx Opiates #1: : N/A  Rx Opiates #2: : N/A  Rx Opiates #3: : N/A  Opiates Use #4:: N/A  Opiates Use #5:: N/A  Opiates Use #6:: N/A  Non-Rx Methadone Use: N/A  Amphetamine Use: N/A  Marijuana Use: N/A  Cocaine Use: N/A  Hallucinogens Use: N/A  Inhalants Use: N/A  PCP Use: N/A  Nicotine Use: N/A  Barbiturates Use: N/A  Other Substance Abuse: N/A  Other Substance Abuse #2:: N/A  Other Substance Abuse #3:: N/A  Caffeine use:: diet coke 6 to 8 a day            Age first used:: 22            Years of use:: 59            Max used  in a day:  : 8            Pattern of use:: daily            Last use:: today            Longest Abstinence:  : 8 hours when I sleep.     Current Withdrawal Symptoms: Anxiety, Irritability, Nausea/vomiting, Tremors, Sweats, Fatigue  Past Withdrawal Symptoms: Anxiety, Irritability, Nausea/vomiting, Abdominal pain, Diarrhea, Hypersomnia, Insomnia, Sweats, Chills, Tremors, Yawning, Fatigue, Headache, Joint/Bodyaches  History of Blackouts?: Yes  History of Withdrawal Seizures?: No    Biopsychosocial Summary of Assessments:   At time of assessment, patient does not report current SI/HI. Patient does not report past SI/HI. Patient denies history of self-harm or injurious behavior. Patient denies family history of suicide. Patient denies history of mental health problems. No issues of physical, emotional, or sexual abuse are reported.     Patient reports current living  situation is single family home and that he lives with wife. Patient denies this is a sober environment. Patient reports he does not currently have a sponsor. Patient reports he participates in reading,outdoors,gardening and classic corvette for leisure activities. Patient describes current social relationships as its brought a lot of concern in my friends lives. Patient denies legal history. 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 Patient is Retired. Patient does not report job consequences as a result of his substance abuse. Patient does report financial problems currently I have spent a lot of money even thought I am retired. 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 Patient reports last time he saw his medical provider was one year ago for PCP and three H&P in the past months.      Precipitating Event:   Patient reports precipitating event for coming to [facility] was "It was my friends who were worried about me. They came in and would not leave  until I came with them."     Patient's perception of problems and needs: Patient reports perception of addiction as "it is a progressive lethal disease that can be treated but not cured."    Patient strengths and assets:  Patient reports strengths as intelligent,hardworking,I don't give up easily.     Patient Stated Goal: My goal is sobriety, not a dry drunk, real sobriety. I don't want to be a miserable person.     Patient Stage of Change:  Patient presents in the a contemplative stage of change as evidenced by pt wanting to go to [facility].    Final Five Axis Diagnosis:   Axis I: Alcohol dependence with unspecified alcohol-induced disorder and Anxiety disorder, unspecified anxiety disorder type   Axis YN:WGNFA   Axis III: HTN, Gout, GERD, Hyperlipidemia   Axis IV: Other psychosocial or environmental problems   Problems with primary support group   Axis V:41-50: serious symptoms.: Highest in the last year 65.       Recommendations:   Education on: relapse/recovery processes, effects of drug use, defense mechanisms, post-acute withdrawal.   Development of a safe, sober, and supportive network and activities.   Participation in follow-up aftercare to continue education, develop new coping skills, and create structure .

## 2014-09-11 NOTE — Progress Notes (Signed)
BPS Note: This counselor met with pt 1:1 to complete BPS interview. Pt presented as sluggish but receptive. Pt was orientated to person,place and time. Pt reports the precipitating event for seeking treatment as It was my friends who were worried about me. They came in and would not leave until I came with them."   Pt reports no current/past SI/HI. Pt currently lives with wife. The patient level of support is friends and family. Pt is retired. Pt reports the following aftercare plan treatment.  Pt presents in the contemplative stage of change as evidenced by pt wanting to go to [facility]. Pt was assessed and provided with appropriate treatment materials including What to Expect at [facility],Step One, relapse/recovery processes, effects of drug use, defense mechanisms, post-acute withdrawal. Pt's treatment plan will focus on recovery and relapse.  Pt treatment plan was completed,signed dated and placed in chart.    Counseling Note: Pt did not attend community group this morning and reported feeling some withdrawals. Pt is working on the Step One task and is not ready to present today. Pt shared that he did not have a sponsor, did not make any phone calls, and did not go to a meeting yesterday.  Pt said that he is grateful for being alive.  Counselor later met with the pt, and pt presented as tire but open to conversation. Pt stated he could not fine his credit card that he gave to "finance lady". Charge nurse was notified. Pt has been feel weak and has been resting most of the day.    Transition Planning Note: Counselor and pt discussed plan for aftercare following discharge is [facility].Pt does not have a consent for [facility] and did not indicate how it would be paid for.           Family Interview Note:   Pt declined for family interview due to the fact that his wife is out of the state.

## 2014-09-11 NOTE — Plan of Care (Signed)
Problem: Safe medical management of withdrawal from (list substances of abuse) AS EVIDENCED BY...  Goal: Compliance with management of withdrawal syndrome  1500-1930 shift assessment notes: Patient continues to have mild to moderate elevated BP of 165/97 with pulse rate of 96. Recheck at 1810 BP was 153/100 with pulse rate of 93. Patient reported moderate anxiety, agitation, sweats, nausea and vomiting at 1600. Patient was medicated with phenergan 0.5 mg for nausea and vomiting  and serax 30 mg at 1604 for CIWA score of 10 with good effect. Patient was also medicated with serax 30 mg 2 hours later for CIWA score of 10 with good effect. Patient was educated about health risks associated with withdrawal syndrorme. Verbalized understanding.Will continue to monitor for safety.   Lanier Prude, RN, MSN

## 2014-09-11 NOTE — Progress Notes (Signed)
PROGRESS NOTE    Date Time: 09/11/2014 6:39 AM  Patient Name: Sean [name]      Chief Complaint:   Anxiety  Tremors  Shakes  Ataxia  Denies hallucinations        Assessment:   Alcohol Use Disorder  Alcohol Withdrawal  Hypertension  Alcoholic Liver Disease          Plan:   Continue with Detoxification  Serax Protocol  SA Counseling  Discussed with treatment team  Review labs with patients  IOP Triage  Discharge planning             Medications:     Current Facility-Administered Medications   Medication Dose Route Frequency   . amLODIPine  5 mg Oral Daily   . atorvastatin  40 mg Oral QHS   . colchicine  0.6 mg Oral Daily   . folic acid  1 mg Oral Daily   . guaiFENesin  600 mg Oral Q12H SCH   . hydrochlorothiazide  25 mg Oral Daily   . losartan  100 mg Oral Daily   . multivitamin  1 tablet Oral Daily   . pantoprazole  40 mg Oral QAM AC   . thiamine  100 mg Intramuscular Daily   . thiamine  100 mg Oral Daily       Review of Systems:   A comprehensive review of systems was negative except for:   Respiratory: Negative  Cardiovascular: Negative  Gastrointestinal: Negative  Genitourinary: Negative  Musculoskeletal: Negative  Neurological: positive for gait problems, tremors and weakness    Physical Exam:     Filed Vitals:    09/11/14 0607   BP: 139/83   Pulse: 90   Temp:    Resp: 16       General appearance - oriented to person, place, and time   Mental status -  Anxious mood, guarded  Eyes - pupils equal and reactive, extraocular eye movements intact  Ears - bilateral TM's and external ear canals normal  Nose - no septal perforation  Mouth - mucous membranes moist, pharynx normal without lesions  Neck - supple, no significant adenopathy  Chest - clear to auscultation, no wheezes, rales or rhonchi, symmetric air entry  Heart - normal rate, regular rhythm, normal S1, S2, no murmurs, rubs, clicks or gallops  Abdomen - soft, nontender, nondistended, no masses or organomegaly  Rectal - deferred to PCP.  Neurological -  alert, oriented, normal speech, no focal findings or movement disorder noted, screening mental status exam normal, neck supple without rigidity, cranial nerves II through XII intact, motor and sensory grossly normal bilaterally, normal muscle tone, moderate tremors, strength 5/5, ATAXIC  Musculoskeletal - no joint tenderness, deformity or swelling  Extremities - peripheral pulses normal, no pedal edema, no clubbing or cyanosis  Skin - normal      Labs:     Results     Procedure Component Value Units Date/Time    GGT [[id number]]  (Abnormal) Collected:  09/10/14 1541     Gamma Gluten Transferase 87 (H) U/L Updated:  09/10/14 2316    Magnesium [[id number]] Collected:  09/10/14 1541    Specimen Information:  Blood Updated:  09/10/14 2316     Magnesium 2.2 mg/dL     Hemolysis index [[id number]]  (Abnormal) Collected:  09/10/14 1541     Hemolysis Index 46 (H) Updated:  09/10/14 2238              Signed by: Nicholaus Bloom, MD

## 2014-09-12 LAB — RPR: RPR: NONREACTIVE

## 2014-09-12 LAB — HEPATIC FUNCTION PANEL
ALT: 46 U/L (ref 0–55)
AST (SGOT): 33 U/L (ref 5–34)
Albumin/Globulin Ratio: 1.2 (ref 0.9–2.2)
Albumin: 3.1 g/dL — ABNORMAL LOW (ref 3.5–5.0)
Alkaline Phosphatase: 101 U/L (ref 38–106)
Bilirubin Direct: 0.4 mg/dL (ref 0.0–0.5)
Bilirubin Indirect: 0.4 mg/dL (ref 0.0–1.0)
Bilirubin, Total: 0.8 mg/dL (ref 0.1–1.2)
Globulin: 2.5 g/dL (ref 2.0–3.7)
Protein, Total: 5.6 g/dL — ABNORMAL LOW (ref 6.0–8.3)

## 2014-09-12 LAB — HEMOLYSIS INDEX: Hemolysis Index: 5 (ref 0–18)

## 2014-09-12 LAB — PLATELET COUNT: Platelets: 87 10*3/uL — ABNORMAL LOW (ref 140–400)

## 2014-09-12 LAB — GGT: GGT: 76 U/L — ABNORMAL HIGH (ref 12–64)

## 2014-09-12 MED ORDER — OXAZEPAM 15 MG PO CAPS
15.0000 mg | ORAL_CAPSULE | ORAL | Status: DC | PRN
Start: 2014-09-12 — End: 2014-09-12

## 2014-09-12 MED ORDER — OXAZEPAM 15 MG PO CAPS
15.0000 mg | ORAL_CAPSULE | ORAL | Status: DC | PRN
Start: 2014-09-12 — End: 2014-09-13
  Administered 2014-09-12 – 2014-09-13 (×3): 15 mg via ORAL
  Filled 2014-09-12 (×3): qty 1

## 2014-09-12 NOTE — Plan of Care (Signed)
Pt was monitored and assessed for Alcohol withdrawal throughout the night. His vitals has being stable, BP 114/77, Pulse 87 and CIWA scores were 5 and 4. Pt has minimal withdrawal symptoms of mild tremor.  Pt slept well during the night. No sign of distress noted.

## 2014-09-12 NOTE — Plan of Care (Signed)
Problem: Safe medical management of withdrawal from (list substances of abuse) AS EVIDENCED BY...  Goal: Compliance with management of withdrawal syndrome  Intervention: Assess withdrawal signs/symptoms according to identified protocol e.g. CIWA/COWS  Pt was assessed for Alcohol withdrawal per CIWA protocol, score were at 2025 with tremors, anxiety and mild agitation. He denies any severe withdrawal symptoms and did not received any prn for withdrawal. He was reassessed at 2331 and 0036 and his scores were 5 and 4. Pt;s vital signs has being stable with BP 132/93 and pulse of 98. Pt  Was observed walking in and out of the bathroom unassisted. He requested for Trazodone 50 mg for sleep at 2332. Pt went to bed afterwards and he is comfortably sleeping. Staff will monitor and offer support.

## 2014-09-12 NOTE — Plan of Care (Signed)
Pt assessed per CIWA protocol for alcohol  withdrawal symptoms. CIWA score was 7 for anxiety, tremors, sweat and agitation. Pt medicated with Serax 15 mg for increased anxiety @2156  and Trazodone 50mg  @2151  for sleep. All scheduled medicines were given as ordered. Pt participated in the group meetings in the unit and also socialized with peer. Plan of care reviewed, pt expressed understanding will continue to monitor and follow current plan of care.

## 2014-09-12 NOTE — Discharge Summary (Signed)
DISCHARGE SUMMARY    Date Time:   09/13/2014      6.00 am  Patient Name: Sean [name]  Attending Physician: Nicholaus Bloom, MD    Date of Admission:   09/10/2014    Date of Discharge:   09/13/2014    Reason for Admission:   Abnormal clinical finding [R68.89]    Discharge Dx:   Alcohol Use Disorder  Alcohol Withdrawal  Anxiety  Hypertension    Hospital Course:   This is a 68 year old male admitted for Alcohol  Withdrawal.  Patient denied seizures or hallucinations.  He was recently in a [place] program.  He plans to return to [facility] for IOP.  His prognosis is guarded.      Physical Examination:   General appearance - oriented to person, place, and time   Mental status -  Anxious mood, guarded  Eyes - pupils equal and reactive, extraocular eye movements intact  Ears - bilateral TM's and external ear canals normal  Nose - no septal perforation  Mouth - mucous membranes moist, pharynx normal without lesions  Neck - supple, no significant adenopathy  Chest - clear to auscultation, no wheezes, rales or rhonchi, symmetric air entry  Heart - normal rate, regular rhythm, normal S1, S2, no murmurs, rubs, clicks or gallops  Abdomen - soft, nontender, nondistended, no masses or organomegaly  Rectal - deferred to PCP.  Neurological - alert, oriented, normal speech, no focal findings or movement disorder noted, screening mental status exam normal, neck supple without rigidity, cranial nerves II through XII intact, motor and sensory grossly normal bilaterally, normal muscle tone, mild tremors, strength 5/5  Musculoskeletal - no joint tenderness, deformity or swelling  Extremities - peripheral pulses normal, no pedal edema, no clubbing or cyanosis  Skin - normal    Clinical Diagnosis:    Axis I: Alcohol dependence with uncomplicated intoxication and Anxiety disorder, unspecified anxiety disorder type   Axis KG:MWNUU   Axis III: HTN   Axis IV: Other psychosocial or environmental problems   Problems with primary support  group   Axis V:41-50: serious symptoms.: Highest in the last year 65   No significant change        Discharge Medications:     Current Discharge Medication List      START taking these medications    Details   traZODone (DESYREL) 50 MG tablet Take 1 tablet (50 mg total) by mouth nightly as needed for Sleep.  Qty: 15 tablet, Refills: 0    Associated Diagnoses: Abnormal clinical finding         CONTINUE these medications which have NOT CHANGED    Details   amLODIPine (NORVASC) 5 MG tablet 10 mg.         atorvastatin (LIPITOR) 40 MG tablet       hydrochlorothiazide (HYDRODIURIL) 25 MG tablet       losartan (COZAAR) 100 MG tablet       omeprazole (PRILOSEC) 20 MG capsule       COLCRYS 0.6 MG tablet          STOP taking these medications       buPROPion SR (WELLBUTRIN SR) 150 MG 12 hr tablet        DULoxetine (CYMBALTA) 60 MG capsule        furosemide (LASIX) 40 MG tablet                Discharge Instructions:   Follow-up with IOP/Residential in [place] per patient.  SA Counseling  AA/NA  Follow up with PCP      Signed by: Nicholaus Bloom, MD      Time:  35 mins.

## 2014-09-12 NOTE — Progress Notes (Signed)
Counseling Note: Pt attended community group this morning and reported not feeling well. Pt is working on the Step One task and is not ready to present today. Pt shared that he did not have a sponsor, did not make any phone calls, and did not go to a meeting yesterday.  Pt said that he is grateful for being alive.  Counselor later met with the pt, and pt presented as tire and isolative. Pt stated he wasn't feeling well this morning. In the afternoon pt made phone calls for his aftercare. Pt did not attend groups but met with counselor.        Transition Planning Note: Counselor and pt discussed plan for aftercare following discharge is attend the treatment center this weekend in [place]. Pt is scheduled to go to  [facility],[address] [address] [place] [address]. POC is Lisa Roca Clinical Director.

## 2014-09-12 NOTE — Plan of Care (Addendum)
Pt assessed for s/sx of alcohol withdrawal. Pt remains stable, calm and cooperative; VS WNL. N/V improved, poor appetite, is taking in adequate amount of fluids. CIWA=3. Pt actively making phone calls for aftercare and states he is planning to do IOP in [place], [place] where his wife is currently. Will continue to monitor for changes, maintain pt safety.

## 2014-09-12 NOTE — Progress Notes (Signed)
PROGRESS NOTE    Date Time: 09/12/2014 6:39 AM  Patient Name: Sean [name]      Chief Complaint:   Anxiety  Tremors  Shakes  Doing better  Plans on residential program        Assessment:   Alcohol Use Disorder  Alcohol Withdrawal  Hypertension  Alcoholic Liver Disease  THROMBOCYTOPENIA          Plan:   Continue with Detoxification  Serax Protocol  SA Counseling  Discussed with treatment team  Review labs with patients  IOP Triage  Discharge to [facility]             Medications:     Current Facility-Administered Medications   Medication Dose Route Frequency   . amLODIPine  5 mg Oral Daily   . atorvastatin  40 mg Oral QHS   . colchicine  0.6 mg Oral Daily   . folic acid  1 mg Oral Daily   . guaiFENesin  600 mg Oral Q12H SCH   . hydrochlorothiazide  25 mg Oral Daily   . losartan  100 mg Oral Daily   . multivitamin  1 tablet Oral Daily   . pantoprazole  40 mg Oral QAM AC   . thiamine  100 mg Intramuscular Daily   . thiamine  100 mg Oral Daily       Review of Systems:   A comprehensive review of systems was negative except for:   Respiratory: Negative  Cardiovascular: Negative  Gastrointestinal: Negative  Genitourinary: Negative  Musculoskeletal: Negative  Neurological: positive for gait problems, tremors and weakness    Physical Exam:     Filed Vitals:    09/12/14 0554   BP: 120/76   Pulse: 98   Temp: 97.8 F (36.6 C)   Resp: 16   SpO2:        General appearance - oriented to person, place, and time   Mental status -  Anxious mood, guarded  Eyes - pupils equal and reactive, extraocular eye movements intact  Ears - bilateral TM's and external ear canals normal  Nose - no septal perforation  Mouth - mucous membranes moist, pharynx normal without lesions  Neck - supple, no significant adenopathy  Chest - clear to auscultation, no wheezes, rales or rhonchi, symmetric air entry  Heart - normal rate, regular rhythm, normal S1, S2, no murmurs, rubs, clicks or gallops  Abdomen - soft, nontender, nondistended, no  masses or organomegaly  Rectal - deferred to PCP.  Neurological - alert, oriented, normal speech, no focal findings  Musculoskeletal - no joint tenderness, deformity or swelling  Extremities - peripheral pulses normal  Skin - normal      Labs:     Results     Procedure Component Value Units Date/Time    HIV Ag/Ab 4th generation [[id number]] Collected:  09/11/14 0804     HIV Ag/Ab, 4th Generation Non-Reactive Updated:  09/11/14 1036    Hepatitis C (HCV) antibody, Total [[id number]] Collected:  09/11/14 0804    Specimen Information:  Blood Updated:  09/11/14 1035     Hepatitis C, AB Non-Reactive     Hepatitis B (HBV) Surface Antigen [[id number]] Collected:  09/11/14 0804    Specimen Information:  Blood Updated:  09/11/14 1035     Hepatitis B Surface AG Non-Reactive     Hemolysis index [[id number]] Collected:  09/11/14 0804     Hemolysis Index 5 Updated:  09/11/14 1000    Platelet count [[id number]]  (Abnormal) Collected:  09/11/14 0804    Specimen Information:  Blood Updated:  09/11/14 0945     Platelets 74 (L) x10 3/uL     RPR [[id number]] Collected:  09/11/14 0804    Specimen Information:  Blood Updated:  09/11/14 0941              Signed by: Nicholaus Bloom, MD

## 2014-09-12 NOTE — Plan of Care (Signed)
Problem: Safe medical management of withdrawal from (list substances of abuse) AS EVIDENCED BY...  Goal: Compliance with management of withdrawal syndrome  Intervention: Assess withdrawal signs/symptoms according to identified protocol e.g. CIWA/COWS  Pt assessed for s/sx of alcohol withdrawal. Pt a&ox4, ambulating with steady gait. Denies pain. CIWA=6 this AM for Nausea/vomiting, mild anxiety. IM phenergan given for active vomiting @ 0917, pt currently resting comfortably. Pt has not attended groups d/t N/V this am, pt encouraged to participate in therapies this afternoon; pt encouraged to define aftercare plan today. Will continue to monitor for changes, maintain pt safety.

## 2014-09-13 NOTE — Discharge Instructions (Signed)
Alcohol Abuse  Alcoholic drinks are harmful when you have too many of them. Drinking that disrupts your life is called alcohol abuse. Alcohol abuse can hurt your relationships with others. You may lose friends, a spouse, or even your job. You may be abusing alcohol if any of the following are true for you:   Duties at home or with child care suffer because of drinking.   Duties at work or in school suffer because of drinking.   You have missed work or school because of drinking.   You use alcohol while driving or operating machinery.   You have legal problems such as arrests due to drinking.   You keep drinking even though it causes serious problems in your life.  Alcohol abuse can cause health problems. Too much alcohol can cause disease of the liver and pancreas. It can damage your brain and your heart. It raises your risk of cancer. It can lead to sexual problems and make it hard to conceive children. Alcohol abuse in pregnancy can hurt the baby. Alcohol affects how you think and respond. You are more likely to die in an accident or fire if you have been drinking.  Home Care   Admit you have a problem with alcohol.   Ask for help from your healthcare provider as well as trusted family members or close friends.   Get help from people trained in dealing with alcohol abuse. This may be individual counseling or group therapy, or it may be a supervised alcohol treatment program.   Join a self-help group for alcohol abuse such as Alcoholics Anonymous (AA).   Avoid people who abuse alcohol or tempt you to drink.  Follow Up  as advised by the doctor or our staff. Contact these groups to get help:   Alcoholics Anonymous (AA): Go to SalaryStart.tn or check the phone book for meetings near you.   National Alcohol and Substance Abuse Information Center [facility]): 367-723-9899 www.addictioncareoptions.com   [employer] on Alcoholism and Drug Dependence (NCADD): 800-NCA-CALL 825-423-9985) www.ncadd.org   Al-Anon:  888-4AL-ANON (478-2956) www.al-anon.org  Get Prompt Medical Attention  if you have:   Confusion   Hallucinations (seeing, hearing, or feeling things that aren't there)   Extreme drowsiness or inability to awaken   Increasing upper abdominal pain   Repeated vomiting, vomiting blood, or black or tarry stools   Severe shakiness or fast heart rate   Seizure (convulsion)   2000-2015 [employer] [address], [place], [place] [address]. All rights reserved. This information is not intended as a substitute for professional medical care. Always follow your healthcare professional's instructions.      Trazodone Hydrochloride Oral tablet  What is this medicine?  TRAZODONE (TRAZ oh done) is used to treat depression.  This medicine may be used for other purposes; ask your health care provider or pharmacist if you have questions.  What should I tell my health care provider before I take this medicine?  They need to know if you have any of these conditions:   attempted suicide or thinking about it   bipolar disorder   bleeding problems   glaucoma   heart disease, or previous heart attack   irregular heart beat   kidney or liver disease   low levels of sodium in the blood   an unusual or allergic reaction to trazodone, other medicines, foods, dyes or preservatives   pregnant or trying to get pregnant   breast-feeding  How should I use this medicine?  Take this medicine by mouth with a glass of water. Follow the directions on the prescription label. Take this medicine shortly after a meal or a light snack. Take your medicine at regular intervals. Do not take your medicine more often than directed. Do not stop taking this medicine suddenly except upon the advice of your doctor. Stopping this medicine too quickly may cause serious side effects or your condition may worsen.  A special MedGuide will be given to you by the pharmacist with each prescription and refill. Be sure to read this information  carefully each time.  Talk to your pediatrician regarding the use of this medicine in children. Special care may be needed.  Overdosage: If you think you have taken too much of this medicine contact a poison control center or emergency room at once.  NOTE: This medicine is only for you. Do not share this medicine with others.  What if I miss a dose?  If you miss a dose, take it as soon as you can. If it is almost time for your next dose, take only that dose. Do not take double or extra doses.  What may interact with this medicine?  Do not take this medicine with any of the following medications:   cisapride   dofetilide   dronedarone   fluconazole   linezolid   MAOIs like Carbex, Eldepryl, Marplan, Nardil, and Parnate   mesoridazine   methylene blue (injected into a vein)   pimozide   posaconazole   saquinavir   thioridazine   ziprasidone  This medicine may also interact with the following medications:   alcohol   antiviral medicines for HIV or AIDS   aspirin and aspirin-like medicines   barbiturates such as phenobarbital   certain medicines that treat or prevent blood clots like warfarin, enoxaparin, and dalteparin   certain medicines for blood pressure, heart disease, irregular heart beat   certain medicines for depression, anxiety, or psychotic disturbances   certain medicines for migraine headache like almotriptan, eletriptan, frovatriptan, naratriptan, rizatriptan, sumatriptan, zolmitriptan   certain medicines for sleep   digoxin   fentanyl   ketoconazole   lithium   medicines for seizures like carbamazepine and phenytoin   NSAIDS, medicines for pain and inflammation, like ibuprofen or naproxen   rasagiline   supplements like St. Christofer's wort, kava kava, valerian   tramadol   tryptophan  This list may not describe all possible interactions. Give your health care provider a list of all the medicines, herbs, non-prescription drugs, or dietary supplements you use. Also tell them if  you smoke, drink alcohol, or use illegal drugs. Some items may interact with your medicine.  What should I watch for while using this medicine?  Tell your doctor if your symptoms do not get better or if they get worse. Visit your doctor or health care professional for regular checks on your progress. Because it may take several weeks to see the full effects of this medicine, it is important to continue your treatment as prescribed by your doctor.  Patients and their families should watch out for new or worsening thoughts of suicide or depression. Also watch out for sudden changes in feelings such as feeling anxious, agitated, panicky, irritable, hostile, aggressive, impulsive, severely restless, overly excited and hyperactive, or not being able to sleep. If this happens, especially at the beginning of treatment or after a change in dose, call your health care professional.  Bonita Quin may get drowsy or dizzy. Do not drive, use machinery,  or do anything that needs mental alertness until you know how this medicine affects you. Do not stand or sit up quickly, especially if you are an older patient. This reduces the risk of dizzy or fainting spells. Alcohol may interfere with the effect of this medicine. Avoid alcoholic drinks.  This medicine may cause dry eyes and blurred vision. If you wear contact lenses you may feel some discomfort. Lubricating drops may help. See your eye doctor if the problem does not go away or is severe.  Your mouth may get dry. Chewing sugarless gum, sucking hard candy and drinking plenty of water may help. Contact your doctor if the problem does not go away or is severe.  What side effects may I notice from receiving this medicine?  Side effects that you should report to your doctor or health care professional as soon as possible:   allergic reactions like skin rash, itching or hives, swelling of the face, lips, or tongue   fast, irregular heartbeat   feeling faint or lightheaded, falls   painful  erections or other sexual dysfunction   suicidal thoughts or other mood changes   trembling  Side effects that usually do not require medical attention (report to your doctor or health care professional if they continue or are bothersome):   constipation   headache   muscle aches or pains   nausea, vomiting   unusually weak or tired  This list may not describe all possible side effects. Call your doctor for medical advice about side effects. You may report side effects to FDA at 1-800-FDA-1088.  Where should I keep my medicine?  Keep out of the reach of children.  Store at room temperature between 15 and 30 degrees C (59 to 86 degrees F). Protect from light. Keep container tightly closed. Throw away any unused medicine after the expiration date.  NOTE:This sheet is a summary. It may not cover all possible information. If you have questions about this medicine, talk to your doctor, pharmacist, or health care provider. Copyright 2015 [employer]        Discharge Instructions: Taking Blood Pressure Medications  You have been diagnosed with high blood pressure (hypertension). Diet and exercise help control high blood pressure. Many people also need the help of one or more medications. Here are the main types of blood pressure drugs and how they work.  Diuretics  Diuretics are sometimes called "water pills" because they work in the kidney and flush excess water and sodium (salt) from the body.  Beta-blockers  Beta-blockers reduce nerve impulses to the heart and blood vessels. This makes the heart beat slower and with less force. Your blood pressure drops, and your heart does not have to work as hard.  ACE inhibitors  ACE inhibitors cause the vessels to relax. This helps the blood flow better and lowers blood pressure.  Angiotensin antagonists  Angiotensin antagonists shield blood vessels from a hormone that causes the blood vessels to get stiff and narrow. Your vessels become wider, and your blood pressure goes  down.  Calcium channel blockers (CCBs)  CCBs keep calcium from entering the muscle cells of the heart and blood vessels. This causes blood vessels to relax, and your blood pressure goes down.  Alpha-blockers  Alpha-blockers reduce nerve impulses to blood vessels. This allows blood to pass easily, causing blood pressure to go down.  Alpha-beta blockers  Alpha-beta blockers work the same way as alpha-blockers but also slow your heartbeat. As a result, less blood is  pumped through your blood vessels and your blood pressure goes down.  Vasodilators  Vasodilators directly open blood vessels by relaxing the muscle in the vessel walls, causing blood pressure to go down.   [address] [employer] [address], [place], [place] [address]. All rights reserved. This information is not intended as a substitute for professional medical care. Always follow your healthcare professional's instructions.

## 2014-09-13 NOTE — Discharge Summary -  Counselor (Signed)
[facility]   Discharge Summary    Patient Name: Sean [name], Sean [name]  Date of Birth: 09/22/1946  Medical Record Number: [id number]  Level of Care: Inpatient  Admission Date: 09/10/2014.  Unit Discharge Date: 09/13/2014   Reason For Discharge: Completed    Initial Justification for Treatment:   Stein Windhorst is a 68 y.o. married Caucasian male presenting with diagnosis of Axis I: Alcohol dependence with unspecified alcohol-induced disorder and Anxiety disorder, unspecified anxiety disorder type and treatment. This was patient's third treatment episode at [facility] and fourth treatment episode overall. Patient reports the Presenting Problem: Alcohol Use Disorder Patient Stated Goal: My goal is sobriety, not a dry drunk, real sobriety. I don't want to be a miserable person.    Detailed Drug Use History  Any current alcohol and/or drug use?: Yes  Reason for Alcohol or Drug Use: Pleasure, relaxation, I like it.   Alcohol Use: In Past Two Weeks  Cans of beer per week: 0  Glasses of wine per week: 56  Shots of liquor per week: 0  Drinks containing 1/2 oz alcohol per week: 0  Total Ounces of Alcohol Per Week: 33.6  Age first used alcohol: : 75  Max used in a day: 4 bottles of wine  Pattern of use:: 2 bottles of wine a day  Last use:: 2 AM today  Longest Abstinence: : 1 to 2 years  Tranquilizers/Benzodiazepines:: N/A  Tranquilizers/Benzodiazepines #2:: N/A  Tranquilizers/Benzodiazepines #3: N/A  Heroin Use: N/A  Rx Opiates #1: : N/A  Rx Opiates #2: : N/A  Rx Opiates #3: : N/A  Opiates Use #4:: N/A  Opiates Use #5:: N/A  Opiates Use #6:: N/A  Non-Rx Methadone Use: N/A  Amphetamine Use: N/A  Marijuana Use: N/A  Cocaine Use: N/A  Hallucinogens Use: N/A  Inhalants Use: N/A  PCP Use: N/A  Nicotine Use: N/A  Barbiturates Use: N/A  Other Substance Abuse: N/A  Other Substance Abuse #2:: N/A  Other Substance Abuse #3:: N/A  Caffeine use:: diet coke 6 to 8 a day  Age first used:: 22  Years of use:: 47  Max used in a day: : 8  Pattern of  use:: daily  Last use:: today  Longest Abstinence: : 8 hours when I sleep.     Current Withdrawal Symptoms: Anxiety, Irritability, Nausea/vomiting, Tremors, Sweats, Fatigue  Past Withdrawal Symptoms: Anxiety, Irritability, Nausea/vomiting, Abdominal pain, Diarrhea, Hypersomnia, Insomnia, Sweats, Chills, Tremors, Yawning, Fatigue, Headache, Joint/Bodyaches  History of Blackouts?: Yes  History of Withdrawal Seizures?: No    Summary of Significant Findings:  Upon discharge, patient does not report SI/HI. Patient does not report past SI/HI. Patient denies history of self-harm or injurious behavior. Patient denies family history of suicide. Patient denies history of mental health problems. No issues of physical, emotional, or sexual abuse are reported.     Patient reports current living situation is single family home and that he lives with wife. Patient denies this is a sober environment. Patient reports he does not currently have a sponsor. Patient reports he participates in reading,outdoors,gardening and classic corvette for leisure activities. Patient describes current social relationships as its brought a lot of concern in my friends lives. Patient denies legal history. 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 Patient is Retired. Patient does not report job consequences as a result of his substance abuse. Patient does report financial problems currently I have spent a lot of money even thought I am retired. 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 Patient reports last time he saw his medical provider was one year ago for PCP and three H&P in the past months.      Precipitating Event:   Patient reports precipitating event for coming to [facility] was "It was my friends who were worried about me. They came in and would not leave until I came with them."  Patient reports perception of addiction as "it is a  progressive lethal disease that can be treated but not cured."Patient reports strengths as intelligent,hardworking,I don't give up easily.     Patient Stated Goal: My goal is sobriety, not a dry drunk, real sobriety. I don't want to be a miserable person.    Discharge Medications:    Current Discharge Medication List       START taking these medications     Details    traZODone (DESYREL) 50 MG tablet  Take 1 tablet (50 mg total) by mouth nightly as needed for Sleep.  Qty: 15 tablet, Refills: 0     Associated Diagnoses: Abnormal clinical finding          CONTINUE these medications which have NOT CHANGED     Details    amLODIPine (NORVASC) 5 MG tablet  10 mg.       atorvastatin (LIPITOR) 40 MG tablet        hydrochlorothiazide (HYDRODIURIL) 25 MG tablet        losartan (COZAAR) 100 MG tablet        omeprazole (PRILOSEC) 20 MG capsule        COLCRYS 0.6 MG tablet           STOP taking these medications        buPROPion SR (WELLBUTRIN SR) 150 MG 12 hr tablet         DULoxetine (CYMBALTA) 60 MG capsule         furosemide (LASIX) 40 MG tablet             Summary of Services Provided:   Pt. was detoxified with Serax protocol. Ptwas individually assessed and treatment plans were developed. Pt. had been offered education on the disease process of addition, relapse behaviors, self-defeating learned behaviors, defense mechanisms and post acute withdrawal symptoms. Pt. was directed to attend all 12-step meetings and educational groups/lectures. Patient began inpatient level of care on 09/10/2014  6:32 PM. Patient was referred to this level of care by self.  Patient made the following progress towards meeting treatment goals: met with counselor and worked on aftercare.  Patient did not attend groups. Pt aftercare plan is attend the treatment center this weekend in [place]. Pt is scheduled to go to [facility],[address] [address] [place] [address]. POC is [name] ,[place], LCAS, Astronomer. Patient did not  involve his family in treatment.      Patient presented in the a contemplative stage of change as evidenced by pt willingness to go to the next level of care and stop drinking.     Final Five Axis Diagnosis:   Axis I: Alcohol dependence with unspecified alcohol-induced disorder and Anxiety disorder, unspecified anxiety disorder type   Axis UJ:[id number]   Axis III: HTN, Gout, GERD, Hyperlipidemia   Axis IV: Other psychosocial or environmental problems   Problems with primary support group   Axis V:41-50: serious symptoms.: Highest in the last year 65.    Condition at discharge  Pt was medically stabilized at time of discharge  Continuing Care Plan:   Continue attending 12-step meetings.  Continue to take all medications, as directed.

## 2014-09-13 NOTE — Plan of Care (Signed)
Reeder has been assessed per CIWA protocol for ETOH withdrawal. He c/o acute anxiety and agitation r/t recent telephone conversation he had with his wife. Per patient, his wife is upset with him for his alcohol relapse and other things he did not wish to elaborate on. This morning patient was hypertensive with BP of 160/104, HR 109. Pt attributed this to the conversation with his wife. Was medicated with scheduled morning meds and PRN Serax 15mg  @ 0906. He was again medicated with second dose of Serax 15mg  @ 1322 due to continued anxiety r/t discharge. Pt reviewed and signed nursing tx plan. Was discharged to home @ 1430 per MD order. Discharge medications and instructions reviewed with patient. Rx for Trazodone given. Pt plans to f/u with [facility] in [place], [place] for possible IOP assessment. He plans to drive down either tonight or tomorrow. In good spirits at discharge. Denied SI/HI. Home medications and cell phone returned to patient. Left ambulatory with his belongings along with friend who came to pick him up.

## 2014-09-13 NOTE — Plan of Care (Signed)
[facility] Nursing Discharge and Collaborative Plan       Date:  09/13/2014  Time: 9:39 AM  Patient Name:  Sean [name]  68 y.o.   1946-09-03      Date of Admission:  09/10/14  Discharge Physician: Dr. Richardson Chiquito     Labs:     Lab Results   Component Value Date    ALT 46 09/12/2014    WBC 5.29 09/10/2014    WBC 5.7 05/03/2014    RBC 4.81 09/10/2014    RBC 4.57 05/03/2014    HGB 14.2 09/10/2014    HGB 13.9 05/03/2014    HCT 42.5 09/10/2014    MCV 88.4 09/10/2014    PLT 87* 09/12/2014    PLT 115* 05/03/2014    AST 33 09/12/2014    GGT 76* 09/12/2014    ALB 3.1* 09/12/2014    ALB 3.6 05/03/2014    MG 2.2 09/10/2014    MG 1.7 04/12/2013    RPR Non Reactive 09/11/2014       Vaccination Results:    There is no immunization history on file for this patient.  Pneumococcal:  Not Indicated  Influenza:  Refused  TB Test:  Not Tested      Discharged Medication(s): ** Include reasons for prescribed**       [name], [name]   Home Medication Instructions ZOX:[id number]    Printed on:09/13/14 0940   Medication Information                      amLODIPine (NORVASC) 5 MG tablet  10 mg for hypertension.             atorvastatin (LIPITOR) 40 MG tablet   For hypertension.           COLCRYS 0.6 MG tablet for gout.           hydrochlorothiazide (HYDRODIURIL) 25 MG tablet for hypertension.             losartan (COZAAR) 100 MG tablet for hypertension.             omeprazole (PRILOSEC) 20 MG capsule for acid reflux.           traZODone (DESYREL) 50 MG tablet  Take 1 tablet (50 mg total) by mouth nightly as needed for Sleep.                   Disposition/aftercare/Referral Plan:       Follow-up plan for sobriety was discussed with the patient.    Discharge Instructions:     Discharge instructions given to [name].    Questions that may arise between hospital discharge and your first follow-up appointment should be directed to [facility]patient [facility] [phone].    In event that you have a seizure upon discharge please call 911 and/or  proceed to the nearest hospital.    Discharge Plan:     Referrals:  Comments   Continued Care Program  Phone #: [phone]  yes [facility] in [place], [place]  Assessment with [name], Asst. Clinical Director  Date: 09/13/14       Therapist/Psychiatrist  Phone #:  no    Medical Appointment  Phone #:  no        TOPIC INSTRUCTION METHOD OUTCOME COMMENTS   DIET General  Discussion Learned Eat a well balanced diet.   SAFETY  Potential Seizures  Discussion Learned If you have any problems call 911 or go to an emergency room.  Patient Signature_____________________________ date 09/13/2014  9:40 AM    Nurse Signature________________________________date 09/13/2014 9:40 AM    [name] RN  09/13/2014  9:40 AM

## 2014-09-13 NOTE — Plan of Care (Signed)
Sean [name] was re-evaluated for the night per CIWA protocol. CIWA = 2 and 2, vital signs were stable, no medication given at this time since pt symptoms were minimal. He has been sleeping throughout with even and unlabored respirations as noted on hourly rounding. RN will continue to monitor and follow current plan of care.

## 2014-09-13 NOTE — Plan of Care (Signed)
Problem: Health Promotion  Goal: Knowledge - disease process  Extent of understanding conveyed about a specific disease process.   Intervention: Risk identification    Sean [name] will verbalize an understanding of hypertension and its effects on the overall health. Interventions- 1) Nursing will provide the patient with basic teaching a) Normal Blood pressure limits b) dietary considerations to reduce sodium intake. 2) staff will provide patient with medication teaching for the current medications they are taking to treat their hypertension.

## 2014-09-13 NOTE — Progress Notes (Signed)
Transitional Planning Note:  Sean [name] is a 68 y.o. married Caucasian male presenting with diagnosis of Axis I: Alcohol dependence with unspecified alcohol-induced disorder and Anxiety disorder, unspecified anxiety disorder type and treatment. This was patient's third treatment episode at [facility] and fourth treatment episode overall. Patient reports the Presenting Problem: Alcohol Use Disorder Patient Stated Goal: My goal is sobriety, not a dry drunk, real sobriety. I don't want to be a miserable person.  Summary of Significant Findings:  Upon discharge, patient does not report SI/HI. Patient does not report past SI/HI. Patient denies history of self-harm or injurious behavior. Patient denies family history of suicide. Patient denies history of mental health problems. No issues of physical, emotional, or sexual abuse are reported.     Patient reports current living situation is single family home and that he lives with wife. Patient denies this is a sober environment. Patient reports he does not currently have a sponsor. Patient reports he participates in reading,outdoors,gardening and classic corvette for leisure activities. Patient describes current social relationships as its brought a lot of concern in my friends lives. Patient denies legal history. 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 Patient is Retired. Patient does not report job consequences as a result of his substance abuse. Patient does report financial problems currently I have spent a lot of money even thought I am retired. 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 Patient reports last time he saw his medical provider was one year ago for PCP and three H&P in the past months.    Patient reports precipitating event for coming to [facility] was "It was my friends who were worried about me. They came in and would not leave until I came with  them."  Patient reports perception of addiction as "it is a progressive lethal disease that can be treated but not cured."Patient reports strengths as intelligent,hardworking,I don't give up easily.   Patient Stated Goal: My goal is sobriety, not a dry drunk, real sobriety. I don't want to be a miserable person.    Discharge Medications:     Current Discharge Medication List        START taking these medications      Details     traZODone (DESYREL) 50 MG tablet   Take 1 tablet (50 mg total) by mouth nightly as needed for Sleep.  Qty: 15 tablet, Refills: 0      Associated Diagnoses: Abnormal clinical finding           CONTINUE these medications which have NOT CHANGED      Details     amLODIPine (NORVASC) 5 MG tablet   10 mg.        atorvastatin (LIPITOR) 40 MG tablet         hydrochlorothiazide (HYDRODIURIL) 25 MG tablet         losartan (COZAAR) 100 MG tablet         omeprazole (PRILOSEC) 20 MG capsule         COLCRYS 0.6 MG tablet            STOP taking these medications         buPROPion SR (WELLBUTRIN SR) 150 MG 12 hr tablet          DULoxetine (CYMBALTA) 60 MG capsule          furosemide (LASIX) 40 MG tablet  Summary of Services Provided:   Pt. was detoxified with [facility] protocol. Ptwas individually assessed and treatment plans were developed. Pt. had been offered education on the disease process of addition, relapse behaviors, self-defeating learned behaviors, defense mechanisms and post acute withdrawal symptoms. Pt. was directed to attend all 12-step meetings and educational groups/lectures. Patient began inpatient level of care on 09/10/2014 6:32 PM. Patient was referred to this level of care by self. Patient made the following progress towards meeting treatment goals: met with counselor and worked on aftercare. Patient did not attend groups. Pt aftercare plan is attend the treatment center this weekend in [place]. Pt is scheduled to go to [facility],[address] [address] [place] [address].  POC is [name] ,[place], LCAS, Astronomer. Patient did not involve his family in treatment.     Patient presented in the a contemplative stage of change as evidenced by pt willingness to go to the next level of care and stop drinking.     Final Five Axis Diagnosis:   Axis I: Alcohol dependence with unspecified alcohol-induced disorder and Anxiety disorder, unspecified anxiety disorder type   Axis QI:ONGEX   Axis III: HTN, Gout, GERD, Hyperlipidemia   Axis IV: Other psychosocial or environmental problems   Problems with primary support group   Axis V:41-50: serious symptoms.: Highest in the last year 65.      Sean [name] [facility]

## 2014-09-13 NOTE — Plan of Care (Deleted)
v

## 2014-09-15 ENCOUNTER — Observation Stay (HOSPITAL_COMMUNITY): Admission: AD | Admit: 2014-09-15 | Payer: Medicare (Managed Care) | Source: Intra-hospital | Admitting: Psychiatry

## 2014-09-15 ENCOUNTER — Emergency Department (HOSPITAL_COMMUNITY)
Admission: EM | Admit: 2014-09-15 | Discharge: 2014-09-16 | Disposition: A | Discharge status: Other Acute Inpatient Hospital | Payer: Medicare Other | Source: Home / Self Care | Attending: Emergency Medicine | Admitting: Emergency Medicine

## 2014-09-15 ENCOUNTER — Encounter (HOSPITAL_COMMUNITY): Payer: Self-pay | Admitting: Emergency Medicine

## 2014-09-15 DIAGNOSIS — F419 Anxiety disorder, unspecified: Secondary | ICD-10-CM | POA: Diagnosis not present

## 2014-09-15 DIAGNOSIS — F332 Major depressive disorder, recurrent severe without psychotic features: Secondary | ICD-10-CM | POA: Diagnosis not present

## 2014-09-15 DIAGNOSIS — E860 Dehydration: Secondary | ICD-10-CM | POA: Insufficient documentation

## 2014-09-15 DIAGNOSIS — F10930 Alcohol use, unspecified with withdrawal, uncomplicated: Secondary | ICD-10-CM

## 2014-09-15 DIAGNOSIS — Z950 Presence of cardiac pacemaker: Secondary | ICD-10-CM | POA: Insufficient documentation

## 2014-09-15 DIAGNOSIS — I10 Essential (primary) hypertension: Secondary | ICD-10-CM

## 2014-09-15 DIAGNOSIS — F10239 Alcohol dependence with withdrawal, unspecified: Principal | ICD-10-CM | POA: Insufficient documentation

## 2014-09-15 DIAGNOSIS — Z79899 Other long term (current) drug therapy: Secondary | ICD-10-CM | POA: Insufficient documentation

## 2014-09-15 DIAGNOSIS — R251 Tremor, unspecified: Secondary | ICD-10-CM | POA: Insufficient documentation

## 2014-09-15 DIAGNOSIS — F10230 Alcohol dependence with withdrawal, uncomplicated: Secondary | ICD-10-CM | POA: Insufficient documentation

## 2014-09-15 DIAGNOSIS — R000 Tachycardia, unspecified: Secondary | ICD-10-CM | POA: Insufficient documentation

## 2014-09-15 HISTORY — DX: Essential (primary) hypertension: I10

## 2014-09-15 HISTORY — DX: Alcohol dependence, uncomplicated: F10.20

## 2014-09-15 HISTORY — DX: Presence of cardiac pacemaker: Z95.0

## 2014-09-15 LAB — CBC WITH DIFFERENTIAL/PLATELET
Basophils Absolute: 0 10*3/uL (ref 0.0–0.1)
Basophils Relative: 0 % (ref 0–1)
Eosinophils Absolute: 0 10*3/uL (ref 0.0–0.7)
Eosinophils Relative: 1 % (ref 0–5)
HEMATOCRIT: 39.4 % (ref 39.0–52.0)
HEMOGLOBIN: 13.2 g/dL (ref 13.0–17.0)
LYMPHS ABS: 0.9 10*3/uL (ref 0.7–4.0)
LYMPHS PCT: 21 % (ref 12–46)
MCH: 29 pg (ref 26.0–34.0)
MCHC: 33.5 g/dL (ref 30.0–36.0)
MCV: 86.6 fL (ref 78.0–100.0)
MONOS PCT: 12 % (ref 3–12)
Monocytes Absolute: 0.5 10*3/uL (ref 0.1–1.0)
Neutro Abs: 2.8 10*3/uL (ref 1.7–7.7)
Neutrophils Relative %: 66 % (ref 43–77)
Platelets: 139 10*3/uL — ABNORMAL LOW (ref 150–400)
RBC: 4.55 MIL/uL (ref 4.22–5.81)
RDW: 16.4 % — ABNORMAL HIGH (ref 11.5–15.5)
WBC: 4.2 10*3/uL (ref 4.0–10.5)

## 2014-09-15 LAB — COMPREHENSIVE METABOLIC PANEL
ALT: 46 U/L (ref 0–53)
ANION GAP: 19 — AB (ref 5–15)
AST: 38 U/L — ABNORMAL HIGH (ref 0–37)
Albumin: 3.4 g/dL — ABNORMAL LOW (ref 3.5–5.2)
Alkaline Phosphatase: 80 U/L (ref 39–117)
BUN: 16 mg/dL (ref 6–23)
CALCIUM: 9.2 mg/dL (ref 8.4–10.5)
CO2: 24 meq/L (ref 19–32)
CREATININE: 1.09 mg/dL (ref 0.50–1.35)
Chloride: 98 mEq/L (ref 96–112)
GFR calc Af Amer: 79 mL/min — ABNORMAL LOW (ref >90)
GFR, EST NON AFRICAN AMERICAN: 68 mL/min — AB (ref >90)
GLUCOSE: 122 mg/dL — AB (ref 70–99)
Potassium: 3.3 mEq/L — ABNORMAL LOW (ref 3.7–5.3)
Sodium: 141 mEq/L (ref 137–147)
Total Bilirubin: 0.3 mg/dL (ref 0.3–1.2)
Total Protein: 7.1 g/dL (ref 6.0–8.3)

## 2014-09-15 LAB — RAPID URINE DRUG SCREEN, HOSP PERFORMED
AMPHETAMINES: NOT DETECTED
Barbiturates: NOT DETECTED
Benzodiazepines: NOT DETECTED
Cocaine: NOT DETECTED
Opiates: NOT DETECTED
Tetrahydrocannabinol: NOT DETECTED

## 2014-09-15 LAB — LIPASE, BLOOD: Lipase: 40 U/L (ref 11–59)

## 2014-09-15 LAB — ETHANOL: ALCOHOL ETHYL (B): 106 mg/dL — AB (ref 0–11)

## 2014-09-15 MED ORDER — ONDANSETRON HCL 4 MG/2ML IJ SOLN
4.0000 mg | Freq: Once | INTRAMUSCULAR | Status: AC
  Administered 2014-09-15: 4 mg via INTRAVENOUS
  Filled 2014-09-15: qty 2

## 2014-09-15 MED ORDER — HYDROCHLOROTHIAZIDE 25 MG PO TABS
25.0000 mg | ORAL_TABLET | Freq: Every day | ORAL | Status: DC
  Administered 2014-09-15: 25 mg via ORAL
  Filled 2014-09-15 (×2): qty 1

## 2014-09-15 MED ORDER — DULOXETINE HCL 60 MG PO CPEP
120.0000 mg | ORAL_CAPSULE | Freq: Every day | ORAL | Status: DC
  Administered 2014-09-15: 120 mg via ORAL
  Filled 2014-09-15 (×2): qty 2

## 2014-09-15 MED ORDER — LOSARTAN POTASSIUM 50 MG PO TABS
100.0000 mg | ORAL_TABLET | Freq: Every day | ORAL | Status: DC
  Administered 2014-09-15: 100 mg via ORAL
  Filled 2014-09-15 (×2): qty 2

## 2014-09-15 MED ORDER — LORAZEPAM 2 MG/ML IJ SOLN
2.0000 mg | Freq: Once | INTRAMUSCULAR | Status: AC
  Administered 2014-09-16: 2 mg via INTRAVENOUS
  Filled 2014-09-15: qty 1

## 2014-09-15 MED ORDER — FUROSEMIDE 40 MG PO TABS
40.0000 mg | ORAL_TABLET | Freq: Every day | ORAL | Status: DC
  Administered 2014-09-15: 40 mg via ORAL
  Filled 2014-09-15: qty 1

## 2014-09-15 MED ORDER — BUPROPION HCL ER (SR) 150 MG PO TB12
150.0000 mg | ORAL_TABLET | Freq: Two times a day (BID) | ORAL | Status: DC
  Administered 2014-09-15: 150 mg via ORAL
  Filled 2014-09-15 (×3): qty 1

## 2014-09-15 MED ORDER — SODIUM CHLORIDE 0.9 % IV BOLUS (SEPSIS)
2000.0000 mL | Freq: Once | INTRAVENOUS | Status: AC
  Administered 2014-09-15: 2000 mL via INTRAVENOUS

## 2014-09-15 MED ORDER — ACETAMINOPHEN 325 MG PO TABS: 650.0000 mg | ORAL_TABLET | ORAL | Status: DC | PRN

## 2014-09-15 MED ORDER — POTASSIUM CHLORIDE CRYS ER 20 MEQ PO TBCR
40.0000 meq | EXTENDED_RELEASE_TABLET | Freq: Once | ORAL | Status: AC
  Administered 2014-09-15: 40 meq via ORAL
  Filled 2014-09-15: qty 2

## 2014-09-15 MED ORDER — LORAZEPAM 1 MG PO TABS
0.0000 mg | ORAL_TABLET | Freq: Four times a day (QID) | ORAL | Status: DC
  Administered 2014-09-16: 1 mg via ORAL
  Filled 2014-09-15 (×2): qty 1

## 2014-09-15 MED ORDER — LORAZEPAM 2 MG/ML IJ SOLN
1.0000 mg | Freq: Once | INTRAMUSCULAR | Status: AC
  Administered 2014-09-15: 1 mg via INTRAVENOUS
  Filled 2014-09-15: qty 1

## 2014-09-15 MED ORDER — SODIUM CHLORIDE 0.9 % IV BOLUS (SEPSIS)
1000.0000 mL | Freq: Once | INTRAVENOUS | Status: AC
  Administered 2014-09-16: 1000 mL via INTRAVENOUS

## 2014-09-15 MED ORDER — IBUPROFEN 200 MG PO TABS: 600.0000 mg | ORAL_TABLET | Freq: Three times a day (TID) | ORAL | Status: DC | PRN

## 2014-09-15 MED ORDER — LORAZEPAM 1 MG PO TABS
0.0000 mg | ORAL_TABLET | Freq: Two times a day (BID) | ORAL | Status: DC
  Administered 2014-09-15: 1 mg via ORAL

## 2014-09-15 MED ORDER — PANTOPRAZOLE SODIUM 40 MG PO TBEC
40.0000 mg | DELAYED_RELEASE_TABLET | Freq: Every day | ORAL | Status: DC
  Administered 2014-09-15: 40 mg via ORAL
  Filled 2014-09-15: qty 1

## 2014-09-15 NOTE — ED Provider Notes (Addendum)
CSN: [id number]     Arrival date & time 09/15/14  1936 History   First MD Initiated Contact with Patient 09/15/14 1953     Chief Complaint  Patient presents with  . Alcohol Problem     (Consider location/radiation/quality/duration/timing/severity/associated sxs/prior Treatment) The history is provided by the patient.  Manuel Escobar is a 68 y.o. male hx of HTN, alcoholic here with nausea, tremors. He drinks alcohol daily. He drinks about 2-3 bottles of wine a day for the last 4-5 years. [facility]. However started drinking for the last 3 weeks and last drink was yesterday. Denies any other drug use and denied smoking. Has previous withdrawals in the past but denies any headache or hallucinations or withdrawal seizures. 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    Past Medical History  Diagnosis Date  . Hypertension   . Alcoholism   . History of pacemaker    Past Surgical History  Procedure Laterality Date  . Prostatectomy     No family history on file. History  Substance Use Topics  . Smoking status: Never Smoker   . Smokeless tobacco: Not on file  . Alcohol Use: Yes     Comment: 2-3 bottles of wine daily    Review of Systems  Neurological: Positive for tremors.  All other systems reviewed and are negative.     Allergies  Review of patient's allergies indicates no known allergies.  Home Medications   Prior to Admission medications   Medication Sig Start Date End Date Taking? Authorizing Provider  buPROPion (WELLBUTRIN SR) 150 MG 12 hr tablet Take 150 mg by mouth 2 (two) times daily.   Yes Historical Provider, MD  DULoxetine (CYMBALTA) 60 MG capsule Take 120 mg by mouth daily.   Yes Historical Provider, MD  furosemide (LASIX) 40 MG tablet Take 40 mg by mouth daily.   Yes Historical Provider, MD  hydrochlorothiazide (HYDRODIURIL) 25 MG tablet Take 25 mg by mouth daily.   Yes Historical Provider, MD  losartan (COZAAR) 100 MG tablet  Take 100 mg by mouth daily.   Yes Historical Provider, MD  omeprazole (PRILOSEC) 20 MG capsule Take 20 mg by mouth daily as needed (acid reflux).   Yes Historical Provider, MD  tolnaftate (TINACTIN) 1 % powder Apply 1 application topically 2 (two) times daily as needed for itching.   Yes Historical Provider, MD   BP 139/90 mmHg  Pulse 93  Temp(Src) 99.3 F (37.4 C) (Oral)  Resp 20  Ht 5\' 10"  (1.778 m)  Wt 210 lb (95.255 kg)  BMI 30.13 kg/m2  SpO2 98% Physical Exam  Constitutional: He is oriented to person, place, and time.  Dehydrated   HENT:  Head: Normocephalic.  MM dry   Eyes: Conjunctivae are normal. Pupils are equal, round, and reactive to light.  Neck: Normal range of motion. Neck supple.  Cardiovascular: Regular rhythm and normal heart sounds.   Mildly tachy   Pulmonary/Chest: Effort normal and breath sounds normal. No respiratory distress. He has no wheezes. He has no rales.  Abdominal: Soft. Bowel sounds are normal. He exhibits no distension. There is no tenderness. There is no rebound.  Musculoskeletal: Normal range of motion. He exhibits no edema or tenderness.  Neurological: He is alert and oriented to person, place, and time.  Mild tremors, nl strength throughout   Skin: Skin is warm and dry.  Psychiatric: He has a normal mood and affect. His behavior is normal.  Judgment and thought content normal.  Nursing note and vitals reviewed.   ED Course  Procedures (including critical care time) Labs Review Labs Reviewed  CBC WITH DIFFERENTIAL - Abnormal; Notable for the following:    RDW 16.4 (*)    Platelets 139 (*)    All other components within normal limits  COMPREHENSIVE METABOLIC PANEL - Abnormal; Notable for the following:    Potassium 3.3 (*)    Glucose, Bld 122 (*)    Albumin 3.4 (*)    AST 38 (*)    GFR calc non Af Amer 68 (*)    GFR calc Af Amer 79 (*)    Anion gap 19 (*)    All other components within normal limits  ETHANOL - Abnormal; Notable for  the following:    Alcohol, Ethyl (B) 106 (*)    All other components within normal limits  LIPASE, BLOOD  URINE RAPID DRUG SCREEN (HOSP PERFORMED)    Imaging Review No results found.   EKG Interpretation None      MDM   Final diagnoses:  None   Manuel Escobar is a 68 y.o. male here with mild alcohol withdrawal, wants detox. Will hydrate, check labs and ETOH. Will give ativan and start on CIWA protocol.   10:02 PM ETOH 100. AG 19, likely from dehydration. I doubt toxic alcohol. Medically cleared, TTS to evaluate for detox.   10:56 PM TTS saw patient and will transfer to [facility] for detox.    Wandra Arthurs, MD 09/15/14 2203  Wandra Arthurs, MD 09/15/14 [phone]

## 2014-09-15 NOTE — BH Assessment (Signed)
Tele Assessment Note   Manuel Escobar is an 68 y.o. male presenting to [facility] requesting help with his alcoholism. Pt reported that he has not had a drink in approximately 24 hrs and he need help with his withdrawals. Pt reported that his drinking has increased during the past 4-5 years. Pt denies SI and did not report any previous suicide attempts. Pt did not report any psychiatric hospitalizations but shared that he has completed detox multiple times in the past. Pt shared that he recently completed detox at [facility] and was able to maintain his sobriety for 2-3 weeks after treatment. Pt reported that he is dealing with some stressors and shared that he recently sold his dream home, retired and loss a friend that he met at [employer].  Pt did not report any issues with his sleep or appetite. Pt denies HI and AVH at this time. Pt denied having access to weapons and firearms. Pt did not report any pending criminal charges but reported that he has a court date October 2077fr violating a restraining order and missing his court date. Pt did not report any illicit substance abuse at this time. PT did not report any physical, sexual or emotional abuse.  Pt is alert and oriented x3. Pt is calm and cooperative at this time. Pt maintained good eye contact and his speech was normal. Pt mood is pleasant and his affect is congruent with his mood. Pt thought process is coherent and relevant. 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 Pt shared that he has a good support system which includes his wife, aunt and uncle.     Axis I: Alcohol intoxication, with moderate or severe use disorder; Alcohol withdrawal  Past Medical History:  Past Medical History  Diagnosis Date  . Hypertension   . Alcoholism   . History of pacemaker     Past Surgical History  Procedure Laterality Date  . Prostatectomy      Family History: No family history on file.  Social History:  reports that  he has never smoked. He does not have any smokeless tobacco history on file. He reports that he drinks alcohol. He reports that he does not use illicit drugs.  Additional Social History:  Alcohol / Drug Use Pain Medications: denies abuse  Prescriptions: denies abuse  Over the Counter: denies abuse  History of alcohol / drug use?: Yes Withdrawal Symptoms: Agitation, Tremors, Patient aware of relationship between substance abuse and physical/medical complications Substance #1 Name of Substance 1: Alcohol  1 - Age of First Use: 29 1 - Amount (size/oz): "2-3 bottles of wine"  1 - Frequency: daily  1 - Duration: 4-5 years  1 - Last Use / Amount: 09-14-14 "1 1/2 bottles"   CIWA: CIWA-Ar BP: 139/90 mmHg Pulse Rate: 93 Nausea and Vomiting: constant nausea, frequent dry heaves and vomiting Tactile Disturbances: none Tremor: no tremor Auditory Disturbances: not present Paroxysmal Sweats: no sweat visible Visual Disturbances: not present Anxiety: mildly anxious Headache, Fullness in Head: none present Agitation: normal activity Orientation and Clouding of Sensorium: oriented and can do serial additions CIWA-Ar Total: 8 COWS:    PATIENT STRENGTHS: (choose at least two) Average or above average intelligence Motivation for treatment/growth  Allergies: No Known Allergies  Home Medications:  (Not in a hospital admission)  OB/GYN Status:  No LMP for male patient.  General Assessment Data Location of Assessment: [facility] ED Is this a Tele or Face-to-Face Assessment?: Face-to-Face Is  this an Initial Assessment or a Re-assessment for this encounter?: Initial Assessment Living Arrangements: Spouse/significant other Can pt return to current living arrangement?: Yes Admission Status: Voluntary Is patient capable of signing voluntary admission?: Yes Transfer from: Home Referral Source: Self/Family/Friend     [place] Living Arrangements: Spouse/significant other Name of  Psychiatrist: No provider reported at this time.  Name of Therapist: No provider reported at this time.   Education Status Is patient currently in school?: No  Risk to self with the past 6 months Suicidal Ideation: No Suicidal Intent: No Is patient at risk for suicide?: No Suicidal Plan?: No Access to Means: No What has been your use of drugs/alcohol within the last 12 months?: Daily alcohol use reported.  Previous Attempts/Gestures: No How many times?: 0 Other Self Harm Risks: No other self harm risk identified at this time.  Triggers for Past Attempts: None known Intentional Self Injurious Behavior: None Family Suicide History: No Recent stressful life event(s): Loss (Comment) (Death of friend([facility]); Retirement, sold dream hom) Persecutory voices/beliefs?: No Depression: No Depression Symptoms: Tearfulness, Guilt, Loss of interest in usual pleasures Substance abuse history and/or treatment for substance abuse?: Yes Suicide prevention information given to non-admitted patients: Not applicable  Risk to Others within the past 6 months Homicidal Ideation: No Thoughts of Harm to Others: No Current Homicidal Intent: No Current Homicidal Plan: No Access to Homicidal Means: No Identified Victim: NA History of harm to others?: No Assessment of Violence: None Noted Violent Behavior Description: No violent behaviors observed. Pt is calm and cooperative at this time.  Does patient have access to weapons?: No Criminal Charges Pending?: No Does patient have a court date: Yes Court Date:  (October 2016)  Psychosis Hallucinations: None noted Delusions: None noted  Mental Status Report Appear/Hygiene: In scrubs Eye Contact: Good Motor Activity: Freedom of movement Speech: Logical/coherent Level of Consciousness: Alert Mood: Pleasant Affect: Appropriate to circumstance Anxiety Level: Minimal Thought Processes: Coherent, Relevant Judgement: Unimpaired Orientation:  Person, Place, Time, Situation Obsessive Compulsive Thoughts/Behaviors: None  Cognitive Functioning Concentration: Normal Memory: Recent Intact, Remote Intact IQ: Average Insight: Good Impulse Control: Fair Appetite: Good Weight Loss: 0 Weight Gain: 0 Sleep: No Change Total Hours of Sleep: 8 Vegetative Symptoms: None  ADLScreening [facility] Assessment Services) Patient's cognitive ability adequate to safely complete daily activities?: Yes Patient able to express need for assistance with ADLs?: Yes Independently performs ADLs?: Yes (appropriate for developmental age)  Prior Inpatient Therapy Prior Inpatient Therapy: Yes Prior Therapy Dates: 2014, 10/15,11/15 Prior Therapy Facilty/Provider(s): [facility], [place]; [place], [place]; [facility] Reason for Treatment: Detox  Prior Outpatient Therapy Prior Outpatient Therapy: No  ADL Screening (condition at time of admission) Patient's cognitive ability adequate to safely complete daily activities?: Yes Is the patient deaf or have difficulty hearing?: No Does the patient have difficulty seeing, even when wearing glasses/contacts?: No Does the patient have difficulty concentrating, remembering, or making decisions?: No Patient able to express need for assistance with ADLs?: Yes Does the patient have difficulty dressing or bathing?: No Independently performs ADLs?: Yes (appropriate for developmental age) Does the patient have difficulty walking or climbing stairs?: No       Abuse/Neglect Assessment (Assessment to be complete while patient is alone) Physical Abuse: Denies Verbal Abuse: Denies Sexual Abuse: Denies Exploitation of patient/patient's resources: Denies Self-Neglect: Denies     Regulatory affairs officer (For Healthcare) Does patient have an advance directive?: No Would patient like information on creating an advanced directive?: No - patient  declined information    Additional Information 1:1 In Past 12 Months?:  No CIRT Risk: No Elopement Risk: No     Disposition:  Disposition Initial Assessment Completed for this Encounter: Yes Disposition of Patient: Referred to [facility] OBS )  Donoven Pett S 09/15/2014 11:06 PM

## 2014-09-15 NOTE — BH Assessment (Signed)
Assessment completed. Consulted Waylan Boga, NP who recommended that pt be sent to [facility] OBS unit Bed 4. Pt has been accepted to OBS Bed 4. Dr. Darl Householder has been informed of the recommendation. Pt has signed voluntary consent forms.

## 2014-09-15 NOTE — ED Notes (Addendum)
Pt presents with etoh w/d, pt c/o nausea, feeling dry. Pt states he is an alcoholic, drinking 2-3 bottles of wine daily x 4-5 years. [facility] month, stayed sober x 2-3 weeks. Last drink 24 hours ago. Pt is retired Engineer, drilling. Denies HA, denies hallucinations.

## 2014-09-15 NOTE — ED Notes (Signed)
Pt aware urine sample is needed, pt stated it will be a long time before that happens.

## 2014-09-16 ENCOUNTER — Encounter (HOSPITAL_COMMUNITY): Payer: Self-pay | Admitting: *Deleted

## 2014-09-16 ENCOUNTER — Observation Stay (HOSPITAL_COMMUNITY)
Admission: AD | Admit: 2014-09-16 | Discharge: 2014-09-17 | Disposition: A | Discharge status: Home / Self Care | Payer: Medicare Other | Source: Intra-hospital | Attending: Psychiatry | Admitting: Psychiatry

## 2014-09-16 DIAGNOSIS — F10230 Alcohol dependence with withdrawal, uncomplicated: Secondary | ICD-10-CM | POA: Diagnosis present

## 2014-09-16 DIAGNOSIS — F10930 Alcohol use, unspecified with withdrawal, uncomplicated: Secondary | ICD-10-CM | POA: Diagnosis present

## 2014-09-16 DIAGNOSIS — F10239 Alcohol dependence with withdrawal, unspecified: Secondary | ICD-10-CM | POA: Diagnosis not present

## 2014-09-16 HISTORY — DX: Depression, unspecified: F32.A

## 2014-09-16 HISTORY — DX: Anxiety disorder, unspecified: F41.9

## 2014-09-16 HISTORY — DX: Major depressive disorder, single episode, unspecified: F32.9

## 2014-09-16 MED ORDER — MAGNESIUM HYDROXIDE 400 MG/5ML PO SUSP: 30.0000 mL | Freq: Every day | ORAL | Status: DC | PRN

## 2014-09-16 MED ORDER — TRAZODONE HCL 50 MG PO TABS
ORAL_TABLET | ORAL | Status: AC
  Filled 2014-09-16: qty 1

## 2014-09-16 MED ORDER — BUPROPION HCL ER (SR) 150 MG PO TB12
150.0000 mg | ORAL_TABLET | Freq: Two times a day (BID) | ORAL | Status: DC
  Administered 2014-09-16 – 2014-09-17 (×3): 150 mg via ORAL
  Filled 2014-09-16 (×6): qty 1

## 2014-09-16 MED ORDER — ALUM & MAG HYDROXIDE-SIMETH 200-200-20 MG/5ML PO SUSP: 30.0000 mL | ORAL | Status: DC | PRN

## 2014-09-16 MED ORDER — LORAZEPAM 1 MG PO TABS: 1.0000 mg | ORAL_TABLET | Freq: Every day | ORAL | Status: DC

## 2014-09-16 MED ORDER — HYDROCHLOROTHIAZIDE 25 MG PO TABS
25.0000 mg | ORAL_TABLET | Freq: Every day | ORAL | Status: DC
  Administered 2014-09-16 – 2014-09-17 (×2): 25 mg via ORAL
  Filled 2014-09-16 (×4): qty 1

## 2014-09-16 MED ORDER — ONDANSETRON 4 MG PO TBDP: 4.0000 mg | ORAL_TABLET | Freq: Four times a day (QID) | ORAL | Status: DC | PRN

## 2014-09-16 MED ORDER — FUROSEMIDE 40 MG PO TABS
40.0000 mg | ORAL_TABLET | Freq: Every day | ORAL | Status: DC
  Administered 2014-09-16 – 2014-09-17 (×2): 40 mg via ORAL
  Filled 2014-09-16: qty 1
  Filled 2014-09-16: qty 2
  Filled 2014-09-16: qty 1
  Filled 2014-09-16: qty 2

## 2014-09-16 MED ORDER — LORAZEPAM 1 MG PO TABS: 0.0000 mg | ORAL_TABLET | Freq: Four times a day (QID) | ORAL | Status: DC

## 2014-09-16 MED ORDER — ADULT MULTIVITAMIN W/MINERALS CH
1.0000 | ORAL_TABLET | Freq: Every day | ORAL | Status: DC
  Administered 2014-09-16 – 2014-09-17 (×2): 1 via ORAL
  Filled 2014-09-16 (×4): qty 1

## 2014-09-16 MED ORDER — HYDROXYZINE HCL 25 MG PO TABS
25.0000 mg | ORAL_TABLET | Freq: Four times a day (QID) | ORAL | Status: DC | PRN
  Administered 2014-09-16: 25 mg via ORAL
  Filled 2014-09-16: qty 1

## 2014-09-16 MED ORDER — VITAMIN B-1 100 MG PO TABS
100.0000 mg | ORAL_TABLET | Freq: Every day | ORAL | Status: DC
Start: 2014-09-17 — End: 2014-09-17
  Administered 2014-09-17: 100 mg via ORAL
  Filled 2014-09-16 (×2): qty 1

## 2014-09-16 MED ORDER — PANTOPRAZOLE SODIUM 40 MG PO TBEC
40.0000 mg | DELAYED_RELEASE_TABLET | Freq: Every day | ORAL | Status: DC
  Administered 2014-09-16 – 2014-09-17 (×2): 40 mg via ORAL
  Filled 2014-09-16 (×4): qty 1

## 2014-09-16 MED ORDER — TRAZODONE HCL 50 MG PO TABS
50.0000 mg | ORAL_TABLET | Freq: Every evening | ORAL | Status: DC | PRN
  Filled 2014-09-16 (×3): qty 1

## 2014-09-16 MED ORDER — LORAZEPAM 1 MG PO TABS: 1.0000 mg | ORAL_TABLET | Freq: Two times a day (BID) | ORAL | Status: DC

## 2014-09-16 MED ORDER — LORAZEPAM 1 MG PO TABS
1.0000 mg | ORAL_TABLET | Freq: Four times a day (QID) | ORAL | Status: AC
  Administered 2014-09-16 (×3): 1 mg via ORAL
  Filled 2014-09-16 (×3): qty 1

## 2014-09-16 MED ORDER — DULOXETINE HCL 60 MG PO CPEP
120.0000 mg | ORAL_CAPSULE | Freq: Every day | ORAL | Status: DC
  Administered 2014-09-16 – 2014-09-17 (×2): 120 mg via ORAL
  Filled 2014-09-16: qty 2
  Filled 2014-09-16: qty 1
  Filled 2014-09-16 (×3): qty 2

## 2014-09-16 MED ORDER — IBUPROFEN 600 MG PO TABS
600.0000 mg | ORAL_TABLET | Freq: Three times a day (TID) | ORAL | Status: DC | PRN
Start: 2014-09-16 — End: 2014-09-17

## 2014-09-16 MED ORDER — TRAZODONE HCL 50 MG PO TABS
50.0000 mg | ORAL_TABLET | Freq: Every evening | ORAL | Status: DC | PRN
  Administered 2014-09-16: 50 mg via ORAL
  Filled 2014-09-16 (×2): qty 1

## 2014-09-16 MED ORDER — LORAZEPAM 1 MG PO TABS: 0.0000 mg | ORAL_TABLET | Freq: Two times a day (BID) | ORAL | Status: DC

## 2014-09-16 MED ORDER — LORAZEPAM 1 MG PO TABS
1.0000 mg | ORAL_TABLET | Freq: Three times a day (TID) | ORAL | Status: DC
  Administered 2014-09-17: 1 mg via ORAL
  Filled 2014-09-16: qty 1

## 2014-09-16 MED ORDER — LOSARTAN POTASSIUM 50 MG PO TABS
100.0000 mg | ORAL_TABLET | Freq: Every day | ORAL | Status: DC
  Administered 2014-09-16 – 2014-09-17 (×2): 100 mg via ORAL
  Filled 2014-09-16 (×3): qty 2

## 2014-09-16 MED ORDER — LOPERAMIDE HCL 2 MG PO CAPS: 2.0000 mg | ORAL_CAPSULE | ORAL | Status: DC | PRN

## 2014-09-16 NOTE — ED Notes (Signed)
Report called to [facility] and [facility] has been called for transport. Will continue to monitor until DC from unit.

## 2014-09-16 NOTE — H&P (Signed)
[place] OBS UNIT H&P   Subjective: Pt seen and chart reviewed. Pt states he has been having trouble with alcohol abuse and withdrawals. He is slightly tremulous at this time. Pt is calm, oriented x4, and cooperative with staff members. Pt denies SI, HI, and AVH, contracts for safety. Will keep overnight to monitor for safety and stabilization.   HPI:  Manuel Escobar is an 68 y.o. male presenting to [facility] requesting help with his alcoholism. Pt reported that he has not had a drink in approximately 24 hrs and he need help with his withdrawals. Pt reported that his drinking has increased during the past 4-5 years. Pt denies SI and did not report any previous suicide attempts. Pt did not report any psychiatric hospitalizations but shared that he has completed detox multiple times in the past. Pt shared that he recently completed detox at [facility] and was able to maintain his sobriety for 2-3 weeks after treatment. Pt reported that he is dealing with some stressors and shared that he recently sold his dream home, retired and loss a friend that he met at [employer].  Pt did not report any issues with his sleep or appetite. Pt denies HI and AVH at this time. Pt denied having access to weapons and firearms. Pt did not report any pending criminal charges but reported that he has a court date October 2065fr violating a restraining order and missing his court date. Pt did not report any illicit substance abuse at this time. PT did not report any physical, sexual or emotional abuse.  Pt is alert and oriented x3. Pt is calm and cooperative at this time. Pt maintained good eye contact and his speech was normal. Pt mood is pleasant and his affect is congruent with his mood. Pt thought process is coherent and relevant. 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 Pt shared that he has a good support system which includes his wife, aunt and uncle.      Axis I: Alcohol Abuse, Major  Depression, Recurrent severe, Substance Abuse and Substance Induced Mood Disorder Axis II: Deferred Axis III:  Past Medical History  Diagnosis Date  . Hypertension   . Alcoholism   . History of pacemaker   . Anxiety   . Depression    Axis IV: other psychosocial or environmental problems and problems related to social environment Axis V: 51-60 moderate symptoms  Psychiatric Specialty Exam:  Physical Exam  ROS  Blood pressure 134/96, pulse 102, temperature 97.9 F (36.6 C), temperature source Oral, resp. rate 18, height 5' 10"  (1.778 m), weight 95.255 kg (210 lb), SpO2 98 %.Body mass index is 30.13 kg/(m^2).  General Appearance: Casual and Fairly Groomed  EEngineer, water:  Good  Speech:  Clear and Coherent and Normal Rate  Volume:  Normal  Mood:  Euthymic  Affect:  Appropriate and Congruent  Thought Process:  Coherent and Goal Directed  Orientation:  Full (Time, Place, and Person)  Thought Content:  WDL  Suicidal Thoughts:  No  Homicidal Thoughts:  No  Memory:  Immediate;   Fair Recent;   Fair Remote;   Fair  Judgement:  Good  Insight:  Good  Psychomotor Activity:  Normal  Concentration:  Good  Recall:  Good  Akathisia:  No  Handed:    AIMS (if indicated):     Assets:  Communication Skills Desire for Improvement Resilience  Sleep:         Past Medical History:  Past Medical History  Diagnosis Date  . Hypertension   . Alcoholism   . History of pacemaker   . Anxiety   . Depression     Past Surgical History  Procedure Laterality Date  . Prostatectomy    . Appendectomy      Family History: History reviewed. No pertinent family history.  Social History:  reports that he has never smoked. He does not have any smokeless tobacco history on file. He reports that he drinks alcohol. He reports that he does not use illicit drugs.  Additional Social History:  Alcohol / Drug Use Pain Medications: denied abuse Prescriptions: HCTZ   lipitor   norvasc   cozaar    cymbalta   welbutrin    xanax Over the Counter: denied abuse History of alcohol / drug use?: Yes Longest period of sobriety (when/how long): couple months Negative Consequences of Use: Financial Withdrawal Symptoms: Other (Comment) (anxiety) Substance #1 Name of Substance 1: alcohol 1 - Age of First Use: 29 years 1 - Amount (size/oz): 2-3 bottles wine daily 1 - Frequency: daily 1 - Duration: 4-5 years 1 - Last Use / Amount: Saturday  CIWA: CIWA-Ar BP: (!) 141/78 mmHg Pulse Rate: 89 Nausea and Vomiting: no nausea and no vomiting Tactile Disturbances: none Tremor: two Auditory Disturbances: not present Paroxysmal Sweats: no sweat visible Visual Disturbances: not present Anxiety: two Headache, Fullness in Head: none present Agitation: normal activity Orientation and Clouding of Sensorium: oriented and can do serial additions CIWA-Ar Total: 4 COWS: Clinical Opiate Withdrawal Scale (COWS) Resting Pulse Rate: Pulse Rate 81-100 Sweating: No report of chills or flushing Restlessness: Able to sit still Pupil Size: Pupils pinned or normal size for room light Bone or Joint Aches: Not present Runny Nose or Tearing: Not present GI Upset: No GI symptoms Tremor: No tremor Yawning: No yawning Anxiety or Irritability: Patient obviously irritable/anxious Gooseflesh Skin: Skin is smooth COWS Total Score: 3  PATIENT STRENGTHS: (choose at least two) Average or above average intelligence Motivation for treatment/growth  Allergies: No Known Allergies  Home Medications:  Medications Prior to Admission  Medication Sig Dispense Refill  . buPROPion (WELLBUTRIN SR) 150 MG 12 hr tablet Take 150 mg by mouth 2 (two) times daily.    . DULoxetine (CYMBALTA) 60 MG capsule Take 120 mg by mouth daily.    . furosemide (LASIX) 40 MG tablet Take 40 mg by mouth daily.    . hydrochlorothiazide (HYDRODIURIL) 25 MG tablet Take 25 mg by mouth daily.    Marland Kitchen losartan (COZAAR) 100 MG tablet Take 100 mg by  mouth daily.    Marland Kitchen omeprazole (PRILOSEC) 20 MG capsule Take 20 mg by mouth daily as needed (acid reflux).    Marland Kitchen tolnaftate (TINACTIN) 1 % powder Apply 1 application topically 2 (two) times daily as needed for itching.      OB/GYN Status:  No LMP for male patient.  Disposition:  Hold in [facility] OBS Unit overnight to assure safety and stabilization.     Addiel, Mccardle, FNP-BC 09/16/2014 12:46 PM

## 2014-09-16 NOTE — ED Notes (Signed)
Received a call from the [facility] at [facility], notifying me they are unable to accept pt to the Observation Unit, added that pt does not meet criteria(<23hrs discharge, not require any medications) to be at that unit, reason being he is "unstable" as evidenced by the recent CIWA score documented, admin of 2mg  Ativan and the Bolus Nacl. Charge RN notified, Attending EDP notified.

## 2014-09-16 NOTE — ED Notes (Signed)
Pt is sleeping. Did not awaken pt to complete full psychiatric assessment.  Will complete once pt awakens.

## 2014-09-16 NOTE — Progress Notes (Addendum)
Patient ate lunch in bed.  Has been sleeping comfortably in bed.  Respirations even and unlabored.  No signs/symptoms of pain/distress noted on patient's face/body movements.  Safety maintained in OBS unit.  1645  Patient woke up, went to the bathroom and was given ice cream and soda for snack.  Patient stated he is feeling better now.  Denied SI and HI.  Denied A/V hallucinations.  Denied pain.  Stated his anxiety has decreased to a 1-2.  Denied depression and hopeless.  Respirations even and unlabored.  No signs/symptoms of pain/distress noted on patient's face/body movements.   Safety maintained.

## 2014-09-16 NOTE — Plan of Care (Signed)
Problem: Ineffective individual coping Goal: LTG: Patient will report a decrease in negative feelings Outcome: Completed/Met Date Met:  09/16/14 Nurse talked to patient about alcohol abuse and effects on health.

## 2014-09-16 NOTE — Progress Notes (Addendum)
Patient's first admission to [facility], voluntary.  Patient stated he retired last summer 2014.  Has been drinking "couple bottles of wine every day or more".  Started drinking at age 68 years old, last drank 48 hours ago.  Denied THC, cocaine, heroin use, etc.  Wife and friends have been concerned about him.  Retired MD Neta Mends advised him and his wife to bring him to [facility].  Patient was at [place] couple weeks ago.  Stated he just moved from his dream house and does not know where he will live after discharge.  He and wife have been living in an apartment approximately one year.  Denied abuse.  Denied SI and HI.  Denied A/V hallucinations.  Denied depression and hopeless.  Stated his anxiety is #7 because of his withdrawals.  Married, no children.   Stated he had prostatectomy approximately 8 years ago.  Small scars on abd from appendectomy.  Dry skin on ft/legs.  Two bruises on upper R chest.  Stated he was taking xanax in hospital.  Food and drink given patient.  Patient has been cooperative and pleasant. [place] [address] has one white bag of clothing, phone, necklace, shoes/socks.

## 2014-09-16 NOTE — Progress Notes (Deleted)
Pt discharged to [facility] via [facility] transportation. -SI/HI, -A/Vhall No c/o pain or discomfort. Pt belongings returned and meds given.  

## 2014-09-17 DIAGNOSIS — F1010 Alcohol abuse, uncomplicated: Secondary | ICD-10-CM | POA: Diagnosis not present

## 2014-09-17 DIAGNOSIS — F1994 Other psychoactive substance use, unspecified with psychoactive substance-induced mood disorder: Secondary | ICD-10-CM | POA: Diagnosis not present

## 2014-09-17 DIAGNOSIS — F332 Major depressive disorder, recurrent severe without psychotic features: Secondary | ICD-10-CM | POA: Diagnosis not present

## 2014-09-17 DIAGNOSIS — F10239 Alcohol dependence with withdrawal, unspecified: Secondary | ICD-10-CM | POA: Diagnosis not present

## 2014-09-17 MED ORDER — LOSARTAN POTASSIUM 100 MG PO TABS: 100.0000 mg | ORAL_TABLET | Freq: Every day | ORAL | Status: DC

## 2014-09-17 MED ORDER — BUPROPION HCL ER (SR) 150 MG PO TB12: 150.0000 mg | ORAL_TABLET | Freq: Two times a day (BID) | ORAL | Status: DC

## 2014-09-17 MED ORDER — DULOXETINE HCL 60 MG PO CPEP: 120.0000 mg | ORAL_CAPSULE | Freq: Every day | ORAL | Status: AC

## 2014-09-17 MED ORDER — FUROSEMIDE 40 MG PO TABS: 40.0000 mg | ORAL_TABLET | Freq: Every day | ORAL | Status: DC

## 2014-09-17 MED ORDER — HYDROCHLOROTHIAZIDE 25 MG PO TABS: 25.0000 mg | ORAL_TABLET | Freq: Every day | ORAL | Status: DC

## 2014-09-17 NOTE — Discharge Summary (Signed)
[place] OBS UNIT DISCHARGE SUMMARY   Subjective: Pt seen and chart reviewed. Pt spent the night in the [facility] OBS Unit without incident. He has been slightly tremulous but this is improving and his last CIWA score was 0. Pt denies SI, HI, and AVH, contracts for safety, and reports that he has followup arrangements with [facility] (confirmed by TTS who assisted with this process). Pt will discharge with these resources.    HPI:  Manuel Escobar is an 68 y.o. male presenting to [facility] requesting help with his alcoholism. Pt reported that he has not had a drink in approximately 24 hrs and he need help with his withdrawals. Pt reported that his drinking has increased during the past 4-5 years. Pt denies SI and did not report any previous suicide attempts. Pt did not report any psychiatric hospitalizations but shared that he has completed detox multiple times in the past. Pt shared that he recently completed detox at [facility] and was able to maintain his sobriety for 2-3 weeks after treatment. Pt reported that he is dealing with some stressors and shared that he recently sold his dream home, retired and loss a friend that he met at [employer].  Pt did not report any issues with his sleep or appetite. Pt denies HI and AVH at this time. Pt denied having access to weapons and firearms. Pt did not report any pending criminal charges but reported that he has a court date October 2015fr violating a restraining order and missing his court date. Pt did not report any illicit substance abuse at this time. PT did not report any physical, sexual or emotional abuse.  Pt is alert and oriented x3. Pt is calm and cooperative at this time. Pt maintained good eye contact and his speech was normal. Pt mood is pleasant and his affect is congruent with his mood. Pt thought process is coherent and relevant. 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 Pt shared that he has a good support system which  includes his wife, aunt and uncle.      Axis I: Alcohol Abuse, Major Depression, Recurrent severe, Substance Abuse and Substance Induced Mood Disorder Axis II: Deferred Axis III:  Past Medical History  Diagnosis Date  . Hypertension   . Alcoholism   . History of pacemaker   . Anxiety   . Depression    Axis IV: other psychosocial or environmental problems and problems related to social environment Axis V: 51-60 moderate symptoms  General Appearance: Casual and Fairly Groomed  Eye Contact:: Good  Speech: Clear and Coherent and Normal Rate  Volume: Normal  Mood: Euthymic  Affect: Appropriate and Congruent  Thought Process: Coherent and Goal Directed  Orientation: Full (Time, Place, and Person)  Thought Content: WDL  Suicidal Thoughts: No  Homicidal Thoughts: No  Memory: Immediate; Fair Recent; Fair Remote; Fair  Judgement: Good  Insight: Good  Psychomotor Activity: Normal  Concentration: Good  Recall: Good  Akathisia: No  Handed:   AIMS (if indicated):    Assets: Communication Skills Desire for Improvement Resilience  Sleep:       Past Medical History:  Past Medical History  Diagnosis Date  . Hypertension   . Alcoholism   . History of pacemaker   . Anxiety   . Depression     Past Surgical History  Procedure Laterality Date  . Prostatectomy    . Appendectomy      Family History: History reviewed. No  pertinent family history.  Social History:  reports that he has never smoked. He does not have any smokeless tobacco history on file. He reports that he drinks alcohol. He reports that he does not use illicit drugs.  Additional Social History:  Alcohol / Drug Use Pain Medications: denied abuse Prescriptions: HCTZ   lipitor   norvasc   cozaar   cymbalta   welbutrin    xanax Over the Counter: denied abuse History of alcohol / drug use?: Yes Longest period of sobriety (when/how long): couple months Negative  Consequences of Use: Financial Withdrawal Symptoms: Other (Comment) (anxiety) Substance #1 Name of Substance 1: alcohol 1 - Age of First Use: 29 years 1 - Amount (size/oz): 2-3 bottles wine daily 1 - Frequency: daily 1 - Duration: 4-5 years 1 - Last Use / Amount: Saturday  CIWA: CIWA-Ar BP: (!) 141/78 mmHg Pulse Rate: 89 Nausea and Vomiting: no nausea and no vomiting Tactile Disturbances: none Tremor: two Auditory Disturbances: not present Paroxysmal Sweats: no sweat visible Visual Disturbances: not present Anxiety: two Headache, Fullness in Head: none present Agitation: normal activity Orientation and Clouding of Sensorium: oriented and can do serial additions CIWA-Ar Total: 4 COWS: Clinical Opiate Withdrawal Scale (COWS) Resting Pulse Rate: Pulse Rate 81-100 Sweating: No report of chills or flushing Restlessness: Able to sit still Pupil Size: Pupils pinned or normal size for room light Bone or Joint Aches: Not present Runny Nose or Tearing: Not present GI Upset: No GI symptoms Tremor: No tremor Yawning: No yawning Anxiety or Irritability: Patient obviously irritable/anxious Gooseflesh Skin: Skin is smooth COWS Total Score: 3  PATIENT STRENGTHS: (choose at least two) Average or above average intelligence Motivation for treatment/growth  Allergies: No Known Allergies  Home Medications:  Medications Prior to Admission  Medication Sig Dispense Refill  . buPROPion (WELLBUTRIN SR) 150 MG 12 hr tablet Take 150 mg by mouth 2 (two) times daily.    . DULoxetine (CYMBALTA) 60 MG capsule Take 120 mg by mouth daily.    . furosemide (LASIX) 40 MG tablet Take 40 mg by mouth daily.    . hydrochlorothiazide (HYDRODIURIL) 25 MG tablet Take 25 mg by mouth daily.    Marland Kitchen losartan (COZAAR) 100 MG tablet Take 100 mg by mouth daily.    Marland Kitchen omeprazole (PRILOSEC) 20 MG capsule Take 20 mg by mouth daily as needed (acid reflux).    Marland Kitchen tolnaftate (TINACTIN) 1 % powder Apply 1 application  topically 2 (two) times daily as needed for itching.      OB/GYN Status:  No LMP for male patient.  Disposition:  -Discharge home with outpatient resources for alcoholism, depression, and counseling.  -Pt has arrangements to present to [facility] as assisted by TTS  Benjamine Mola, FNP-BC 09/17/2014 1:47 AM

## 2014-09-17 NOTE — BHH Counselor (Signed)
Counselor spoke with Manuel Escobar. Per Heloise Purpura the Pt can be discharged. Pt has outside resources. Lance Bosch with [facility] has spoken with the Pt about their program. Pt will attend the [facility] program and [place] meetings.   Lorenza Cambridge, [facility] Triage Specialist

## 2014-09-17 NOTE — Progress Notes (Signed)
Pt alert, oriented and cooperative. Affect/mood sad but brightens on approach.  -SI/HI, -A/Vhall. Denies pain or discomfort. Emotional support and encouragement given. Will monitor closely and evaluate for stabilization.  

## 2014-09-17 NOTE — Progress Notes (Signed)
Mr. Manuel Escobar has been given his belongings and had his AVS explained. Pt is in NAD distress. Discharged to lobby, awaiting car to [employer].

## 2014-11-01 ENCOUNTER — Emergency Department (HOSPITAL_COMMUNITY)
Admission: EM | Admit: 2014-11-01 | Discharge: 2014-11-02 | Disposition: A | Discharge status: Home / Self Care | Payer: Medicare Other | Attending: Emergency Medicine | Admitting: Emergency Medicine

## 2014-11-01 ENCOUNTER — Encounter (HOSPITAL_COMMUNITY): Payer: Self-pay | Admitting: Oncology

## 2014-11-01 DIAGNOSIS — F10920 Alcohol use, unspecified with intoxication, uncomplicated: Secondary | ICD-10-CM

## 2014-11-01 DIAGNOSIS — Z950 Presence of cardiac pacemaker: Secondary | ICD-10-CM | POA: Insufficient documentation

## 2014-11-01 DIAGNOSIS — F1010 Alcohol abuse, uncomplicated: Secondary | ICD-10-CM

## 2014-11-01 DIAGNOSIS — F329 Major depressive disorder, single episode, unspecified: Secondary | ICD-10-CM | POA: Diagnosis not present

## 2014-11-01 DIAGNOSIS — F419 Anxiety disorder, unspecified: Secondary | ICD-10-CM | POA: Diagnosis not present

## 2014-11-01 DIAGNOSIS — Z79899 Other long term (current) drug therapy: Secondary | ICD-10-CM | POA: Insufficient documentation

## 2014-11-01 DIAGNOSIS — F10230 Alcohol dependence with withdrawal, uncomplicated: Secondary | ICD-10-CM | POA: Diagnosis not present

## 2014-11-01 DIAGNOSIS — I10 Essential (primary) hypertension: Secondary | ICD-10-CM | POA: Insufficient documentation

## 2014-11-01 DIAGNOSIS — Z046 Encounter for general psychiatric examination, requested by authority: Secondary | ICD-10-CM | POA: Diagnosis present

## 2014-11-01 LAB — I-STAT CHEM 8, ED
BUN: 16 mg/dL (ref 6–23)
CHLORIDE: 101 mmol/L (ref 96–112)
Calcium, Ion: 1.14 mmol/L (ref 1.13–1.30)
Creatinine, Ser: 1.4 mg/dL — ABNORMAL HIGH (ref 0.50–1.35)
Glucose, Bld: 98 mg/dL (ref 70–99)
HEMATOCRIT: 45 % (ref 39.0–52.0)
HEMOGLOBIN: 15.3 g/dL (ref 13.0–17.0)
Potassium: 3.8 mmol/L (ref 3.5–5.1)
Sodium: 143 mmol/L (ref 135–145)
TCO2: 28 mmol/L (ref 0–100)

## 2014-11-01 LAB — CBC
HCT: 39.3 % (ref 39.0–52.0)
Hemoglobin: 13.2 g/dL (ref 13.0–17.0)
MCH: 29.4 pg (ref 26.0–34.0)
MCHC: 33.6 g/dL (ref 30.0–36.0)
MCV: 87.5 fL (ref 78.0–100.0)
PLATELETS: 265 10*3/uL (ref 150–400)
RBC: 4.49 MIL/uL (ref 4.22–5.81)
RDW: 14.9 % (ref 11.5–15.5)
WBC: 6.5 10*3/uL (ref 4.0–10.5)

## 2014-11-01 LAB — RAPID URINE DRUG SCREEN, HOSP PERFORMED
Amphetamines: NOT DETECTED
BARBITURATES: NOT DETECTED
Benzodiazepines: NOT DETECTED
Cocaine: NOT DETECTED
OPIATES: NOT DETECTED
Tetrahydrocannabinol: NOT DETECTED

## 2014-11-01 LAB — ACETAMINOPHEN LEVEL

## 2014-11-01 LAB — ETHANOL: ALCOHOL ETHYL (B): 173 mg/dL — AB (ref 0–9)

## 2014-11-01 LAB — SALICYLATE LEVEL: Salicylate Lvl: <4 mg/dL (ref 2.8–20.0)

## 2014-11-01 MED ORDER — ONDANSETRON HCL 4 MG PO TABS: 4.0000 mg | ORAL_TABLET | Freq: Three times a day (TID) | ORAL | Status: DC | PRN

## 2014-11-01 MED ORDER — ALUM & MAG HYDROXIDE-SIMETH 200-200-20 MG/5ML PO SUSP: 30.0000 mL | ORAL | Status: DC | PRN

## 2014-11-01 MED ORDER — LORAZEPAM 1 MG PO TABS: 1.0000 mg | ORAL_TABLET | Freq: Three times a day (TID) | ORAL | Status: DC | PRN

## 2014-11-01 MED ORDER — HYDROCHLOROTHIAZIDE 25 MG PO TABS
25.0000 mg | ORAL_TABLET | Freq: Every day | ORAL | Status: DC
  Administered 2014-11-02: 25 mg via ORAL
  Filled 2014-11-01: qty 1

## 2014-11-01 MED ORDER — FUROSEMIDE 40 MG PO TABS
40.0000 mg | ORAL_TABLET | Freq: Every day | ORAL | Status: DC
  Administered 2014-11-02: 40 mg via ORAL
  Filled 2014-11-01: qty 1

## 2014-11-01 MED ORDER — LOSARTAN POTASSIUM 50 MG PO TABS
100.0000 mg | ORAL_TABLET | Freq: Every day | ORAL | Status: DC
  Administered 2014-11-02: 100 mg via ORAL
  Filled 2014-11-01: qty 2

## 2014-11-01 MED ORDER — ZOLPIDEM TARTRATE 5 MG PO TABS
5.0000 mg | ORAL_TABLET | Freq: Every evening | ORAL | Status: DC | PRN
  Administered 2014-11-02: 5 mg via ORAL
  Filled 2014-11-01: qty 1

## 2014-11-01 MED ORDER — PANTOPRAZOLE SODIUM 40 MG PO TBEC
40.0000 mg | DELAYED_RELEASE_TABLET | Freq: Every day | ORAL | Status: DC
  Administered 2014-11-02: 40 mg via ORAL
  Filled 2014-11-01: qty 1

## 2014-11-01 MED ORDER — DULOXETINE HCL 60 MG PO CPEP
120.0000 mg | ORAL_CAPSULE | Freq: Every day | ORAL | Status: DC
  Administered 2014-11-02: 120 mg via ORAL
  Filled 2014-11-01: qty 2

## 2014-11-01 MED ORDER — NICOTINE 21 MG/24HR TD PT24
21.0000 mg | MEDICATED_PATCH | Freq: Every day | TRANSDERMAL | Status: DC
  Filled 2014-11-01: qty 1

## 2014-11-01 MED ORDER — BUPROPION HCL ER (SR) 150 MG PO TB12
150.0000 mg | ORAL_TABLET | Freq: Two times a day (BID) | ORAL | Status: DC
  Administered 2014-11-02 (×2): 150 mg via ORAL
  Filled 2014-11-01 (×3): qty 1

## 2014-11-01 NOTE — BH Assessment (Signed)
Assessment completed. Consulted Serena Colonel, NP who recommended that pt be re-evaluated by psychiatry in the morning. Dr. Canary Brim has been informed of the recommendation.
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
## 2014-11-01 NOTE — ED Notes (Signed)
Per pt he was sitting outside his apartment w/ his car on to keep warm however he had been drinking.  Pt states he was arrested for DUI.  In IVC paperwork it states that pt's wife has a restraining order which pt denies any knowledge of.  Pt is calm and cooperative w/ this Probation officer.

## 2014-11-01 NOTE — ED Notes (Signed)
Pt. To [facility] from ED ambulatory without difficulty, to room  . Report from [employer]. Pt. Is alert and oriented, warm and dry in no distress. Pt. Denies SI, HI, and AVH. Pt. Calm and cooperative. Pt. Made aware of security cameras and Q15 minute rounds. Pt. Encouraged to let Nursing staff know of any concerns or needs. Soft drink and sandwich given.

## 2014-11-02 DIAGNOSIS — F10230 Alcohol dependence with withdrawal, uncomplicated: Secondary | ICD-10-CM | POA: Diagnosis present

## 2014-11-02 NOTE — Consult Note (Signed)
[facility] Consult   Reason for Consult:  Alcohol detox Referring Physician:  EDP Patient Identification: Manuel Escobar MRN:  [id number] Principal Diagnosis: Alcohol dependence with uncomplicated withdrawal Diagnosis:   Patient Active Problem List   Diagnosis Date Noted  . Alcohol dependence with uncomplicated withdrawal [D32.671] 11/02/2014    Priority: High    Total Time spent with patient: 45 minutes  Subjective:   Manuel Escobar is a 69 y.o. male patient does not warrant admission.  HPI:  The patient was drinking yesterday and committed by his wife.  He left [facility] a week ago and started drinking two days ago.  He reports he went to his car and started drinking but did not drive.  His wife called the police and had him arrested with a DWI.  He and his wife are separated but living in the same apartment.  Manuel Escobar is calm and cooperative, denies withdrawal symptoms.  He denies suicidal/homicidal ideations, hallucinations, and drug abuse.  Manuel Escobar would like to discharge and first called his wife for a ride but she declined so he called a friend who will pick him up and take him to a hotel to stay.  He has no previous history of self-harm or aggression or physical altercations.  Stable for discharge.  HPI Elements:   Location:  generalized. Quality:  acute. Severity:  mild. Timing:  intermittent. Duration:  2 days . Context:  stressors.  Past Medical History:  Past Medical History  Diagnosis Date  . Hypertension   . Alcoholism   . History of pacemaker   . Anxiety   . Depression     Past Surgical History  Procedure Laterality Date  . Prostatectomy    . Appendectomy     Family History: History reviewed. No pertinent family history. Social History:  History  Alcohol Use  . Yes    Comment: 2-3 bottles of wine daily     History  Drug Use No    History   Social History  . Marital Status: Married    Spouse Name: N/A    Number of Children: N/A  . Years of  Education: N/A   Social History Main Topics  . Smoking status: Never Smoker   . Smokeless tobacco: None  . Alcohol Use: Yes     Comment: 2-3 bottles of wine daily  . Drug Use: No  . Sexual Activity: None   Other Topics Concern  . None   Social History Narrative   Additional Social History:    Pain Medications: denies abuse  Prescriptions: denies abuse  Over the Counter: denies abuse  History of alcohol / drug use?: Yes Withdrawal Symptoms:  (No withdrawal symptoms reported. ) Name of Substance 1: Alcohol  1 - Age of First Use: 29 1 - Amount (size/oz): "1-2 per day"  1 - Frequency: daily  1 - Duration: 1 day  1 - Last Use / Amount: 11-01-14 "3 glasses of wine"                    Allergies:  No Known Allergies  Vitals: Blood pressure 134/83, pulse 88, temperature 98.6 F (37 C), temperature source Oral, resp. rate 18, SpO2 98 %.  Risk to Self: Suicidal Ideation: No Suicidal Intent: No Is patient at risk for suicide?: No Suicidal Plan?: No Access to Means: No What has been your use of drugs/alcohol within the last 12 months?: Alcohol use since 10-31-14. (Pt recently completed treatment at [facility]. ) How  many times?: 0 Other Self Harm Risks: No other self harm risk identified at this time.  Triggers for Past Attempts: None known Intentional Self Injurious Behavior: None Risk to Others: Homicidal Ideation: No Thoughts of Harm to Others: No Current Homicidal Intent: No Current Homicidal Plan: No Access to Homicidal Means: No Identified Victim: NA History of harm to others?: No Assessment of Violence: On admission Violent Behavior Description: No violent behaviors observed. Pt is calm and cooperative at this time.  Does patient have access to weapons?: No Criminal Charges Pending?: Yes Describe Pending Criminal Charges: DUI Does patient have a court date: Yes Court Date: 12/02/14 Prior Inpatient Therapy: Prior Inpatient Therapy: Yes Prior Therapy  Dates: 2014, 10/15,11/15, 12/15 Prior Therapy Facilty/Provider(s): [facility], [place]; [place], [place]; [facility] Reason for Treatment: Detox Prior Outpatient Therapy: Prior Outpatient Therapy: Yes Prior Therapy Dates: 10-31-14 Prior Therapy Facilty/Provider(s): [facility]  Reason for Treatment: SA  Current Facility-Administered Medications  Medication Dose Route Frequency Provider Last Rate Last Dose  . alum & mag hydroxide-simeth (MAALOX/MYLANTA) 200-200-20 MG/5ML suspension 30 mL  30 mL Oral PRN Threasa Beards, MD      . buPROPion [facility] SR) 12 hr tablet 150 mg  150 mg Oral BID Threasa Beards, MD   150 mg at 11/02/14 0110  . DULoxetine (CYMBALTA) DR capsule 120 mg  120 mg Oral Daily Threasa Beards, MD      . furosemide (LASIX) tablet 40 mg  40 mg Oral Daily Threasa Beards, MD      . hydrochlorothiazide (HYDRODIURIL) tablet 25 mg  25 mg Oral Daily Threasa Beards, MD      . LORazepam (ATIVAN) tablet 1 mg  1 mg Oral Q8H PRN Threasa Beards, MD   1 mg at 11/02/14 0301  . losartan (COZAAR) tablet 100 mg  100 mg Oral Daily Threasa Beards, MD      . nicotine (NICODERM CQ - dosed in mg/24 hours) patch 21 mg  21 mg Transdermal Daily Threasa Beards, MD   21 mg at 11/01/14 2138  . ondansetron (ZOFRAN) tablet 4 mg  4 mg Oral Q8H PRN Threasa Beards, MD      . pantoprazole (PROTONIX) EC tablet 40 mg  40 mg Oral Daily Threasa Beards, MD      . zolpidem (AMBIEN) tablet 5 mg  5 mg Oral QHS PRN Threasa Beards, MD   5 mg at 11/02/14 0300   Current Outpatient Prescriptions  Medication Sig Dispense Refill  . DULoxetine (CYMBALTA) 60 MG capsule Take 2 capsules (120 mg total) by mouth daily.  3  . furosemide (LASIX) 40 MG tablet Take 1 tablet (40 mg total) by mouth daily. 30 tablet   . hydrochlorothiazide (HYDRODIURIL) 25 MG tablet Take 1 tablet (25 mg total) by mouth daily.    Marland Kitchen losartan (COZAAR) 100 MG tablet Take 1 tablet (100 mg total) by mouth daily.    Marland Kitchen omeprazole (PRILOSEC) 20 MG  capsule Take 20 mg by mouth daily as needed (acid reflux).    Marland Kitchen buPROPion (WELLBUTRIN SR) 150 MG 12 hr tablet Take 1 tablet (150 mg total) by mouth 2 (two) times daily. (Patient not taking: Reported on 11/01/2014)    . tolnaftate (TINACTIN) 1 % powder Apply 1 application topically 2 (two) times daily as needed for itching.      Musculoskeletal: Strength & Muscle Tone: within normal limits Gait & Station: normal Patient leans: N/A  Psychiatric Specialty Exam:  Blood pressure 134/83, pulse 88, temperature 98.6 F (37 C), temperature source Oral, resp. rate 18, SpO2 98 %.There is no weight on file to calculate BMI.  General Appearance: Casual  Eye Contact::  Good  Speech:  Normal Rate  Volume:  Normal  Mood:  Anxious  Affect:  Congruent  Thought Process:  Coherent  Orientation:  Full (Time, Place, and Person)  Thought Content:  WDL  Suicidal Thoughts:  No  Homicidal Thoughts:  No  Memory:  Immediate;   Good Recent;   Good Remote;   Good  Judgement:  Fair  Insight:  Fair  Psychomotor Activity:  Normal  Concentration:  Good  Recall:  Good  Fund of Knowledge:Good  Language: Good  Akathisia:  No  Handed:  Right  AIMS (if indicated):     Assets:  Agricultural consultant Housing Leisure Time Physical Health Resilience Social Support Transportation  ADL's:  Intact  Cognition: WNL  Sleep:      Medical Decision Making: Established Problem, Stable/Improving (1), Review of Psycho-Social Stressors (1), Review or order clinical lab tests (1) and Review of Medication Regimen & Side Effects (2)  Problem Points: Established problem, stable/improving (1)  Data Points: Review of medication regiment & side effects (2)  Treatment Plan Summary: Daily contact with patient to assess and evaluate symptoms and progress in treatment, Medication management and Plan discharge to a friend, he already has resources  Plan:  No evidence of imminent risk to self  or others at present.   Disposition: Discharge to his friend and follow-up with his out-patient support group.  Waylan Boga, [place] 11/02/2014 8:02 AM  Patient seen, evaluated and I agree with notes by Nurse Practitioner. Corena Pilgrim, MD

## 2014-11-02 NOTE — ED Notes (Signed)
Pt given AVS, received/signed for all belongings, and verbalized feelings of safety. No acute distress noted. He was escorted to lobby by Jenny Reichmann, MHT.

## 2014-11-02 NOTE — BH Assessment (Signed)
Spoke with pt's wife who reported that pt is prescribed the following medications: Lipitor 40 mg Acamprosate 330 mg Trazodone 50 mg at bed time  Cymbalta 60 mg. 2 capsule daily  Lasix 40 mg 1x daily  Multivitamin

## 2014-11-02 NOTE — ED Notes (Signed)
Pt. C/o insomnia. 

## 2014-11-02 NOTE — BHH Suicide Risk Assessment (Signed)
Suicide Risk Assessment  Discharge Assessment   [facility] Discharge Suicide Risk Assessment   Demographic Factors:  Male, Age 69 or older and Caucasian  Total Time spent with patient: 45 minutes   Musculoskeletal: Strength & Muscle Tone: within normal limits Gait & Station: normal Patient leans: N/A  Psychiatric Specialty Exam:     Blood pressure 134/83, pulse 88, temperature 98.6 F (37 C), temperature source Oral, resp. rate 18, SpO2 98 %.There is no weight on file to calculate BMI.  General Appearance: Casual  Eye Contact::  Good  Speech:  Normal Rate  Volume:  Normal  Mood:  Anxious  Affect:  Congruent  Thought Process:  Coherent  Orientation:  Full (Time, Place, and Person)  Thought Content:  WDL  Suicidal Thoughts:  No  Homicidal Thoughts:  No  Memory:  Immediate;   Good Recent;   Good Remote;   Good  Judgement:  Fair  Insight:  Fair  Psychomotor Activity:  Normal  Concentration:  Good  Recall:  Good  Fund of Knowledge:Good  Language: Good  Akathisia:  No  Handed:  Right  AIMS (if indicated):     Assets:  Agricultural consultant Housing Leisure Time Physical Health Resilience Social Support Transportation  ADL's:  Intact  Cognition: WNL  Sleep:         Has this patient used any form of tobacco in the last 30 days? (Cigarettes, Smokeless Tobacco, Cigars, and/or Pipes) No  Mental Status Per Nursing Assessment::   On Admission:   Alcohol intoxication  Current Mental Status by Physician: NA  Loss Factors: NA  Historical Factors: NA  Risk Reduction Factors:   Sense of responsibility to family, Positive social support and Positive therapeutic relationship  Continued Clinical Symptoms:  Mild anxiety  Cognitive Features That Contribute To Risk:  None    Suicide Risk:  Minimal: No identifiable suicidal ideation.  Patients presenting with no risk factors but with morbid ruminations; may be classified as minimal risk  based on the severity of the depressive symptoms  Principal Problem: Alcohol dependence with uncomplicated withdrawal Discharge Diagnoses:  Patient Active Problem List   Diagnosis Date Noted  . Alcohol dependence with uncomplicated withdrawal [T70.177] 11/02/2014    Priority: High      Plan Of Care/Follow-up recommendations:  Activity:  as tolerated Diet:  heart healthy diet  Is patient on multiple antipsychotic therapies at discharge:  No   Has Patient had three or more failed trials of antipsychotic monotherapy by history:  No  Recommended Plan for Multiple Antipsychotic Therapies: NA    LORD, JAMISON, PMH-NP 11/02/2014, 11:00 AM

## 2015-03-18 DIAGNOSIS — I499 Cardiac arrhythmia, unspecified: Secondary | ICD-10-CM | POA: Diagnosis not present

## 2015-03-18 DIAGNOSIS — Z950 Presence of cardiac pacemaker: Secondary | ICD-10-CM | POA: Diagnosis not present

## 2015-04-08 DIAGNOSIS — H2513 Age-related nuclear cataract, bilateral: Secondary | ICD-10-CM | POA: Diagnosis not present

## 2015-05-16 DIAGNOSIS — I10 Essential (primary) hypertension: Secondary | ICD-10-CM | POA: Diagnosis not present

## 2015-05-16 DIAGNOSIS — Z23 Encounter for immunization: Secondary | ICD-10-CM | POA: Diagnosis not present

## 2015-05-16 DIAGNOSIS — F329 Major depressive disorder, single episode, unspecified: Secondary | ICD-10-CM | POA: Diagnosis not present

## 2015-05-16 DIAGNOSIS — I350 Nonrheumatic aortic (valve) stenosis: Secondary | ICD-10-CM | POA: Diagnosis not present

## 2015-05-16 DIAGNOSIS — Z8546 Personal history of malignant neoplasm of prostate: Secondary | ICD-10-CM | POA: Diagnosis not present

## 2015-05-16 DIAGNOSIS — G4700 Insomnia, unspecified: Secondary | ICD-10-CM | POA: Diagnosis not present

## 2015-05-16 DIAGNOSIS — M797 Fibromyalgia: Secondary | ICD-10-CM | POA: Diagnosis not present

## 2015-05-16 DIAGNOSIS — Z7689 Persons encountering health services in other specified circumstances: Secondary | ICD-10-CM | POA: Diagnosis not present

## 2015-05-16 DIAGNOSIS — Z87898 Personal history of other specified conditions: Secondary | ICD-10-CM | POA: Diagnosis not present

## 2015-07-21 DIAGNOSIS — M797 Fibromyalgia: Secondary | ICD-10-CM | POA: Diagnosis not present

## 2015-07-21 DIAGNOSIS — I10 Essential (primary) hypertension: Secondary | ICD-10-CM | POA: Diagnosis not present

## 2015-07-21 DIAGNOSIS — I350 Nonrheumatic aortic (valve) stenosis: Secondary | ICD-10-CM | POA: Diagnosis not present

## 2015-07-21 DIAGNOSIS — F329 Major depressive disorder, single episode, unspecified: Secondary | ICD-10-CM | POA: Diagnosis not present

## 2015-07-21 DIAGNOSIS — Z79899 Other long term (current) drug therapy: Secondary | ICD-10-CM | POA: Diagnosis not present

## 2015-07-21 DIAGNOSIS — I6529 Occlusion and stenosis of unspecified carotid artery: Secondary | ICD-10-CM | POA: Diagnosis not present

## 2015-07-21 DIAGNOSIS — Z8674 Personal history of sudden cardiac arrest: Secondary | ICD-10-CM | POA: Diagnosis not present

## 2015-07-21 DIAGNOSIS — Z23 Encounter for immunization: Secondary | ICD-10-CM | POA: Diagnosis not present

## 2015-07-21 DIAGNOSIS — Z8546 Personal history of malignant neoplasm of prostate: Secondary | ICD-10-CM | POA: Diagnosis not present

## 2015-07-24 DIAGNOSIS — Z79899 Other long term (current) drug therapy: Secondary | ICD-10-CM | POA: Diagnosis not present

## 2015-07-24 DIAGNOSIS — I10 Essential (primary) hypertension: Secondary | ICD-10-CM | POA: Diagnosis not present

## 2015-07-24 DIAGNOSIS — Z136 Encounter for screening for cardiovascular disorders: Secondary | ICD-10-CM | POA: Diagnosis not present

## 2015-07-24 DIAGNOSIS — Z8546 Personal history of malignant neoplasm of prostate: Secondary | ICD-10-CM | POA: Diagnosis not present

## 2015-07-24 DIAGNOSIS — Z8674 Personal history of sudden cardiac arrest: Secondary | ICD-10-CM | POA: Diagnosis not present

## 2015-08-06 ENCOUNTER — Other Ambulatory Visit: Payer: Self-pay | Admitting: Family

## 2015-08-06 DIAGNOSIS — I6529 Occlusion and stenosis of unspecified carotid artery: Secondary | ICD-10-CM

## 2015-08-07 DIAGNOSIS — Z950 Presence of cardiac pacemaker: Secondary | ICD-10-CM | POA: Diagnosis not present

## 2015-08-07 DIAGNOSIS — I495 Sick sinus syndrome: Secondary | ICD-10-CM | POA: Diagnosis not present

## 2015-08-14 DIAGNOSIS — Z8674 Personal history of sudden cardiac arrest: Secondary | ICD-10-CM | POA: Diagnosis not present

## 2015-08-14 DIAGNOSIS — Z950 Presence of cardiac pacemaker: Secondary | ICD-10-CM | POA: Diagnosis not present

## 2015-08-14 DIAGNOSIS — I350 Nonrheumatic aortic (valve) stenosis: Secondary | ICD-10-CM | POA: Diagnosis not present

## 2015-08-14 DIAGNOSIS — I10 Essential (primary) hypertension: Secondary | ICD-10-CM | POA: Diagnosis not present

## 2015-08-21 DIAGNOSIS — I350 Nonrheumatic aortic (valve) stenosis: Secondary | ICD-10-CM | POA: Diagnosis not present

## 2015-08-21 DIAGNOSIS — I6523 Occlusion and stenosis of bilateral carotid arteries: Secondary | ICD-10-CM | POA: Diagnosis not present

## 2015-08-21 DIAGNOSIS — I495 Sick sinus syndrome: Secondary | ICD-10-CM | POA: Diagnosis not present

## 2015-12-01 DIAGNOSIS — Z950 Presence of cardiac pacemaker: Secondary | ICD-10-CM | POA: Diagnosis not present

## 2016-02-18 DIAGNOSIS — Z950 Presence of cardiac pacemaker: Secondary | ICD-10-CM | POA: Diagnosis not present

## 2016-02-18 DIAGNOSIS — I350 Nonrheumatic aortic (valve) stenosis: Secondary | ICD-10-CM | POA: Diagnosis not present

## 2016-02-18 DIAGNOSIS — Z8674 Personal history of sudden cardiac arrest: Secondary | ICD-10-CM | POA: Diagnosis not present

## 2016-02-18 DIAGNOSIS — I10 Essential (primary) hypertension: Secondary | ICD-10-CM | POA: Diagnosis not present

## 2016-02-26 ENCOUNTER — Ambulatory Visit (INDEPENDENT_AMBULATORY_CARE_PROVIDER_SITE_OTHER): Payer: Medicare Other | Admitting: Internal Medicine

## 2016-02-26 VITALS — BP 94/62 | HR 60 | Ht 70.0 in | Wt 213.8 lb

## 2016-02-26 DIAGNOSIS — I472 Ventricular tachycardia, unspecified: Secondary | ICD-10-CM

## 2016-02-26 DIAGNOSIS — I350 Nonrheumatic aortic (valve) stenosis: Secondary | ICD-10-CM | POA: Diagnosis not present

## 2016-02-26 NOTE — Progress Notes (Signed)
HPI Dr. Clydene Laming is referred today for evaluation of PVC's and NSVT on his heart monitor. 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 The patient underwent PPM insertion and has had no additional recurrences. The patient has aortic stenosis but no known CAD and despite a significant gradient of 38 mmHg (mean) he has no chest pain, sob or syncope. The patient exercises regularly and is asymptomatic and he does not feel any significant palpitations.   No Known Allergies   Current Outpatient Prescriptions  Medication Sig Dispense Refill  . atorvastatin (LIPITOR) 40 MG tablet Take 40 mg by mouth daily.  5  . DULoxetine (CYMBALTA) 60 MG capsule Take 2 capsules (120 mg total) by mouth daily.  3  . furosemide (LASIX) 40 MG tablet Take 1 tablet (40 mg total) by mouth daily. 30 tablet   . losartan (COZAAR) 100 MG tablet Take 1 tablet (100 mg total) by mouth daily.    . metaxalone (SKELAXIN) 800 MG tablet Take 800 mg by mouth every 8 (eight) hours as needed for muscle spasms.    . metoprolol (LOPRESSOR) 50 MG tablet Take 50 mg by mouth 2 (two) times daily.    Marland Kitchen omeprazole (PRILOSEC) 20 MG capsule Take 20 mg by mouth daily as needed (acid reflux).    . potassium chloride SA (K-DUR,KLOR-CON) 20 MEQ tablet Take 20 mEq by mouth daily.     No current facility-administered medications for this visit.     Past Medical History  Diagnosis Date  . Hypertension   . Alcoholism ([place])   . History of pacemaker   . Anxiety   . Depression     ROS:   All systems reviewed and negative except as noted in the HPI.   Past Surgical History  Procedure Laterality Date  . Prostatectomy    . Appendectomy    . Pacemaker insertion  04/13/2014     Family History  Problem Relation Age of Onset  . Coronary artery disease    . Stroke       Social History   Social  History  . Marital Status: Married    Spouse Name: N/A  . Number of Children: N/A  . Years of Education: N/A   Occupational History  . Not on file.   Social History Main Topics  . Smoking status: Never Smoker   . Smokeless tobacco: Not on file  . Alcohol Use: Yes     Comment: 2-3 bottles of wine daily  . Drug Use: No  . Sexual Activity: Not on file   Other Topics Concern  . Not on file   Social History Narrative     BP 94/62 mmHg  Pulse 60  Ht 5\' 10"  (1.778 m)  Wt 213 lb 12.8 oz (96.979 kg)  BMI 30.68 kg/m2  Physical Exam:  Well appearing 70 yo man, NAD HEENT: Unremarkable Neck:  No JVD, no thyromegally Lymphatics:  No adenopathy Back:  No CVA tenderness Lungs:  Clear with no wheezes HEART:  Regular rate rhythm, 2/6 systolic murmurs, no rubs, no clicks, S2 is reduced Abd:  soft, positive bowel sounds, no organomegally, no rebound, no guarding Ext:  2 plus pulses, no edema, no cyanosis, no clubbing Skin:  No rashes no nodules Neuro:  CN II through XII intact, motor grossly intact  EKG - reviewed, NSR   Assess/Plan:  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 I have warned him of the warning symptoms but because he is asymptomatic. We will recommend watchful waiting. 2. 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 HTN - his blood pressure is well controlled. Will follow.  Mikle Bosworth.D.

## 2016-02-26 NOTE — Patient Instructions (Signed)
Medication Instructions:  Your physician recommends that you continue on your current medications as directed. Please refer to the Current Medication list given to you today.  Labwork: None ordered.  Testing/Procedures: None ordered.  Follow-Up: Your physician recommends that you schedule a follow-up appointment as needed.   Any Other Special Instructions Will Be Listed Below (If Applicable).     If you need a refill on your cardiac medications before your next appointment, please call your pharmacy.   

## 2016-03-02 ENCOUNTER — Institutional Professional Consult (permissible substitution): Payer: Self-pay | Admitting: Internal Medicine

## 2016-03-05 DIAGNOSIS — M797 Fibromyalgia: Secondary | ICD-10-CM | POA: Diagnosis not present

## 2016-03-05 DIAGNOSIS — I10 Essential (primary) hypertension: Secondary | ICD-10-CM | POA: Diagnosis not present

## 2016-03-05 DIAGNOSIS — I6529 Occlusion and stenosis of unspecified carotid artery: Secondary | ICD-10-CM | POA: Diagnosis not present

## 2016-03-05 DIAGNOSIS — I350 Nonrheumatic aortic (valve) stenosis: Secondary | ICD-10-CM | POA: Diagnosis not present

## 2016-03-05 DIAGNOSIS — G4700 Insomnia, unspecified: Secondary | ICD-10-CM | POA: Diagnosis not present

## 2016-03-05 DIAGNOSIS — Z87898 Personal history of other specified conditions: Secondary | ICD-10-CM | POA: Diagnosis not present

## 2016-03-05 DIAGNOSIS — Z8546 Personal history of malignant neoplasm of prostate: Secondary | ICD-10-CM | POA: Diagnosis not present

## 2016-03-29 DIAGNOSIS — I472 Ventricular tachycardia: Secondary | ICD-10-CM | POA: Diagnosis not present

## 2016-05-10 DIAGNOSIS — Z950 Presence of cardiac pacemaker: Secondary | ICD-10-CM | POA: Diagnosis not present

## 2016-06-10 DIAGNOSIS — I10 Essential (primary) hypertension: Secondary | ICD-10-CM | POA: Diagnosis not present

## 2016-06-10 DIAGNOSIS — E7800 Pure hypercholesterolemia, unspecified: Secondary | ICD-10-CM | POA: Diagnosis not present

## 2016-06-10 DIAGNOSIS — Z950 Presence of cardiac pacemaker: Secondary | ICD-10-CM | POA: Diagnosis not present

## 2016-06-10 DIAGNOSIS — I350 Nonrheumatic aortic (valve) stenosis: Secondary | ICD-10-CM | POA: Diagnosis not present

## 2016-06-29 DIAGNOSIS — H2513 Age-related nuclear cataract, bilateral: Secondary | ICD-10-CM | POA: Diagnosis not present

## 2016-08-04 DIAGNOSIS — Z79899 Other long term (current) drug therapy: Secondary | ICD-10-CM | POA: Diagnosis not present

## 2016-08-04 DIAGNOSIS — Z8546 Personal history of malignant neoplasm of prostate: Secondary | ICD-10-CM | POA: Diagnosis not present

## 2016-08-04 DIAGNOSIS — E7800 Pure hypercholesterolemia, unspecified: Secondary | ICD-10-CM | POA: Diagnosis not present

## 2016-08-10 DIAGNOSIS — Z950 Presence of cardiac pacemaker: Secondary | ICD-10-CM | POA: Diagnosis not present

## 2016-09-06 DIAGNOSIS — M797 Fibromyalgia: Secondary | ICD-10-CM | POA: Diagnosis not present

## 2016-09-06 DIAGNOSIS — I10 Essential (primary) hypertension: Secondary | ICD-10-CM | POA: Diagnosis not present

## 2016-09-06 DIAGNOSIS — Z23 Encounter for immunization: Secondary | ICD-10-CM | POA: Diagnosis not present

## 2016-09-06 DIAGNOSIS — Z8546 Personal history of malignant neoplasm of prostate: Secondary | ICD-10-CM | POA: Diagnosis not present

## 2016-09-06 DIAGNOSIS — G4700 Insomnia, unspecified: Secondary | ICD-10-CM | POA: Diagnosis not present

## 2016-11-09 DIAGNOSIS — Z950 Presence of cardiac pacemaker: Secondary | ICD-10-CM | POA: Diagnosis not present

## 2016-11-29 DIAGNOSIS — I10 Essential (primary) hypertension: Secondary | ICD-10-CM | POA: Diagnosis not present

## 2016-12-01 DIAGNOSIS — I350 Nonrheumatic aortic (valve) stenosis: Secondary | ICD-10-CM | POA: Diagnosis not present

## 2016-12-06 DIAGNOSIS — I10 Essential (primary) hypertension: Secondary | ICD-10-CM | POA: Diagnosis not present

## 2016-12-06 DIAGNOSIS — Z950 Presence of cardiac pacemaker: Secondary | ICD-10-CM | POA: Diagnosis not present

## 2016-12-06 DIAGNOSIS — E7800 Pure hypercholesterolemia, unspecified: Secondary | ICD-10-CM | POA: Diagnosis not present

## 2016-12-06 DIAGNOSIS — I350 Nonrheumatic aortic (valve) stenosis: Secondary | ICD-10-CM | POA: Diagnosis not present

## 2017-02-08 DIAGNOSIS — Z950 Presence of cardiac pacemaker: Secondary | ICD-10-CM | POA: Diagnosis not present

## 2017-05-10 DIAGNOSIS — Z45010 Encounter for checking and testing of cardiac pacemaker pulse generator [battery]: Secondary | ICD-10-CM | POA: Diagnosis not present

## 2017-05-10 DIAGNOSIS — Z950 Presence of cardiac pacemaker: Secondary | ICD-10-CM | POA: Diagnosis not present

## 2017-05-14 DIAGNOSIS — I350 Nonrheumatic aortic (valve) stenosis: Secondary | ICD-10-CM | POA: Diagnosis not present

## 2017-05-14 DIAGNOSIS — Z950 Presence of cardiac pacemaker: Secondary | ICD-10-CM | POA: Diagnosis not present

## 2017-05-14 DIAGNOSIS — Z45010 Encounter for checking and testing of cardiac pacemaker pulse generator [battery]: Secondary | ICD-10-CM | POA: Diagnosis not present

## 2017-05-14 DIAGNOSIS — I10 Essential (primary) hypertension: Secondary | ICD-10-CM | POA: Diagnosis not present

## 2017-06-09 DIAGNOSIS — I350 Nonrheumatic aortic (valve) stenosis: Secondary | ICD-10-CM | POA: Diagnosis not present

## 2017-06-14 DIAGNOSIS — I350 Nonrheumatic aortic (valve) stenosis: Secondary | ICD-10-CM | POA: Diagnosis not present

## 2017-06-14 DIAGNOSIS — I10 Essential (primary) hypertension: Secondary | ICD-10-CM | POA: Diagnosis not present

## 2017-06-14 DIAGNOSIS — Z950 Presence of cardiac pacemaker: Secondary | ICD-10-CM | POA: Diagnosis not present

## 2017-06-14 DIAGNOSIS — E7800 Pure hypercholesterolemia, unspecified: Secondary | ICD-10-CM | POA: Diagnosis not present

## 2017-06-14 DIAGNOSIS — Z45010 Encounter for checking and testing of cardiac pacemaker pulse generator [battery]: Secondary | ICD-10-CM | POA: Diagnosis not present

## 2017-07-06 DIAGNOSIS — H353121 Nonexudative age-related macular degeneration, left eye, early dry stage: Secondary | ICD-10-CM | POA: Diagnosis not present

## 2017-07-06 DIAGNOSIS — H25813 Combined forms of age-related cataract, bilateral: Secondary | ICD-10-CM | POA: Diagnosis not present

## 2017-07-06 DIAGNOSIS — H01001 Unspecified blepharitis right upper eyelid: Secondary | ICD-10-CM | POA: Diagnosis not present

## 2017-07-06 DIAGNOSIS — H524 Presbyopia: Secondary | ICD-10-CM | POA: Diagnosis not present

## 2017-07-12 DIAGNOSIS — H268 Other specified cataract: Secondary | ICD-10-CM | POA: Diagnosis not present

## 2017-07-12 DIAGNOSIS — H25811 Combined forms of age-related cataract, right eye: Secondary | ICD-10-CM | POA: Diagnosis not present

## 2017-07-19 DIAGNOSIS — E7800 Pure hypercholesterolemia, unspecified: Secondary | ICD-10-CM | POA: Diagnosis not present

## 2017-07-19 DIAGNOSIS — I10 Essential (primary) hypertension: Secondary | ICD-10-CM | POA: Diagnosis not present

## 2017-08-09 DIAGNOSIS — R001 Bradycardia, unspecified: Secondary | ICD-10-CM | POA: Diagnosis not present

## 2017-08-09 DIAGNOSIS — Z950 Presence of cardiac pacemaker: Secondary | ICD-10-CM | POA: Diagnosis not present

## 2017-08-09 DIAGNOSIS — Z45018 Encounter for adjustment and management of other part of cardiac pacemaker: Secondary | ICD-10-CM | POA: Diagnosis not present

## 2017-08-12 DIAGNOSIS — I10 Essential (primary) hypertension: Secondary | ICD-10-CM | POA: Diagnosis not present

## 2017-08-12 DIAGNOSIS — M797 Fibromyalgia: Secondary | ICD-10-CM | POA: Diagnosis not present

## 2017-08-12 DIAGNOSIS — R42 Dizziness and giddiness: Secondary | ICD-10-CM | POA: Diagnosis not present

## 2017-08-12 DIAGNOSIS — I350 Nonrheumatic aortic (valve) stenosis: Secondary | ICD-10-CM | POA: Diagnosis not present

## 2017-08-12 DIAGNOSIS — I951 Orthostatic hypotension: Secondary | ICD-10-CM | POA: Diagnosis not present

## 2017-08-14 DIAGNOSIS — I350 Nonrheumatic aortic (valve) stenosis: Secondary | ICD-10-CM

## 2017-08-14 HISTORY — DX: Nonrheumatic aortic (valve) stenosis: I35.0
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
## 2017-08-18 ENCOUNTER — Encounter (HOSPITAL_COMMUNITY)
Admission: RE | Disposition: A | Discharge status: Home / Self Care | Payer: Self-pay | Source: Ambulatory Visit | Attending: Cardiology

## 2017-08-18 ENCOUNTER — Ambulatory Visit (HOSPITAL_COMMUNITY)
Admission: RE | Admit: 2017-08-18 | Discharge: 2017-08-18 | Disposition: A | Discharge status: Home / Self Care | Payer: Medicare Other | Source: Ambulatory Visit | Attending: Cardiology | Admitting: Cardiology

## 2017-08-18 ENCOUNTER — Encounter (HOSPITAL_COMMUNITY): Payer: Self-pay | Admitting: *Deleted

## 2017-08-18 DIAGNOSIS — E785 Hyperlipidemia, unspecified: Secondary | ICD-10-CM | POA: Insufficient documentation

## 2017-08-18 DIAGNOSIS — Z8546 Personal history of malignant neoplasm of prostate: Secondary | ICD-10-CM | POA: Diagnosis not present

## 2017-08-18 DIAGNOSIS — I951 Orthostatic hypotension: Secondary | ICD-10-CM | POA: Diagnosis not present

## 2017-08-18 DIAGNOSIS — F419 Anxiety disorder, unspecified: Secondary | ICD-10-CM | POA: Diagnosis not present

## 2017-08-18 DIAGNOSIS — Z950 Presence of cardiac pacemaker: Secondary | ICD-10-CM | POA: Diagnosis not present

## 2017-08-18 DIAGNOSIS — I350 Nonrheumatic aortic (valve) stenosis: Secondary | ICD-10-CM | POA: Diagnosis not present

## 2017-08-18 DIAGNOSIS — F1020 Alcohol dependence, uncomplicated: Secondary | ICD-10-CM | POA: Insufficient documentation

## 2017-08-18 DIAGNOSIS — F329 Major depressive disorder, single episode, unspecified: Secondary | ICD-10-CM | POA: Insufficient documentation

## 2017-08-18 DIAGNOSIS — M797 Fibromyalgia: Secondary | ICD-10-CM | POA: Diagnosis not present

## 2017-08-18 DIAGNOSIS — I272 Pulmonary hypertension, unspecified: Secondary | ICD-10-CM | POA: Insufficient documentation

## 2017-08-18 DIAGNOSIS — I10 Essential (primary) hypertension: Secondary | ICD-10-CM | POA: Insufficient documentation

## 2017-08-18 DIAGNOSIS — Z8674 Personal history of sudden cardiac arrest: Secondary | ICD-10-CM | POA: Insufficient documentation

## 2017-08-18 DIAGNOSIS — Z8673 Personal history of transient ischemic attack (TIA), and cerebral infarction without residual deficits: Secondary | ICD-10-CM | POA: Diagnosis not present

## 2017-08-18 HISTORY — PX: TEE WITHOUT CARDIOVERSION: SHX5443

## 2017-08-18 SURGERY — ECHOCARDIOGRAM, TRANSESOPHAGEAL
Anesthesia: Moderate Sedation

## 2017-08-18 MED ORDER — SODIUM CHLORIDE 0.9 % IV SOLN: INTRAVENOUS | Status: DC

## 2017-08-18 MED ORDER — FENTANYL CITRATE (PF) 100 MCG/2ML IJ SOLN
INTRAMUSCULAR | Status: AC
  Filled 2017-08-18: qty 2

## 2017-08-18 MED ORDER — MIDAZOLAM HCL 5 MG/ML IJ SOLN
INTRAMUSCULAR | Status: AC
  Filled 2017-08-18: qty 2

## 2017-08-18 MED ORDER — FENTANYL CITRATE (PF) 100 MCG/2ML IJ SOLN
INTRAMUSCULAR | Status: DC | PRN
  Administered 2017-08-18 (×2): 25 ug via INTRAVENOUS

## 2017-08-18 MED ORDER — DIPHENHYDRAMINE HCL 50 MG/ML IJ SOLN
INTRAMUSCULAR | Status: AC
  Filled 2017-08-18: qty 1

## 2017-08-18 MED ORDER — MIDAZOLAM HCL 10 MG/2ML IJ SOLN
INTRAMUSCULAR | Status: DC | PRN
  Administered 2017-08-18 (×3): 1 mg via INTRAVENOUS

## 2017-08-18 MED ORDER — BUTAMBEN-TETRACAINE-BENZOCAINE 2-2-14 % EX AERO
INHALATION_SPRAY | CUTANEOUS | Status: DC | PRN
  Administered 2017-08-18: 2 via TOPICAL

## 2017-08-18 NOTE — Interval H&P Note (Signed)
History and Physical Interval Note:  08/18/2017 8:11 AM  Manuel Escobar  has presented today for surgery, with the diagnosis of AORTIC STENOSIS  The various methods of treatment have been discussed with the patient and family. After consideration of risks, benefits and other options for treatment, the patient has consented to  Procedure(s): TRANSESOPHAGEAL ECHOCARDIOGRAM (TEE) (N/A) as a surgical intervention .  The patient's history has been reviewed, patient examined, no change in status, stable for surgery.  I have reviewed the patient's chart and labs.  Questions were answered to the patient's satisfaction.     [place]

## 2017-08-18 NOTE — Progress Notes (Signed)
Echocardiogram Transesophageal has been performed.  Manuel Escobar 08/18/2017, 9:15 AM

## 2017-08-18 NOTE — CV Procedure (Addendum)
TEE: Under moderate sedation, TEE was performed without complications: LV: Normal. Normal EF. RV: Normal. Pacemaker lead seen. LA: Normal. Left atrial appendage: Normal without thrombus. Normal function. Inter atrial septum is intact without defect.  RA: Normal  MV: Normal Trace MR. TV: Normal Trace TR AV: Severe AS. Vmax 5.4 m/sec. Mean PG 74 mmHg. AVA 0.7 cm2. Mild AI. PV: Normal. Trace PI.  Moderate aortic arch and ascending aorta atherosclerosis. Fixed calcific plaque.   Conscious sedation protocol was followed, I personally administered conscious sedation and monitored the patient. Patient received 3 milligrams of Versed and 50 mcg fentanyl . Patient tolerated the procedure well and there was no complication from conscious sedation. Time administered was 28 min and procedure ended at 8:46 AM.  Nigel Mormon, MD [facility] Pager: [phone] Office: [phone] If no answer Cell [phone]

## 2017-08-18 NOTE — Discharge Instructions (Signed)

## 2017-08-18 NOTE — CV Procedure (Deleted)
TEE: Under moderate sedation, TEE was performed without complications: LV: Normal. Normal EF. RV: Normal LA: Normal. Left atrial appendage: Normal without thrombus. Normal function. Inter atrial septum is intact without defect.  RA: Normal  MV: Normal Trace MR. TV: Normal Trace TR AV: Severe AS. Vmax 5.4 m/sec. Mean PG 74 mmHg. AVA 0.7 cm2. PV: Normal. Trace PI.  Moderate aortic arch and ascending aorta atherosclerosis. Fixed calcific plaque.   Conscious sedation protocol was followed, I personally administered conscious sedation and monitored the patient. Patient received 3 milligrams of Versed and 50 mcg fentanyl . Patient tolerated the procedure well and there was no complication from conscious sedation. Time administered was 28 min and procedure ended at 8:46 AM.

## 2017-08-19 ENCOUNTER — Encounter (HOSPITAL_COMMUNITY): Payer: Self-pay | Admitting: Cardiology

## 2017-08-25 DIAGNOSIS — I10 Essential (primary) hypertension: Secondary | ICD-10-CM | POA: Diagnosis not present

## 2017-08-25 DIAGNOSIS — I350 Nonrheumatic aortic (valve) stenosis: Secondary | ICD-10-CM | POA: Diagnosis not present

## 2017-08-25 DIAGNOSIS — I951 Orthostatic hypotension: Secondary | ICD-10-CM | POA: Diagnosis not present

## 2017-08-25 DIAGNOSIS — M797 Fibromyalgia: Secondary | ICD-10-CM | POA: Diagnosis not present

## 2017-08-28 NOTE — Progress Notes (Signed)
Labs 08/25/2017: H/H 13.4/41.0 MCV 90. Platelets 190. BUN/creatinine 7/0.94. EGFR 81. Sodium 143, potassium 4.5. INR 1.1
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
## 2017-08-30 ENCOUNTER — Other Ambulatory Visit: Payer: Self-pay

## 2017-08-30 ENCOUNTER — Ambulatory Visit (HOSPITAL_COMMUNITY)
Admission: RE | Admit: 2017-08-30 | Discharge: 2017-08-30 | Disposition: A | Discharge status: Home / Self Care | Payer: Medicare Other | Source: Ambulatory Visit | Attending: Cardiology | Admitting: Cardiology

## 2017-08-30 ENCOUNTER — Encounter (HOSPITAL_COMMUNITY)
Admission: RE | Disposition: A | Discharge status: Home / Self Care | Payer: Self-pay | Source: Ambulatory Visit | Attending: Cardiology

## 2017-08-30 DIAGNOSIS — Z8674 Personal history of sudden cardiac arrest: Secondary | ICD-10-CM | POA: Insufficient documentation

## 2017-08-30 DIAGNOSIS — I350 Nonrheumatic aortic (valve) stenosis: Secondary | ICD-10-CM | POA: Diagnosis not present

## 2017-08-30 DIAGNOSIS — Z950 Presence of cardiac pacemaker: Secondary | ICD-10-CM | POA: Diagnosis not present

## 2017-08-30 DIAGNOSIS — Z8546 Personal history of malignant neoplasm of prostate: Secondary | ICD-10-CM | POA: Diagnosis not present

## 2017-08-30 DIAGNOSIS — M797 Fibromyalgia: Secondary | ICD-10-CM | POA: Diagnosis not present

## 2017-08-30 DIAGNOSIS — E785 Hyperlipidemia, unspecified: Secondary | ICD-10-CM | POA: Insufficient documentation

## 2017-08-30 DIAGNOSIS — I2510 Atherosclerotic heart disease of native coronary artery without angina pectoris: Secondary | ICD-10-CM | POA: Insufficient documentation

## 2017-08-30 DIAGNOSIS — Z79899 Other long term (current) drug therapy: Secondary | ICD-10-CM | POA: Insufficient documentation

## 2017-08-30 DIAGNOSIS — I10 Essential (primary) hypertension: Secondary | ICD-10-CM | POA: Insufficient documentation

## 2017-08-30 HISTORY — PX: CORONARY PRESSURE/FFR STUDY: CATH118243

## 2017-08-30 HISTORY — PX: RIGHT/LEFT HEART CATH AND CORONARY ANGIOGRAPHY: CATH118266

## 2017-08-30 HISTORY — PX: INTRAVASCULAR PRESSURE WIRE/FFR STUDY: CATH118243

## 2017-08-30 SURGERY — RIGHT/LEFT HEART CATH AND CORONARY ANGIOGRAPHY
Anesthesia: LOCAL

## 2017-08-30 MED ORDER — ADENOSINE (DIAGNOSTIC) 140MCG/KG/MIN
INTRAVENOUS | Status: DC | PRN
  Administered 2017-08-30: 140 ug/kg/min via INTRAVENOUS

## 2017-08-30 MED ORDER — HEPARIN (PORCINE) IN NACL 2-0.9 UNIT/ML-% IJ SOLN
INTRAMUSCULAR | Status: AC | PRN
  Administered 2017-08-30: 1000 mL

## 2017-08-30 MED ORDER — FENTANYL CITRATE (PF) 100 MCG/2ML IJ SOLN
INTRAMUSCULAR | Status: AC
  Filled 2017-08-30: qty 2

## 2017-08-30 MED ORDER — SODIUM CHLORIDE 0.9 % IV SOLN: 250.0000 mL | INTRAVENOUS | Status: DC | PRN

## 2017-08-30 MED ORDER — IOPAMIDOL (ISOVUE-370) INJECTION 76%
INTRAVENOUS | Status: DC | PRN
  Administered 2017-08-30: 75 mL via INTRA_ARTERIAL

## 2017-08-30 MED ORDER — VERAPAMIL HCL 2.5 MG/ML IV SOLN
INTRAVENOUS | Status: AC
  Filled 2017-08-30: qty 2

## 2017-08-30 MED ORDER — FENTANYL CITRATE (PF) 100 MCG/2ML IJ SOLN
INTRAMUSCULAR | Status: DC | PRN
  Administered 2017-08-30 (×2): 12.5 ug via INTRAVENOUS

## 2017-08-30 MED ORDER — ASPIRIN 81 MG PO CHEW
CHEWABLE_TABLET | ORAL | Status: AC
  Administered 2017-08-30: 81 mg via ORAL
  Filled 2017-08-30: qty 1

## 2017-08-30 MED ORDER — SODIUM CHLORIDE 0.9% FLUSH: 3.0000 mL | Freq: Two times a day (BID) | INTRAVENOUS | Status: DC

## 2017-08-30 MED ORDER — NITROGLYCERIN 1 MG/10 ML FOR IR/CATH LAB
INTRA_ARTERIAL | Status: AC
  Filled 2017-08-30: qty 10

## 2017-08-30 MED ORDER — ADENOSINE 12 MG/4ML IV SOLN
INTRAVENOUS | Status: AC
  Filled 2017-08-30: qty 16

## 2017-08-30 MED ORDER — HEPARIN SODIUM (PORCINE) 1000 UNIT/ML IJ SOLN
INTRAMUSCULAR | Status: DC | PRN
  Administered 2017-08-30 (×2): 2000 [IU] via INTRAVENOUS
  Administered 2017-08-30: 3000 [IU] via INTRAVENOUS
  Administered 2017-08-30: 4000 [IU] via INTRAVENOUS

## 2017-08-30 MED ORDER — LIDOCAINE HCL (PF) 1 % IJ SOLN
INTRAMUSCULAR | Status: AC
  Filled 2017-08-30: qty 30

## 2017-08-30 MED ORDER — ACETAMINOPHEN 325 MG PO TABS: 650.0000 mg | ORAL_TABLET | ORAL | Status: DC | PRN

## 2017-08-30 MED ORDER — ONDANSETRON HCL 4 MG/2ML IJ SOLN: 4.0000 mg | Freq: Four times a day (QID) | INTRAMUSCULAR | Status: DC | PRN

## 2017-08-30 MED ORDER — SODIUM CHLORIDE 0.9 % IV SOLN
INTRAVENOUS | Status: AC
  Administered 2017-08-30: 14:00:00 via INTRAVENOUS

## 2017-08-30 MED ORDER — MIDAZOLAM HCL 2 MG/2ML IJ SOLN
INTRAMUSCULAR | Status: DC | PRN
  Administered 2017-08-30 (×2): 0.5 mg via INTRAVENOUS

## 2017-08-30 MED ORDER — HEPARIN SODIUM (PORCINE) 1000 UNIT/ML IJ SOLN
INTRAMUSCULAR | Status: AC
  Filled 2017-08-30: qty 1

## 2017-08-30 MED ORDER — LIDOCAINE HCL (PF) 1 % IJ SOLN
INTRAMUSCULAR | Status: DC | PRN
  Administered 2017-08-30 (×2): 2 mL

## 2017-08-30 MED ORDER — VERAPAMIL HCL 2.5 MG/ML IV SOLN
INTRA_ARTERIAL | Status: DC | PRN
  Administered 2017-08-30: 5 mL via INTRA_ARTERIAL
  Administered 2017-08-30: 7 mL via INTRA_ARTERIAL

## 2017-08-30 MED ORDER — ASPIRIN 81 MG PO CHEW
81.0000 mg | CHEWABLE_TABLET | ORAL | Status: AC
  Administered 2017-08-30: 81 mg via ORAL

## 2017-08-30 MED ORDER — HEPARIN (PORCINE) IN NACL 2-0.9 UNIT/ML-% IJ SOLN
INTRAMUSCULAR | Status: AC
  Filled 2017-08-30: qty 1000

## 2017-08-30 MED ORDER — SODIUM CHLORIDE 0.9% FLUSH: 3.0000 mL | INTRAVENOUS | Status: DC | PRN

## 2017-08-30 MED ORDER — SODIUM CHLORIDE 0.9 % IV SOLN: INTRAVENOUS | Status: AC

## 2017-08-30 MED ORDER — IOPAMIDOL (ISOVUE-370) INJECTION 76%
INTRAVENOUS | Status: AC
  Filled 2017-08-30: qty 100

## 2017-08-30 MED ORDER — MIDAZOLAM HCL 2 MG/2ML IJ SOLN
INTRAMUSCULAR | Status: AC
  Filled 2017-08-30: qty 2

## 2017-08-30 SURGICAL SUPPLY — 15 items
CATH BALLN WEDGE 5F 110CM (CATHETERS) ×2 IMPLANT
CATH EXPO 5F FL3.5 (CATHETERS) ×2 IMPLANT
CATH INFINITI JR4 5F (CATHETERS) ×2 IMPLANT
CATH LAUNCHER 6FR EBU3.5 (CATHETERS) ×2 IMPLANT
DEVICE RAD COMP TR BAND LRG (VASCULAR PRODUCTS) ×2 IMPLANT
GLIDESHEATH SLEND A-KIT 6F 22G (SHEATH) ×2 IMPLANT
GUIDEWIRE INQWIRE 1.5J.035X260 (WIRE) ×1 IMPLANT
GUIDEWIRE PRESSURE COMET II (WIRE) ×2 IMPLANT
INQWIRE 1.5J .035X260CM (WIRE) ×2
KIT HEART LEFT (KITS) ×2 IMPLANT
PACK CARDIAC CATHETERIZATION (CUSTOM PROCEDURE TRAY) ×2 IMPLANT
SHEATH GLIDE SLENDER 4/5FR (SHEATH) ×2 IMPLANT
TRANSDUCER W/STOPCOCK (MISCELLANEOUS) ×2 IMPLANT
TUBING CIL FLEX 10 FLL-RA (TUBING) ×2 IMPLANT
VALVE GUARDIAN II ~~LOC~~ HEMO (MISCELLANEOUS) ×2 IMPLANT

## 2017-08-30 NOTE — Discharge Instructions (Signed)

## 2017-08-30 NOTE — Progress Notes (Signed)
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  Nigel Mormon, MD [facility] Pager: [phone] Office: [phone] If no answer Cell [phone]

## 2017-08-30 NOTE — Interval H&P Note (Signed)
History and Physical Interval Note:  08/30/2017 2:37 PM  Manuel Escobar  has presented today for surgery, with the diagnosis of arotic stenosis  The various methods of treatment have been discussed with the patient and family. After consideration of risks, benefits and other options for treatment, the patient has consented to  Procedure(s): RIGHT/LEFT HEART CATH AND CORONARY ANGIOGRAPHY (N/A) as a surgical intervention .  The patient's history has been reviewed, patient examined, no change in status, stable for surgery.  I have reviewed the patient's chart and labs.  Questions were answered to the patient's satisfaction.    Indication:  1. Preoperative assessment before valvular surgery A (7) Indication: 70; Score 7       Manuel Escobar Manuel Escobar

## 2017-08-31 ENCOUNTER — Encounter (HOSPITAL_COMMUNITY): Payer: Self-pay | Admitting: Cardiology

## 2017-08-31 LAB — POCT I-STAT 3, VENOUS BLOOD GAS (G3P V)
ACID-BASE EXCESS: 1 mmol/L (ref 0.0–2.0)
Bicarbonate: 27.6 mmol/L (ref 20.0–28.0)
O2 SAT: 70 %
PCO2 VEN: 50.8 mmHg (ref 44.0–60.0)
TCO2: 29 mmol/L (ref 22–32)
pH, Ven: 7.343 (ref 7.250–7.430)
pO2, Ven: 39 mmHg (ref 32.0–45.0)

## 2017-08-31 LAB — POCT I-STAT 3, ART BLOOD GAS (G3+)
Bicarbonate: 25 mmol/L (ref 20.0–28.0)
O2 SAT: 98 %
PCO2 ART: 42.5 mmHg (ref 32.0–48.0)
TCO2: 26 mmol/L (ref 22–32)
pH, Arterial: 7.378 (ref 7.350–7.450)
pO2, Arterial: 118 mmHg — ABNORMAL HIGH (ref 83.0–108.0)

## 2017-08-31 LAB — POCT ACTIVATED CLOTTING TIME
ACTIVATED CLOTTING TIME: 252 s
ACTIVATED CLOTTING TIME: 246 s

## 2017-09-06 ENCOUNTER — Other Ambulatory Visit: Payer: Self-pay

## 2017-09-06 ENCOUNTER — Institutional Professional Consult (permissible substitution) (INDEPENDENT_AMBULATORY_CARE_PROVIDER_SITE_OTHER): Payer: Medicare Other | Admitting: Surgery

## 2017-09-06 ENCOUNTER — Encounter: Payer: Self-pay | Admitting: Surgery

## 2017-09-06 VITALS — BP 127/85 | HR 81 | Ht 70.0 in | Wt 207.0 lb

## 2017-09-06 DIAGNOSIS — I350 Nonrheumatic aortic (valve) stenosis: Secondary | ICD-10-CM

## 2017-09-06 MED ORDER — DIAZEPAM 10 MG PO TABS: 10.0000 mg | ORAL_TABLET | Freq: Four times a day (QID) | ORAL | 0 refills | Status: DC | PRN

## 2017-09-07 ENCOUNTER — Other Ambulatory Visit: Payer: Self-pay

## 2017-09-07 DIAGNOSIS — I350 Nonrheumatic aortic (valve) stenosis: Secondary | ICD-10-CM

## 2017-09-11 ENCOUNTER — Encounter: Payer: Self-pay | Admitting: Surgery
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
## 2017-09-12 ENCOUNTER — Telehealth: Payer: Self-pay

## 2017-09-12 NOTE — Telephone Encounter (Signed)
The pt was scheduled for Cardiac CT and CTA c/a/p on 12/4 to further evaluate Aortic Stenosis and surgical approach.  The pt contacted me and asked that I cancel these tests because he has decided to transition his care to [facility].  Test cancelled per pt's request.

## 2017-09-13 ENCOUNTER — Ambulatory Visit (HOSPITAL_COMMUNITY): Payer: Medicare Other

## 2017-09-15 ENCOUNTER — Encounter: Payer: Self-pay | Admitting: Cardiology

## 2017-09-15 DIAGNOSIS — Z952 Presence of prosthetic heart valve: Secondary | ICD-10-CM | POA: Diagnosis not present

## 2017-09-15 DIAGNOSIS — I350 Nonrheumatic aortic (valve) stenosis: Secondary | ICD-10-CM | POA: Diagnosis not present

## 2017-09-15 DIAGNOSIS — I771 Stricture of artery: Secondary | ICD-10-CM | POA: Diagnosis not present

## 2017-09-16 DIAGNOSIS — I350 Nonrheumatic aortic (valve) stenosis: Secondary | ICD-10-CM | POA: Diagnosis not present

## 2017-09-16 DIAGNOSIS — I2510 Atherosclerotic heart disease of native coronary artery without angina pectoris: Secondary | ICD-10-CM | POA: Diagnosis not present

## 2017-09-21 ENCOUNTER — Ambulatory Visit (HOSPITAL_COMMUNITY): Payer: Medicare Other

## 2017-09-23 DIAGNOSIS — I350 Nonrheumatic aortic (valve) stenosis: Secondary | ICD-10-CM | POA: Diagnosis not present

## 2017-09-28 DIAGNOSIS — R0989 Other specified symptoms and signs involving the circulatory and respiratory systems: Secondary | ICD-10-CM | POA: Diagnosis not present

## 2017-09-28 DIAGNOSIS — Z8546 Personal history of malignant neoplasm of prostate: Secondary | ICD-10-CM | POA: Diagnosis not present

## 2017-09-28 DIAGNOSIS — E785 Hyperlipidemia, unspecified: Secondary | ICD-10-CM | POA: Diagnosis present

## 2017-09-28 DIAGNOSIS — G8918 Other acute postprocedural pain: Secondary | ICD-10-CM | POA: Diagnosis not present

## 2017-09-28 DIAGNOSIS — R918 Other nonspecific abnormal finding of lung field: Secondary | ICD-10-CM | POA: Diagnosis not present

## 2017-09-28 DIAGNOSIS — I358 Other nonrheumatic aortic valve disorders: Secondary | ICD-10-CM | POA: Diagnosis not present

## 2017-09-28 DIAGNOSIS — I10 Essential (primary) hypertension: Secondary | ICD-10-CM | POA: Diagnosis not present

## 2017-09-28 DIAGNOSIS — Z952 Presence of prosthetic heart valve: Secondary | ICD-10-CM | POA: Diagnosis not present

## 2017-09-28 DIAGNOSIS — Z950 Presence of cardiac pacemaker: Secondary | ICD-10-CM | POA: Diagnosis not present

## 2017-09-28 DIAGNOSIS — J90 Pleural effusion, not elsewhere classified: Secondary | ICD-10-CM | POA: Diagnosis not present

## 2017-09-28 DIAGNOSIS — Z8674 Personal history of sudden cardiac arrest: Secondary | ICD-10-CM | POA: Diagnosis not present

## 2017-09-28 DIAGNOSIS — F418 Other specified anxiety disorders: Secondary | ICD-10-CM | POA: Diagnosis not present

## 2017-09-28 DIAGNOSIS — Z452 Encounter for adjustment and management of vascular access device: Secondary | ICD-10-CM | POA: Diagnosis not present

## 2017-09-28 DIAGNOSIS — Z953 Presence of xenogenic heart valve: Secondary | ICD-10-CM | POA: Diagnosis not present

## 2017-09-28 DIAGNOSIS — R5381 Other malaise: Secondary | ICD-10-CM | POA: Diagnosis not present

## 2017-09-28 DIAGNOSIS — J811 Chronic pulmonary edema: Secondary | ICD-10-CM | POA: Diagnosis not present

## 2017-09-28 DIAGNOSIS — I350 Nonrheumatic aortic (valve) stenosis: Secondary | ICD-10-CM | POA: Diagnosis not present

## 2017-09-28 DIAGNOSIS — I2510 Atherosclerotic heart disease of native coronary artery without angina pectoris: Secondary | ICD-10-CM | POA: Diagnosis not present

## 2017-09-28 DIAGNOSIS — I517 Cardiomegaly: Secondary | ICD-10-CM | POA: Diagnosis not present

## 2017-09-28 DIAGNOSIS — Z23 Encounter for immunization: Secondary | ICD-10-CM | POA: Diagnosis not present

## 2017-09-28 DIAGNOSIS — F329 Major depressive disorder, single episode, unspecified: Secondary | ICD-10-CM | POA: Diagnosis present

## 2017-09-28 DIAGNOSIS — I495 Sick sinus syndrome: Secondary | ICD-10-CM | POA: Diagnosis present

## 2017-09-28 DIAGNOSIS — R0689 Other abnormalities of breathing: Secondary | ICD-10-CM | POA: Diagnosis not present

## 2017-09-28 DIAGNOSIS — G8929 Other chronic pain: Secondary | ICD-10-CM | POA: Diagnosis present

## 2017-09-28 DIAGNOSIS — J9811 Atelectasis: Secondary | ICD-10-CM | POA: Diagnosis not present

## 2017-09-28 DIAGNOSIS — Z79899 Other long term (current) drug therapy: Secondary | ICD-10-CM | POA: Diagnosis not present

## 2017-09-28 DIAGNOSIS — F419 Anxiety disorder, unspecified: Secondary | ICD-10-CM | POA: Diagnosis present

## 2017-09-28 DIAGNOSIS — M797 Fibromyalgia: Secondary | ICD-10-CM | POA: Diagnosis present

## 2017-09-28 DIAGNOSIS — Z4682 Encounter for fitting and adjustment of non-vascular catheter: Secondary | ICD-10-CM | POA: Diagnosis not present

## 2017-09-29 DIAGNOSIS — Z952 Presence of prosthetic heart valve: Secondary | ICD-10-CM | POA: Insufficient documentation

## 2017-10-17 DIAGNOSIS — Z48812 Encounter for surgical aftercare following surgery on the circulatory system: Secondary | ICD-10-CM | POA: Diagnosis not present

## 2017-10-17 DIAGNOSIS — Z952 Presence of prosthetic heart valve: Secondary | ICD-10-CM | POA: Diagnosis not present

## 2017-10-17 DIAGNOSIS — R9431 Abnormal electrocardiogram [ECG] [EKG]: Secondary | ICD-10-CM | POA: Diagnosis not present

## 2017-10-17 DIAGNOSIS — I350 Nonrheumatic aortic (valve) stenosis: Secondary | ICD-10-CM | POA: Diagnosis not present

## 2017-10-17 DIAGNOSIS — Z950 Presence of cardiac pacemaker: Secondary | ICD-10-CM | POA: Diagnosis not present

## 2017-11-03 DIAGNOSIS — Z953 Presence of xenogenic heart valve: Secondary | ICD-10-CM | POA: Diagnosis not present

## 2017-11-03 DIAGNOSIS — I10 Essential (primary) hypertension: Secondary | ICD-10-CM | POA: Diagnosis not present

## 2017-11-03 DIAGNOSIS — Z950 Presence of cardiac pacemaker: Secondary | ICD-10-CM | POA: Diagnosis not present

## 2017-11-08 DIAGNOSIS — Z950 Presence of cardiac pacemaker: Secondary | ICD-10-CM | POA: Diagnosis not present

## 2017-11-08 DIAGNOSIS — R001 Bradycardia, unspecified: Secondary | ICD-10-CM | POA: Diagnosis not present

## 2017-11-08 DIAGNOSIS — Z45018 Encounter for adjustment and management of other part of cardiac pacemaker: Secondary | ICD-10-CM | POA: Diagnosis not present

## 2017-11-16 DIAGNOSIS — Z09 Encounter for follow-up examination after completed treatment for conditions other than malignant neoplasm: Secondary | ICD-10-CM | POA: Diagnosis not present

## 2017-11-16 DIAGNOSIS — Z953 Presence of xenogenic heart valve: Secondary | ICD-10-CM | POA: Diagnosis not present

## 2017-12-06 ENCOUNTER — Ambulatory Visit: Payer: Medicare Other | Admitting: Family Medicine

## 2018-01-23 DIAGNOSIS — H353121 Nonexudative age-related macular degeneration, left eye, early dry stage: Secondary | ICD-10-CM | POA: Diagnosis not present

## 2018-01-23 DIAGNOSIS — H25812 Combined forms of age-related cataract, left eye: Secondary | ICD-10-CM | POA: Diagnosis not present

## 2018-01-23 DIAGNOSIS — H0100A Unspecified blepharitis right eye, upper and lower eyelids: Secondary | ICD-10-CM | POA: Diagnosis not present

## 2018-01-23 DIAGNOSIS — H52203 Unspecified astigmatism, bilateral: Secondary | ICD-10-CM | POA: Diagnosis not present

## 2018-02-07 DIAGNOSIS — H21561 Pupillary abnormality, right eye: Secondary | ICD-10-CM | POA: Diagnosis not present

## 2018-02-07 DIAGNOSIS — H268 Other specified cataract: Secondary | ICD-10-CM | POA: Diagnosis not present

## 2018-02-07 DIAGNOSIS — H25812 Combined forms of age-related cataract, left eye: Secondary | ICD-10-CM | POA: Diagnosis not present

## 2018-02-07 DIAGNOSIS — Z950 Presence of cardiac pacemaker: Secondary | ICD-10-CM | POA: Diagnosis not present

## 2018-02-07 DIAGNOSIS — Z45018 Encounter for adjustment and management of other part of cardiac pacemaker: Secondary | ICD-10-CM | POA: Diagnosis not present

## 2018-02-23 DIAGNOSIS — Z45018 Encounter for adjustment and management of other part of cardiac pacemaker: Secondary | ICD-10-CM | POA: Diagnosis not present

## 2018-02-23 DIAGNOSIS — Z950 Presence of cardiac pacemaker: Secondary | ICD-10-CM | POA: Diagnosis not present

## 2018-05-09 DIAGNOSIS — Z950 Presence of cardiac pacemaker: Secondary | ICD-10-CM | POA: Diagnosis not present

## 2018-05-09 DIAGNOSIS — Z45018 Encounter for adjustment and management of other part of cardiac pacemaker: Secondary | ICD-10-CM | POA: Diagnosis not present

## 2018-05-09 DIAGNOSIS — I495 Sick sinus syndrome: Secondary | ICD-10-CM | POA: Diagnosis not present

## 2018-05-10 DIAGNOSIS — I10 Essential (primary) hypertension: Secondary | ICD-10-CM | POA: Diagnosis not present

## 2018-05-10 DIAGNOSIS — Z953 Presence of xenogenic heart valve: Secondary | ICD-10-CM | POA: Diagnosis not present

## 2018-05-10 DIAGNOSIS — Z950 Presence of cardiac pacemaker: Secondary | ICD-10-CM | POA: Diagnosis not present

## 2018-05-10 DIAGNOSIS — M549 Dorsalgia, unspecified: Secondary | ICD-10-CM | POA: Diagnosis not present

## 2018-06-06 DIAGNOSIS — I10 Essential (primary) hypertension: Secondary | ICD-10-CM | POA: Diagnosis not present

## 2018-06-13 DIAGNOSIS — I10 Essential (primary) hypertension: Secondary | ICD-10-CM | POA: Diagnosis not present

## 2018-06-14 DIAGNOSIS — I10 Essential (primary) hypertension: Secondary | ICD-10-CM | POA: Diagnosis not present

## 2018-06-14 DIAGNOSIS — R5383 Other fatigue: Secondary | ICD-10-CM | POA: Diagnosis not present

## 2018-06-14 DIAGNOSIS — I951 Orthostatic hypotension: Secondary | ICD-10-CM | POA: Diagnosis not present

## 2018-06-14 DIAGNOSIS — R5381 Other malaise: Secondary | ICD-10-CM | POA: Diagnosis not present

## 2018-06-15 DIAGNOSIS — Z23 Encounter for immunization: Secondary | ICD-10-CM | POA: Diagnosis not present

## 2018-08-08 DIAGNOSIS — Z950 Presence of cardiac pacemaker: Secondary | ICD-10-CM | POA: Diagnosis not present

## 2018-08-08 DIAGNOSIS — Z45018 Encounter for adjustment and management of other part of cardiac pacemaker: Secondary | ICD-10-CM | POA: Diagnosis not present

## 2018-08-08 DIAGNOSIS — I495 Sick sinus syndrome: Secondary | ICD-10-CM | POA: Diagnosis not present

## 2018-08-18 DIAGNOSIS — Z950 Presence of cardiac pacemaker: Secondary | ICD-10-CM | POA: Diagnosis not present

## 2018-08-18 DIAGNOSIS — I951 Orthostatic hypotension: Secondary | ICD-10-CM | POA: Diagnosis not present

## 2018-08-18 DIAGNOSIS — Z953 Presence of xenogenic heart valve: Secondary | ICD-10-CM | POA: Diagnosis not present

## 2018-08-18 DIAGNOSIS — I10 Essential (primary) hypertension: Secondary | ICD-10-CM | POA: Diagnosis not present

## 2018-10-02 DIAGNOSIS — H353121 Nonexudative age-related macular degeneration, left eye, early dry stage: Secondary | ICD-10-CM | POA: Diagnosis not present

## 2018-10-02 DIAGNOSIS — H0100A Unspecified blepharitis right eye, upper and lower eyelids: Secondary | ICD-10-CM | POA: Diagnosis not present

## 2018-10-02 DIAGNOSIS — H0100B Unspecified blepharitis left eye, upper and lower eyelids: Secondary | ICD-10-CM | POA: Diagnosis not present

## 2018-10-02 DIAGNOSIS — H26491 Other secondary cataract, right eye: Secondary | ICD-10-CM | POA: Diagnosis not present

## 2018-11-07 DIAGNOSIS — Z950 Presence of cardiac pacemaker: Secondary | ICD-10-CM | POA: Diagnosis not present

## 2018-11-07 DIAGNOSIS — I495 Sick sinus syndrome: Secondary | ICD-10-CM | POA: Diagnosis not present

## 2018-11-07 DIAGNOSIS — Z45018 Encounter for adjustment and management of other part of cardiac pacemaker: Secondary | ICD-10-CM | POA: Diagnosis not present

## 2018-12-19 DIAGNOSIS — E782 Mixed hyperlipidemia: Secondary | ICD-10-CM | POA: Diagnosis not present

## 2018-12-19 DIAGNOSIS — I495 Sick sinus syndrome: Secondary | ICD-10-CM | POA: Diagnosis not present

## 2018-12-19 DIAGNOSIS — Z1211 Encounter for screening for malignant neoplasm of colon: Secondary | ICD-10-CM | POA: Diagnosis not present

## 2018-12-19 DIAGNOSIS — Z86010 Personal history of colonic polyps: Secondary | ICD-10-CM | POA: Diagnosis not present

## 2018-12-19 DIAGNOSIS — C61 Malignant neoplasm of prostate: Secondary | ICD-10-CM | POA: Diagnosis not present

## 2018-12-19 DIAGNOSIS — I10 Essential (primary) hypertension: Secondary | ICD-10-CM | POA: Diagnosis not present

## 2018-12-19 DIAGNOSIS — Z79899 Other long term (current) drug therapy: Secondary | ICD-10-CM | POA: Diagnosis not present

## 2019-02-06 DIAGNOSIS — Z45018 Encounter for adjustment and management of other part of cardiac pacemaker: Secondary | ICD-10-CM | POA: Diagnosis not present

## 2019-02-06 DIAGNOSIS — Z950 Presence of cardiac pacemaker: Secondary | ICD-10-CM | POA: Diagnosis not present

## 2019-02-06 DIAGNOSIS — I495 Sick sinus syndrome: Secondary | ICD-10-CM | POA: Diagnosis not present

## 2019-02-13 DIAGNOSIS — H0012 Chalazion right lower eyelid: Secondary | ICD-10-CM | POA: Diagnosis not present

## 2019-02-13 DIAGNOSIS — H5711 Ocular pain, right eye: Secondary | ICD-10-CM | POA: Diagnosis not present

## 2019-02-23 ENCOUNTER — Ambulatory Visit: Payer: Self-pay

## 2019-04-04 ENCOUNTER — Other Ambulatory Visit: Payer: Self-pay | Admitting: Cardiology

## 2019-04-04 NOTE — Telephone Encounter (Signed)
Please fill

## 2019-04-28 ENCOUNTER — Encounter: Payer: Self-pay | Admitting: Cardiology

## 2019-04-28 DIAGNOSIS — I495 Sick sinus syndrome: Secondary | ICD-10-CM | POA: Insufficient documentation

## 2019-04-28 DIAGNOSIS — R55 Syncope and collapse: Secondary | ICD-10-CM

## 2019-04-28 DIAGNOSIS — Z45018 Encounter for adjustment and management of other part of cardiac pacemaker: Secondary | ICD-10-CM | POA: Insufficient documentation

## 2019-04-28 HISTORY — DX: Sick sinus syndrome: I49.5

## 2019-04-28 HISTORY — DX: Encounter for adjustment and management of other part of cardiac pacemaker: Z45.018

## 2019-04-28 HISTORY — DX: Syncope and collapse: R55

## 2019-05-08 DIAGNOSIS — Z45018 Encounter for adjustment and management of other part of cardiac pacemaker: Secondary | ICD-10-CM | POA: Diagnosis not present

## 2019-05-08 DIAGNOSIS — Z950 Presence of cardiac pacemaker: Secondary | ICD-10-CM | POA: Diagnosis not present

## 2019-05-08 DIAGNOSIS — I495 Sick sinus syndrome: Secondary | ICD-10-CM | POA: Diagnosis not present

## 2019-06-19 ENCOUNTER — Other Ambulatory Visit: Payer: Self-pay | Admitting: Cardiology

## 2019-06-20 DIAGNOSIS — Z1211 Encounter for screening for malignant neoplasm of colon: Secondary | ICD-10-CM | POA: Diagnosis not present

## 2019-06-20 DIAGNOSIS — Z1212 Encounter for screening for malignant neoplasm of rectum: Secondary | ICD-10-CM | POA: Diagnosis not present

## 2019-07-18 DIAGNOSIS — Z23 Encounter for immunization: Secondary | ICD-10-CM | POA: Diagnosis not present

## 2019-08-01 ENCOUNTER — Ambulatory Visit (INDEPENDENT_AMBULATORY_CARE_PROVIDER_SITE_OTHER): Payer: Medicare Other | Admitting: Cardiology

## 2019-08-01 ENCOUNTER — Other Ambulatory Visit: Payer: Self-pay

## 2019-08-01 ENCOUNTER — Encounter: Payer: Self-pay | Admitting: Cardiology

## 2019-08-01 DIAGNOSIS — Z45018 Encounter for adjustment and management of other part of cardiac pacemaker: Secondary | ICD-10-CM | POA: Diagnosis not present

## 2019-08-01 DIAGNOSIS — I495 Sick sinus syndrome: Secondary | ICD-10-CM

## 2019-08-01 DIAGNOSIS — Z950 Presence of cardiac pacemaker: Secondary | ICD-10-CM | POA: Diagnosis not present

## 2019-08-01 NOTE — Progress Notes (Signed)
Scheduled In office pacemaker check 08/01/19 Presenting NSR. Underlying: NSR @75 /min.  Lead threshold Normal , lead impedance Normal Mode switch None since 07/23/19   Most recent NA, longest NA.  AP < 27% VP 1.8%. Longevity > 8 years. Normal histograms. PMT 7 (patient symptomatic with palpitations early morning). Turned off PVAARP to decrease PMT.   D/W patient. Will continue to monitor his symptoms of sudden HR acceleration when he gets up at night.

## 2019-08-07 DIAGNOSIS — I10 Essential (primary) hypertension: Secondary | ICD-10-CM | POA: Diagnosis not present

## 2019-08-07 DIAGNOSIS — G4700 Insomnia, unspecified: Secondary | ICD-10-CM | POA: Diagnosis not present

## 2019-08-07 DIAGNOSIS — I38 Endocarditis, valve unspecified: Secondary | ICD-10-CM | POA: Diagnosis not present

## 2019-08-07 DIAGNOSIS — Z45018 Encounter for adjustment and management of other part of cardiac pacemaker: Secondary | ICD-10-CM | POA: Diagnosis not present

## 2019-08-07 DIAGNOSIS — I495 Sick sinus syndrome: Secondary | ICD-10-CM | POA: Diagnosis not present

## 2019-08-07 DIAGNOSIS — Z8546 Personal history of malignant neoplasm of prostate: Secondary | ICD-10-CM | POA: Diagnosis not present

## 2019-08-07 DIAGNOSIS — E782 Mixed hyperlipidemia: Secondary | ICD-10-CM | POA: Diagnosis not present

## 2019-08-07 DIAGNOSIS — Z950 Presence of cardiac pacemaker: Secondary | ICD-10-CM

## 2019-08-07 DIAGNOSIS — M797 Fibromyalgia: Secondary | ICD-10-CM | POA: Diagnosis not present

## 2019-08-22 ENCOUNTER — Ambulatory Visit: Payer: Self-pay | Admitting: Cardiology

## 2019-08-23 ENCOUNTER — Other Ambulatory Visit: Payer: Self-pay | Admitting: Cardiology

## 2019-08-25 ENCOUNTER — Other Ambulatory Visit: Payer: Self-pay | Admitting: Cardiology

## 2019-08-27 ENCOUNTER — Telehealth: Payer: Self-pay

## 2019-08-27 ENCOUNTER — Other Ambulatory Visit: Payer: Self-pay | Admitting: Cardiology

## 2019-08-27 DIAGNOSIS — I10 Essential (primary) hypertension: Secondary | ICD-10-CM

## 2019-08-27 DIAGNOSIS — I4729 Other ventricular tachycardia: Secondary | ICD-10-CM

## 2019-08-27 DIAGNOSIS — I472 Ventricular tachycardia: Secondary | ICD-10-CM

## 2019-08-27 MED ORDER — METOPROLOL SUCCINATE ER 50 MG PO TB24: 50.0000 mg | ORAL_TABLET | Freq: Every day | ORAL | 3 refills | Status: DC

## 2019-08-27 NOTE — Progress Notes (Signed)
Patient would like to try Metoprolol. Verapamil causing fatigue. Discontinued Verapamil    ICD-10-CM   1. NSVT (nonsustained ventricular tachycardia) (HCC)  I47.2 metoprolol succinate (TOPROL-XL) 50 MG 24 hr tablet  2. Essential hypertension  I10 metoprolol succinate (TOPROL-XL) 50 MG 24 hr tablet

## 2019-08-27 NOTE — Telephone Encounter (Signed)
Telephone encounter:  Reason for call: Pt c/o verapamil giving him severe muscle pain and very fatigue; He wants to know if he can switch to something else   Usual provider: Lavone Nian  Last office visit:   Next office visit: 01/30/2020   Last hospitalization:    Current Outpatient Medications on File Prior to Visit  Medication Sig Dispense Refill  . Ascorbic Acid (VITAMIN C) 1000 MG tablet Take 5,000 mg by mouth 2 (two) times daily.    Marland Kitchen atorvastatin (LIPITOR) 40 MG tablet Take 40 mg by mouth at bedtime.   5  . Ca Carbonate-Mag Hydroxide (ROLAIDS PO) Take 2-3 tablets by mouth daily as needed (acid reflux).    . diazepam (VALIUM) 10 MG tablet Take 1 tablet (10 mg total) by mouth every 6 (six) hours as needed for anxiety. 15 tablet 0  . DULoxetine (CYMBALTA) 60 MG capsule Take 2 capsules (120 mg total) by mouth daily.  3  . gabapentin (NEURONTIN) 600 MG tablet Take 600 mg by mouth 2 (two) times daily as needed (pain).    . Lysine 1000 MG TABS Take 5,000 mg by mouth 2 (two) times daily.    . metaxalone (SKELAXIN) 800 MG tablet TAKE ONE TABLET THREE TIMES DAILY AS NEEDED 90 tablet 1  . metoprolol (LOPRESSOR) 50 MG tablet Take 50 mg by mouth every evening.     . valsartan-hydrochlorothiazide (DIOVAN-HCT) 80-12.5 MG tablet TAKE ONE TABLET EVERY MORNING 90 tablet 1  . verapamil (VERELAN) 100 MG 24 hr capsule TAKE ONE CAPSULE EVERY EVENING AFTER DINNER 90 capsule 3   No current facility-administered medications on file prior to visit.

## 2019-09-07 ENCOUNTER — Other Ambulatory Visit: Payer: Self-pay | Admitting: Cardiology

## 2019-09-10 NOTE — Telephone Encounter (Signed)
Can we fill this?

## 2019-10-23 DIAGNOSIS — Z1159 Encounter for screening for other viral diseases: Secondary | ICD-10-CM | POA: Diagnosis not present

## 2019-11-02 ENCOUNTER — Other Ambulatory Visit: Payer: Self-pay | Admitting: Cardiology

## 2019-11-02 ENCOUNTER — Ambulatory Visit: Payer: Medicare Other | Attending: Internal Medicine

## 2019-11-02 DIAGNOSIS — Z23 Encounter for immunization: Secondary | ICD-10-CM | POA: Insufficient documentation

## 2019-11-02 NOTE — Progress Notes (Signed)
   Covid-19 Vaccination Clinic  Name:  Manuel Escobar    MRN: [id number] DOB: 1945/12/30  11/02/2019  [name] was observed post Covid-19 immunization for 15 minutes without incidence. He was provided with Vaccine Information Sheet and instruction to access the V-Safe system.   [name] was instructed to call 911 with any severe reactions post vaccine: Marland Kitchen Difficulty breathing  . Swelling of your face and throat  . A fast heartbeat  . A bad rash all over your body  . Dizziness and weakness    Immunizations Administered    Name Date Dose VIS Date Route   Pfizer COVID-19 Vaccine 11/02/2019  5:20 PM 0.3 mL 09/21/2019 Intramuscular   Manufacturer: [place]   Lot: [id number]   [place]: [id number]

## 2019-11-06 DIAGNOSIS — Z4502 Encounter for adjustment and management of automatic implantable cardiac defibrillator: Secondary | ICD-10-CM | POA: Diagnosis not present

## 2019-11-06 DIAGNOSIS — I495 Sick sinus syndrome: Secondary | ICD-10-CM | POA: Diagnosis not present

## 2019-11-06 DIAGNOSIS — Z95810 Presence of automatic (implantable) cardiac defibrillator: Secondary | ICD-10-CM | POA: Diagnosis not present

## 2019-11-19 DIAGNOSIS — Z8546 Personal history of malignant neoplasm of prostate: Secondary | ICD-10-CM | POA: Diagnosis not present

## 2019-11-19 DIAGNOSIS — I10 Essential (primary) hypertension: Secondary | ICD-10-CM | POA: Diagnosis not present

## 2019-11-19 DIAGNOSIS — E782 Mixed hyperlipidemia: Secondary | ICD-10-CM | POA: Diagnosis not present

## 2019-11-20 DIAGNOSIS — R739 Hyperglycemia, unspecified: Secondary | ICD-10-CM | POA: Diagnosis not present

## 2019-11-20 DIAGNOSIS — I10 Essential (primary) hypertension: Secondary | ICD-10-CM | POA: Diagnosis not present

## 2019-11-20 DIAGNOSIS — E669 Obesity, unspecified: Secondary | ICD-10-CM | POA: Diagnosis not present

## 2019-11-20 DIAGNOSIS — Z0000 Encounter for general adult medical examination without abnormal findings: Secondary | ICD-10-CM | POA: Diagnosis not present

## 2019-11-20 DIAGNOSIS — E782 Mixed hyperlipidemia: Secondary | ICD-10-CM | POA: Diagnosis not present

## 2019-11-20 DIAGNOSIS — G4700 Insomnia, unspecified: Secondary | ICD-10-CM | POA: Diagnosis not present

## 2019-11-20 DIAGNOSIS — I495 Sick sinus syndrome: Secondary | ICD-10-CM | POA: Diagnosis not present

## 2019-11-20 DIAGNOSIS — Z8546 Personal history of malignant neoplasm of prostate: Secondary | ICD-10-CM | POA: Diagnosis not present

## 2019-11-20 DIAGNOSIS — I6529 Occlusion and stenosis of unspecified carotid artery: Secondary | ICD-10-CM | POA: Diagnosis not present

## 2019-11-20 DIAGNOSIS — Z87898 Personal history of other specified conditions: Secondary | ICD-10-CM | POA: Diagnosis not present

## 2019-11-20 DIAGNOSIS — Z950 Presence of cardiac pacemaker: Secondary | ICD-10-CM | POA: Diagnosis not present

## 2019-11-20 DIAGNOSIS — M797 Fibromyalgia: Secondary | ICD-10-CM | POA: Diagnosis not present

## 2019-11-22 ENCOUNTER — Ambulatory Visit: Payer: Medicare Other | Attending: Internal Medicine

## 2019-11-22 DIAGNOSIS — Z23 Encounter for immunization: Secondary | ICD-10-CM

## 2019-11-22 NOTE — Progress Notes (Signed)
   Covid-19 Vaccination Clinic  Name:  Manuel Escobar    MRN: [id number] DOB: 09-23-1946  11/22/2019  Manuel Escobar was observed post Covid-19 immunization for 15 minutes without incidence. He was provided with Vaccine Information Sheet and instruction to access the V-Safe system.   Manuel Escobar was instructed to call 911 with any severe reactions post vaccine: Marland Kitchen Difficulty breathing  . Swelling of your face and throat  . A fast heartbeat  . A bad rash all over your body  . Dizziness and weakness    Immunizations Administered    Name Date Dose VIS Date Route   Pfizer COVID-19 Vaccine 11/22/2019 12:26 PM 0.3 mL 09/21/2019 Intramuscular   Manufacturer: [place]   Lot: [id number]   [place]: [id number]

## 2019-11-23 ENCOUNTER — Telehealth: Payer: Self-pay

## 2019-11-23 NOTE — Telephone Encounter (Signed)
The patient's wife was in to see Dr. Burt Knack today and requested Dr. Burt Knack evaluate him. He is extremely fatigued, has a pretty extensive cardiac history, and fainted about 3 weeks ago. Called the patient and scheduled him 3/26 with Dr. Burt Knack. He understands he will be called if another appointment opens up prior to that time. He was grateful for assistance.

## 2019-11-26 DIAGNOSIS — R944 Abnormal results of kidney function studies: Secondary | ICD-10-CM | POA: Diagnosis not present

## 2019-11-26 DIAGNOSIS — R739 Hyperglycemia, unspecified: Secondary | ICD-10-CM | POA: Diagnosis not present

## 2019-12-05 ENCOUNTER — Other Ambulatory Visit: Payer: Self-pay | Admitting: Cardiology

## 2019-12-27 ENCOUNTER — Other Ambulatory Visit: Payer: Self-pay | Admitting: Cardiology

## 2020-01-04 ENCOUNTER — Ambulatory Visit (INDEPENDENT_AMBULATORY_CARE_PROVIDER_SITE_OTHER): Payer: Medicare Other | Admitting: Cardiovascular Disease

## 2020-01-04 ENCOUNTER — Encounter: Payer: Self-pay | Admitting: Cardiovascular Disease

## 2020-01-04 ENCOUNTER — Other Ambulatory Visit: Payer: Self-pay

## 2020-01-04 VITALS — BP 106/74 | HR 65 | Resp 15 | Ht 70.0 in | Wt 232.8 lb

## 2020-01-04 DIAGNOSIS — R5383 Other fatigue: Secondary | ICD-10-CM | POA: Diagnosis not present

## 2020-01-04 DIAGNOSIS — I472 Ventricular tachycardia: Secondary | ICD-10-CM | POA: Diagnosis not present

## 2020-01-04 DIAGNOSIS — I350 Nonrheumatic aortic (valve) stenosis: Secondary | ICD-10-CM | POA: Diagnosis not present

## 2020-01-04 DIAGNOSIS — I4729 Other ventricular tachycardia: Secondary | ICD-10-CM

## 2020-01-04 DIAGNOSIS — I10 Essential (primary) hypertension: Secondary | ICD-10-CM | POA: Diagnosis not present

## 2020-01-04 MED ORDER — METOPROLOL SUCCINATE ER 25 MG PO TB24: 25.0000 mg | ORAL_TABLET | Freq: Every day | ORAL | 11 refills | Status: DC

## 2020-01-04 NOTE — Progress Notes (Signed)
Cardiology Office Note:    Date:  01/04/2020   ID:  Manuel Escobar, DOB 1945/12/04, MRN [id number]  PCP:  Manuel Dials, MD  Cardiologist:  Manuel Mocha, MD  Electrophysiologist:  None   Referring MD: Manuel Dials, MD   Chief Complaint  Patient presents with  . Fatigue    History of Present Illness:    Manuel Escobar is a 74 y.o. male with a hx of severe aortic stenosis presenting for cardiac evaluation. The patient underwent bioprosthetic AVR with a 25 mm bioprosthesis at [facility] in 2018.  He had previously undergone permanent pacemaker placement.  The patient is here alone today.  He has been concerned that he experiences so much fatigue.  He complains of listlessness during much of the day.  He attributes this to the use of a beta-blocker.  States he was started on a beta-blocker because he had a run of nonsustained VT detected on his pacemaker interrogation.  He has not had any symptomatic arrhythmia, lightheadedness, or syncope since undergoing pacemaker implantation several years ago.  He rides an exercise bike at level 6/10 for an hour without exertional symptoms. He specifically denies chest pain or dyspnea. No edema, orthopnea, or PND.   Past Medical History:  Diagnosis Date  . Alcoholism ([place])   . Anxiety   . Aortic stenosis 08/14/2017  . Cardiac syncope 04/28/2019  . Depression   . Encounter for care of pacemaker 04/28/2019  . History of pacemaker   . Hypertension   . Pacemaker [place] [id number] dual-chamber pacemaker in situ 04/11/2013  . Sinus node dysfunction ([place]) 04/28/2019    Past Surgical History:  Procedure Laterality Date  . APPENDECTOMY    . INTRAVASCULAR PRESSURE WIRE/FFR STUDY N/A 08/30/2017   Procedure: INTRAVASCULAR PRESSURE WIRE/FFR STUDY;  Surgeon: Manuel Mormon, MD;  Location: [place] [facility];  Service: Cardiovascular;  Laterality: N/A;  . PACEMAKER INSERTION  04/13/2014  . PROSTATECTOMY    . RIGHT/LEFT HEART CATH AND CORONARY  ANGIOGRAPHY N/A 08/30/2017   Procedure: RIGHT/LEFT HEART CATH AND CORONARY ANGIOGRAPHY;  Surgeon: Manuel Mormon, MD;  Location: [place] [facility];  Service: Cardiovascular;  Laterality: N/A;  . TEE WITHOUT CARDIOVERSION N/A 08/18/2017   Procedure: TRANSESOPHAGEAL ECHOCARDIOGRAM (TEE);  Surgeon: Manuel Prows, MD;  Location[facility] ENDOSCOPY;  Service: Cardiovascular;  Laterality: N/A;    Current Medications: Current Meds  Medication Sig  . Ascorbic Acid (VITAMIN C) 1000 MG tablet Take 5,000 mg by mouth 2 (two) times daily.  Marland Kitchen aspirin EC 81 MG tablet Take 81 mg by mouth daily.  Marland Kitchen atorvastatin (LIPITOR) 40 MG tablet Take 40 mg by mouth at bedtime.   . Ca Carbonate-Mag Hydroxide (ROLAIDS PO) Take 2-3 tablets by mouth daily as needed (acid reflux).  . diazepam (VALIUM) 10 MG tablet Take 1 tablet (10 mg total) by mouth every 6 (six) hours as needed for anxiety.  . DULoxetine (CYMBALTA) 60 MG capsule Take 2 capsules (120 mg total) by mouth daily.  Marland Kitchen gabapentin (NEURONTIN) 600 MG tablet Take 600 mg by mouth 2 (two) times daily as needed (pain).  . Lysine 1000 MG TABS Take 5,000 mg by mouth 2 (two) times daily.  . metaxalone (SKELAXIN) 800 MG tablet TAKE ONE TABLET THREE TIMES DAILY AS NEEDED  . metoprolol succinate (TOPROL-XL) 25 MG 24 hr tablet Take 1 tablet (25 mg total) by mouth daily. Take with or immediately following a meal.  . valsartan-hydrochlorothiazide (DIOVAN-HCT) 80-12.5 MG tablet TAKE ONE  TABLET EVERY MORNING  . [DISCONTINUED] metoprolol succinate (TOPROL-XL) 50 MG 24 hr tablet Take 50 mg by mouth daily. Take with or immediately following a meal.     Allergies:   Patient has no known allergies.   Social History   Socioeconomic History  . Marital status: Married    Spouse name: Not on file  . Number of children: Not on file  . Years of education: Not on file  . Highest education level: Not on file  Occupational History  . Not on file  Tobacco Use  . Smoking status: Never  Smoker  . Smokeless tobacco: Never Used  Substance and Sexual Activity  . Alcohol use: Yes    Comment: 2-3 bottles of wine daily  . Drug use: No  . Sexual activity: Not on file  Other Topics Concern  . Not on file  Social History Narrative  . Not on file   Social Determinants of Health   Financial Resource Strain:   . Difficulty of Paying Living Expenses:   Food Insecurity:   . Worried About Charity fundraiser in the Last Year:   . Arboriculturist in the Last Year:   Transportation Needs:   . Film/video editor (Medical):   Marland Kitchen Lack of Transportation (Non-Medical):   Physical Activity:   . Days of Exercise per Week:   . Minutes of Exercise per Session:   Stress:   . Feeling of Stress :   Social Connections:   . Frequency of Communication with Friends and Family:   . Frequency of Social Gatherings with Friends and Family:   . Attends Religious Services:   . Active Member of Clubs or Organizations:   . Attends Archivist Meetings:   Marland Kitchen Marital Status:      Family History: The patient's family history includes Coronary artery disease in his unknown relative; Stroke in his unknown relative.  ROS:   Please see the history of present illness.    All other systems reviewed and are negative.  EKGs/Labs/Other Studies Reviewed:    The following studies were reviewed today: Cardiac catheterization from November 2018 is reviewed and demonstrates single-vessel coronary artery disease with a 50% proximal LAD stenosis and negative FFR evaluation at 0.82.  EKG:  EKG is ordered today.  The ekg ordered today demonstrates normal sinus rhythm 65 bpm, within normal limits.  Recent Labs: No results found for requested labs within last 8760 hours.  Recent Lipid Panel No results found for: CHOL, TRIG, HDL, CHOLHDL, VLDL, LDLCALC, LDLDIRECT  Physical Exam:    VS:  BP 106/74   Pulse 65   Resp 15   Ht 5\' 10"  (1.778 m)   Wt 232 lb 12.8 oz (105.6 kg)   SpO2 98%   BMI  33.40 kg/m     Wt Readings from Last 3 Encounters:  01/04/20 232 lb 12.8 oz (105.6 kg)  09/06/17 207 lb (93.9 kg)  08/30/17 207 lb (93.9 kg)     GEN:  Well nourished, well developed in no acute distress HEENT: Normal NECK: No JVD; No carotid bruits LYMPHATICS: No lymphadenopathy CARDIAC: RRR, no murmurs, rubs, gallops RESPIRATORY:  Clear to auscultation without rales, wheezing or rhonchi  ABDOMEN: Soft, non-tender, non-distended MUSCULOSKELETAL:  No edema; No deformity  SKIN: Warm and dry NEUROLOGIC:  Alert and oriented x 3 PSYCHIATRIC:  Normal affect   ASSESSMENT:    1. Other fatigue   2. Essential hypertension   3. Nonrheumatic aortic valve stenosis  4. NSVT (nonsustained ventricular tachycardia) (HCC)    PLAN:    In order of problems listed above:  1. With no specific associated symptoms.  The patient is otherwise well-appearing and has a normal exam.  He attributes this to treatment with a beta-blocker as the timing was associated with this.  Since he has normal LV function and no other symptoms with nonobstructive coronary artery disease, I suspect his risk of sustained VT or serious arrhythmia is low.  He has a strong desire to get off of the beta-blocker if possible.  I recommended that he reduce the dose by 50% and take metoprolol succinate 25 mg for 2 weeks.  If he tolerates this well he could remain on it.  If he does not he could further reduce the dose to 12.5 mg and then stop.  If he does not experience any improvement in his fatigue, further evaluation could be considered including lab assessment, repeat echocardiogram, etc. 2. Blood pressure appears to be well controlled on current therapy. 3. The patient underwent biological aortic valve replacement at [facility] in 2018.  He appears to have had an excellent result.  I do not have access to his follow-up echo studies, but he is followed closely by Dr. Einar Gip.  His exam suggests normal function of his  bioprosthetic aortic valve. 4. As above, the patient will wean off of his beta-blocker but may be able to remain on a low-dose of metoprolol succinate if tolerated.  Follow-up: He will continue to follow with Dr. Einar Gip.  I would be happy to see him in the future if any issues arise.   Medication Adjustments/Labs and Tests Ordered: Current medicines are reviewed at length with the patient today.  Concerns regarding medicines are outlined above.  Orders Placed This Encounter  Procedures  . EKG 12-Lead   Meds ordered this encounter  Medications  . metoprolol succinate (TOPROL-XL) 25 MG 24 hr tablet    Sig: Take 1 tablet (25 mg total) by mouth daily. Take with or immediately following a meal.    Dispense:  30 tablet    Refill:  11    Patient Instructions  Medication Instructions:  1) DECREASE TOPROL to 25 mg daily for 2 weeks. If you still feel poorly, you may decrease it to 12.5 mg daily for two weeks then STOP. If you feel fine on the 25 mg daily, continue the medication as directed. *If you need a refill on your cardiac medications before your next appointment, please call your pharmacy*   Follow-Up: It was a pleasure meeting you. Please call us if you need anything!    Signed, Manuel Mocha, MD  01/04/2020 1:14 PM    [facility]

## 2020-01-04 NOTE — Patient Instructions (Signed)
Medication Instructions:  1) DECREASE TOPROL to 25 mg daily for 2 weeks. If you still feel poorly, you may decrease it to 12.5 mg daily for two weeks then STOP. If you feel fine on the 25 mg daily, continue the medication as directed. *If you need a refill on your cardiac medications before your next appointment, please call your pharmacy*   Follow-Up: It was a pleasure meeting you. Please call Manuel Escobar if you need anything!
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
## 2020-01-30 ENCOUNTER — Other Ambulatory Visit: Payer: Self-pay

## 2020-01-30 ENCOUNTER — Encounter: Payer: Self-pay | Admitting: Cardiology

## 2020-01-30 ENCOUNTER — Ambulatory Visit: Payer: Medicare Other | Admitting: Cardiology

## 2020-01-30 VITALS — BP 128/86 | HR 84 | Temp 97.2°F | Resp 14 | Ht 70.0 in | Wt 225.0 lb

## 2020-01-30 DIAGNOSIS — Z953 Presence of xenogenic heart valve: Secondary | ICD-10-CM | POA: Diagnosis not present

## 2020-01-30 DIAGNOSIS — Z950 Presence of cardiac pacemaker: Secondary | ICD-10-CM | POA: Diagnosis not present

## 2020-01-30 DIAGNOSIS — I10 Essential (primary) hypertension: Secondary | ICD-10-CM | POA: Diagnosis not present

## 2020-01-30 DIAGNOSIS — R5383 Other fatigue: Secondary | ICD-10-CM | POA: Diagnosis not present

## 2020-01-30 DIAGNOSIS — E7800 Pure hypercholesterolemia, unspecified: Secondary | ICD-10-CM | POA: Diagnosis not present

## 2020-01-30 DIAGNOSIS — I951 Orthostatic hypotension: Secondary | ICD-10-CM

## 2020-02-04 ENCOUNTER — Other Ambulatory Visit: Payer: Self-pay | Admitting: Cardiology

## 2020-02-05 DIAGNOSIS — Z950 Presence of cardiac pacemaker: Secondary | ICD-10-CM | POA: Diagnosis not present

## 2020-02-05 DIAGNOSIS — I495 Sick sinus syndrome: Secondary | ICD-10-CM | POA: Diagnosis not present

## 2020-02-05 DIAGNOSIS — Z45018 Encounter for adjustment and management of other part of cardiac pacemaker: Secondary | ICD-10-CM | POA: Diagnosis not present

## 2020-02-25 ENCOUNTER — Other Ambulatory Visit: Payer: Self-pay | Admitting: Cardiology

## 2020-03-21 ENCOUNTER — Telehealth: Payer: Self-pay | Admitting: Physical Medicine and Rehabilitation

## 2020-03-21 NOTE — Telephone Encounter (Signed)
Patient called.   He is requesting an appointment for his back pain    Call back: [phone]

## 2020-03-21 NOTE — Telephone Encounter (Signed)
Pt is scheduled for 04/08/20 as a self referral for lower back pain. Pt states he will bring in records and MRI for appt

## 2020-04-08 ENCOUNTER — Ambulatory Visit (INDEPENDENT_AMBULATORY_CARE_PROVIDER_SITE_OTHER): Payer: Medicare Other

## 2020-04-08 ENCOUNTER — Other Ambulatory Visit: Payer: Self-pay

## 2020-04-08 ENCOUNTER — Ambulatory Visit (INDEPENDENT_AMBULATORY_CARE_PROVIDER_SITE_OTHER): Payer: Medicare Other | Admitting: Physical Medicine and Rehabilitation

## 2020-04-08 ENCOUNTER — Encounter: Payer: Self-pay | Admitting: Physical Medicine and Rehabilitation

## 2020-04-08 VITALS — BP 130/81 | HR 82 | Ht 70.0 in | Wt 200.0 lb

## 2020-04-08 DIAGNOSIS — G894 Chronic pain syndrome: Secondary | ICD-10-CM | POA: Diagnosis not present

## 2020-04-08 DIAGNOSIS — R252 Cramp and spasm: Secondary | ICD-10-CM | POA: Diagnosis not present

## 2020-04-08 DIAGNOSIS — M5116 Intervertebral disc disorders with radiculopathy, lumbar region: Secondary | ICD-10-CM

## 2020-04-08 DIAGNOSIS — M5442 Lumbago with sciatica, left side: Secondary | ICD-10-CM

## 2020-04-08 DIAGNOSIS — M5441 Lumbago with sciatica, right side: Secondary | ICD-10-CM

## 2020-04-08 DIAGNOSIS — G8929 Other chronic pain: Secondary | ICD-10-CM

## 2020-04-08 MED ORDER — BACLOFEN 10 MG PO TABS: 10.0000 mg | ORAL_TABLET | Freq: Three times a day (TID) | ORAL | 0 refills | Status: DC | PRN

## 2020-04-08 NOTE — Progress Notes (Signed)
Pt states pain in the lower back mostly on the right side and radiates into the right lower leg. Pt states pain started 35 years ago. Pt states he has tried medications for pain and they did not help as much. Stretching his leg makes pain worse. Pt states he has not seen anyone recently for his lumbar pain years ago.   .Numeric Pain Rating Scale and Functional Assessment Average Pain 7 Pain Right Now 1 My pain is constant, burning and aching Pain is worse with: walking and some activites Pain improves with: rest   In the last MONTH (on 0-10 scale) has pain interfered with the following?  1. General activity like being  able to carry out your everyday physical activities such as walking, climbing stairs, carrying groceries, or moving a chair?  Rating(4)  2. Relation with others like being able to carry out your usual social activities and roles such as  activities at home, at work and in your community. Rating(4)  3. Enjoyment of life such that you have  been bothered by emotional problems such as feeling anxious, depressed or irritable?  Rating(2)

## 2020-04-09 ENCOUNTER — Encounter: Payer: Self-pay | Admitting: Physical Medicine and Rehabilitation

## 2020-04-09 NOTE — Progress Notes (Signed)
Manuel Escobar - 74 y.o. male MRN [id number]  Date of birth: 01/10/1946  Office Visit Note: Visit Date: 04/08/2020 PCP: Aura Dials, MD Referred by: Aura Dials, MD  Subjective: Chief Complaint  Patient presents with  . Lower Back - Pain  . Right Lower Leg - Pain   HPI: Manuel Escobar is a 74 y.o. male who comes in today As a self-referral for chronic worsening low back pain with referral into the right hip and leg and even the lower leg.  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  His history is such that he is a recovering alcoholic and had sought treatment at [facility] here in [place].  He has since stayed here in [place] with family and friend support.  He reports a history of more than 35 years worth of back pain and in particular really tight hamstrings and calves.  He relates a story of being a distance runner and really got into the habit of what he referred to [place] over stretching his calves and ultimately causing him to have a lot of cramping and spasm in the calves and very tight hamstrings for a long time.  He reports having had an MRI many years ago but no prior lumbar surgery.  He has not seen anyone recently and has no recent images of the spine.  He does report prior history of injection for a problem he states that he had at L2-3 in the upper lumbar spine but this was again many years ago.  His main complaint is low back pain worse with standing and ambulating and some activities without any history of paresthesia or tingling.  No focal weakness or foot drop.  No bowel or bladder changes.  Rates his pain as a 7 out of 10 minutes flaring up and active.  He reports a constant burning and aching type pain.  He denies any groin pain specifically.  No left-sided complaints.  Pain is across the back.  It does limit what he can do some days and he rates this as a 4 out of 10 in terms of  activities of daily living.  He has had a chronic pain history and he currently does take 120 mg of duloxetine as well as what appears to be 600 or 800 mg of gabapentin usually twice a day but he seems like he does take that intermittently.  He is adamant about not treating with opioids to to his alcohol recovery.  He is also used some Skelaxin as a muscle relaxer but feels like it did not help that much.  He does not report any prior lumbar surgery.  No specific spine trauma.  He has not had recent physical therapy but has had treatment some in the past again with stretching and and exercise.  Currently not really on a spine program.  Review of Systems  Musculoskeletal: Positive for back pain and joint pain.  Neurological: Negative for tingling and focal weakness.  All other systems reviewed and are negative.  Otherwise per HPI.  Assessment & Plan: Visit Diagnoses:  1. Intervertebral disc disorders with radiculopathy, lumbar region   2. Chronic bilateral low back pain with bilateral sciatica   3. Chronic pain syndrome   4. Cramp of limb     Plan: Findings:  Chronic long-term history of back pain and chronic pain syndrome treated in the past with multiple medications currently on  duloxetine and gabapentin as well as Skelaxin.  Case complicated by history of alcoholism and depression and anxiety.  Exam and imaging today consistent with progressive lumbar degenerative facet arthropathy degenerative disc height loss with foraminal narrowing particular in the lower lumbar region.  He has signs and symptoms also consistent potentially with a central canal stenosis.  His case is also complicated by significant cardiac issues with history of aortic stenosis and valve replacement and [facility] Jude pacemaker.  He feels like the [facility] pacemaker is MRI contingent.  We are going to put in an order for MRI of the lumbar spine to see if it gives [place] an idea of where to head from a treatment standpoint.  If he  cannot have an MRI would look at a CT scan of the lumbar spine.  Again depending on those results could look at interventional injections such as epidural or facet joint blocks and ablation.  Patient may ultimately be a candidate for spinal cord stimulator trial.  He also would benefit from course of physical therapy for hamstring evaluation and posture and home program of stabilization.  I did prescribe baclofen as a trial for muscle relaxer and antispasm medication.    Meds & Orders:  Meds ordered this encounter  Medications  . baclofen (LIORESAL) 10 MG tablet    Sig: Take 1 tablet (10 mg total) by mouth every 8 (eight) hours as needed for muscle spasms (Pain).    Dispense:  60 tablet    Refill:  0    Orders Placed This Encounter  Procedures  . XR Lumbar Spine Complete  . MR LUMBAR SPINE WO CONTRAST    Follow-up: Return for MRI review after completion.   Procedures: No procedures performed  No notes on file   Clinical History: No specialty comments available.   He reports that he has never smoked. He has never used smokeless tobacco. No results for input(s): HGBA1C, LABURIC in the last 8760 hours.  Objective:  VS:  HT:5\' 10"  (177.8 cm)   WT:200 lb (90.7 kg)  BMI:28.7    BP:130/81  HR:82bpm  TEMP: ( )  RESP:  Physical Exam Vitals and nursing note reviewed.  Constitutional:      General: He is not in acute distress.    Appearance: Normal appearance. He is well-developed.  HENT:     Head: Normocephalic and atraumatic.  Eyes:     Conjunctiva/sclera: Conjunctivae normal.     Pupils: Pupils are equal, round, and reactive to light.  Cardiovascular:     Rate and Rhythm: Normal rate.     Pulses: Normal pulses.     Heart sounds: Normal heart sounds.  Pulmonary:     Effort: Pulmonary effort is normal. No respiratory distress.  Musculoskeletal:     Cervical back: Normal range of motion and neck supple. No rigidity.     Right lower leg: No edema.     Left lower leg: No  edema.     Comments: Patient somewhat slow to rise from a seated position to full extension.  He does have some concordant low back pain with facet loading but very stiff the lumbar spine.  He does have trigger points noted in the lower part of the paraspinal and quadratus muscles.  Some pain over the right greater trochanter but not the left.  Some pain with hip rotation on the right but really not specific no groin pain some tightness of the hip with rotation right more than left.  He has  good distal strength and good strength with knee flexion and knee extension and hip flexion.  He has no clonus bilaterally.  He has very tight hamstrings doing a slump test but not really a positive slump test on the right compared to the left.  He has very tight calves as well.  Skin:    General: Skin is warm and dry.     Findings: No erythema or rash.  Neurological:     General: No focal deficit present.     Mental Status: He is alert and oriented to person, place, and time.     Sensory: No sensory deficit.     Motor: No weakness.     Coordination: Coordination normal.     Gait: Gait abnormal.  Psychiatric:        Mood and Affect: Mood normal.        Behavior: Behavior normal.     Ortho Exam  Imaging: XR Lumbar Spine Complete  Result Date: 04/09/2020 Imaging shows slight rightward curvature centered in the upper lumbar region with hypoplastic ribs at T12 with otherwise normal anatomic numbering.  There is degenerative disc height loss and facet arthropathy throughout the lower spine without significant listhesis.  There is likely foraminal narrowing at L5-S1 in particular.  L5-S1 is probably partially fused.  There is actually some sclerosis of the SI joints right more than left.  Hip joints show decent joint spacing a little bit of degenerative disc disease on the right more than the left with questionable subchondral cyst but no real evidence of avascular necrosis.   Past  Medical/Family/Surgical/Social History: Medications & Allergies reviewed per EMR, new medications updated. Patient Active Problem List   Diagnosis Date Noted  . Cardiac syncope 04/28/2019  . Sinus node dysfunction ([place]) 04/28/2019  . Encounter for care of pacemaker 04/28/2019  . Aortic stenosis 08/14/2017  . 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 Alcoholism ([employer])   . Anxiety   . Aortic stenosis 08/14/2017  . Cardiac syncope 04/28/2019  . Depression   . Encounter for care of pacemaker 04/28/2019  . History of pacemaker   . Hypertension   . Pacemaker [place] 6720 dual-chamber pacemaker in situ 04/11/2013  . Sinus node dysfunction ([place]) 04/28/2019   Family History  Problem Relation Age of Onset  . Heart attack Mother   . Heart attack Father   . Coronary artery disease Other   . Stroke Other   . Heart disease Sister    Past Surgical History:  Procedure Laterality Date  . APPENDECTOMY    . INTRAVASCULAR PRESSURE WIRE/FFR STUDY N/A 08/30/2017   Procedure: INTRAVASCULAR PRESSURE WIRE/FFR STUDY;  Surgeon: Nigel Mormon, MD;  Location: [place] [facility];  Service: Cardiovascular;  Laterality: N/A;  . PACEMAKER INSERTION  04/13/2014  . PROSTATECTOMY    . RIGHT/LEFT HEART CATH AND CORONARY ANGIOGRAPHY N/A 08/30/2017   Procedure: RIGHT/LEFT HEART CATH AND CORONARY ANGIOGRAPHY;  Surgeon: Nigel Mormon, MD;  Location: [place] [facility];  Service: Cardiovascular;  Laterality: N/A;  . TEE WITHOUT CARDIOVERSION N/A 08/18/2017   Procedure: TRANSESOPHAGEAL ECHOCARDIOGRAM (TEE);  Surgeon: Adrian Prows, MD;  Location[facility] END[facility]COPY;  Service: Cardiovascular;  Laterality: N/A;   Social History   Occupational History  . Not on file  Tobacco Use  . Smoking status: Never Smoker  . Smokeless tobacco: Never Used  Vaping Use  . Vaping Use: Never used  Substance and Sexual Activity  . Alcohol use:  Not Currently    Comment: 2-3 bottles of wine daily  . Drug use: No  . Sexual activity: Not on file

## 2020-04-10 ENCOUNTER — Encounter: Payer: Self-pay | Admitting: Physical Medicine and Rehabilitation

## 2020-04-16 ENCOUNTER — Other Ambulatory Visit: Payer: Self-pay | Admitting: Physical Medicine and Rehabilitation

## 2020-04-16 DIAGNOSIS — M5442 Lumbago with sciatica, left side: Secondary | ICD-10-CM

## 2020-04-16 DIAGNOSIS — G8929 Other chronic pain: Secondary | ICD-10-CM

## 2020-04-16 DIAGNOSIS — M5116 Intervertebral disc disorders with radiculopathy, lumbar region: Secondary | ICD-10-CM

## 2020-04-17 ENCOUNTER — Telehealth: Payer: Self-pay | Admitting: Physical Medicine and Rehabilitation

## 2020-04-17 ENCOUNTER — Telehealth: Payer: Self-pay | Admitting: *Deleted

## 2020-04-17 NOTE — Telephone Encounter (Signed)
Patient returning call to set appt. Patient needs to set appt with Dr. Romona Curls officePlease call patient at [phone].

## 2020-04-17 NOTE — Telephone Encounter (Signed)
Arena with [facility] called stating she was going to schedule pt for CT can LSP and pt stated he did not wants the CT he wanted the MRI, Arena explained to him reason was because the pacemaker is not compatible for MRI. Pt still wants to have the MRI but wants at hospital.  Received email from [facility] stating pt pacemaker is not MRI compatible, I tried calling pt left vm to return my call.

## 2020-04-18 NOTE — Telephone Encounter (Signed)
Called pt and lvm #1 

## 2020-04-24 ENCOUNTER — Other Ambulatory Visit: Payer: Self-pay | Admitting: Physical Medicine and Rehabilitation

## 2020-04-24 NOTE — Telephone Encounter (Signed)
Please advise 

## 2020-05-06 ENCOUNTER — Telehealth: Payer: Self-pay | Admitting: Physical Medicine and Rehabilitation

## 2020-05-06 DIAGNOSIS — Z45018 Encounter for adjustment and management of other part of cardiac pacemaker: Secondary | ICD-10-CM | POA: Diagnosis not present

## 2020-05-06 DIAGNOSIS — Z950 Presence of cardiac pacemaker: Secondary | ICD-10-CM | POA: Diagnosis not present

## 2020-05-06 DIAGNOSIS — I495 Sick sinus syndrome: Secondary | ICD-10-CM | POA: Diagnosis not present

## 2020-05-06 NOTE — Telephone Encounter (Signed)
Patient called. He would like an appointment with Dr. Newton.  

## 2020-05-07 NOTE — Telephone Encounter (Signed)
Left message #1

## 2020-05-08 ENCOUNTER — Other Ambulatory Visit: Payer: Medicare Other

## 2020-05-08 ENCOUNTER — Telehealth: Payer: Self-pay | Admitting: Physical Medicine and Rehabilitation

## 2020-05-08 NOTE — Telephone Encounter (Signed)
Patient called. He would like to schedule an appointment with Dr. Ernestina Patches. His call back number is [phone]

## 2020-05-08 NOTE — Telephone Encounter (Signed)
Left message #2

## 2020-05-08 NOTE — Telephone Encounter (Signed)
See previous message

## 2020-05-12 ENCOUNTER — Ambulatory Visit
Admission: RE | Admit: 2020-05-12 | Discharge: 2020-05-12 | Disposition: A | Discharge status: Home / Self Care | Payer: Medicare Other | Source: Ambulatory Visit | Attending: Physical Medicine and Rehabilitation | Admitting: Physical Medicine and Rehabilitation

## 2020-05-12 DIAGNOSIS — M5116 Intervertebral disc disorders with radiculopathy, lumbar region: Secondary | ICD-10-CM

## 2020-05-12 DIAGNOSIS — G8929 Other chronic pain: Secondary | ICD-10-CM

## 2020-05-12 DIAGNOSIS — M5442 Lumbago with sciatica, left side: Secondary | ICD-10-CM

## 2020-05-12 DIAGNOSIS — M545 Low back pain: Secondary | ICD-10-CM | POA: Diagnosis not present

## 2020-05-12 NOTE — Telephone Encounter (Signed)
Scheduled

## 2020-05-16 DIAGNOSIS — Z20822 Contact with and (suspected) exposure to covid-19: Secondary | ICD-10-CM | POA: Diagnosis not present

## 2020-05-20 ENCOUNTER — Ambulatory Visit (INDEPENDENT_AMBULATORY_CARE_PROVIDER_SITE_OTHER): Payer: Medicare Other | Admitting: Physical Medicine and Rehabilitation

## 2020-05-20 ENCOUNTER — Other Ambulatory Visit: Payer: Self-pay

## 2020-05-20 DIAGNOSIS — M5442 Lumbago with sciatica, left side: Secondary | ICD-10-CM

## 2020-05-20 DIAGNOSIS — G8929 Other chronic pain: Secondary | ICD-10-CM | POA: Diagnosis not present

## 2020-05-20 DIAGNOSIS — G894 Chronic pain syndrome: Secondary | ICD-10-CM

## 2020-05-20 DIAGNOSIS — M5116 Intervertebral disc disorders with radiculopathy, lumbar region: Secondary | ICD-10-CM | POA: Diagnosis not present

## 2020-05-20 DIAGNOSIS — M5441 Lumbago with sciatica, right side: Secondary | ICD-10-CM

## 2020-05-20 MED ORDER — BACLOFEN 10 MG PO TABS: 10.0000 mg | ORAL_TABLET | Freq: Three times a day (TID) | ORAL | 3 refills | Status: DC

## 2020-05-20 NOTE — Progress Notes (Signed)
Here to review lumbar CT. Baclofen works great. Pain is much improved. Numeric Pain Rating Scale and Functional Assessment Average Pain 1   In the last MONTH (on 0-10 scale) has pain interfered with the following?  1. General activity like being  able to carry out your everyday physical activities such as walking, climbing stairs, carrying groceries, or moving a chair?  Rating(1)

## 2020-05-21 ENCOUNTER — Encounter: Payer: Self-pay | Admitting: Physical Medicine and Rehabilitation

## 2020-05-21 NOTE — Progress Notes (Signed)
Fabio Pierce - 74 y.o. male MRN [id number]  Date of birth: [birth year]  Office Visit Note: Visit Date: 05/20/2020 PCP: Aura Dials, MD Referred by: Aura Dials, MD  Subjective: Chief Complaint  Patient presents with  . Lower Back - Pain   HPI: CHAN SHEAHAN is a 74 y.o. male who comes in today Return visit and follow-up evaluation management of back pain now with CT scan.  Patient's history can be reviewed in our prior notes but he is a retired Stage manager.  He is already seen reports of the CT scan and this is reviewed with him today and reviewed and the report below.  We thought he was able to have MRI with his current pacemaker but he was not able to do that.  CT scan does reveal mild multifactorial spondylitic changes throughout.  More changes in the upper lumbar and then very lower lumbar L5-S1.  By foraminal narrowing at L5-S1.  No high-grade central stenosis.  Patient has been taking baclofen prescribed for says he was having a lot of cramping and back pain and we decided to try the baclofen.  His medical history prevents use of anti-inflammatories as well as opiate type medication.  Opiate medication not really appropriate in this case in general.  Nonetheless the patient remarks how remarkable the baclofen has been.  He has been taking 10 mg 3 times per day without any issues and really having much in the way of pain relief.  He reports his pain today is a 1 out of 10.  Review of Systems  Musculoskeletal: Positive for back pain.  All other systems reviewed and are negative.  Otherwise per HPI.  Assessment & Plan: Visit Diagnoses:  1. Intervertebral disc disorders with radiculopathy, lumbar region   2. Chronic bilateral low back pain with bilateral sciatica   3. Chronic pain syndrome     Plan: Findings:  Chronic history of back pain with history of lumbar disc herniation may be in the upper lumbar region many years ago.  He does show degenerative changes of the disc in the  upper lumbar region likely representing sequela of that.  He has no lumbar central stenosis which is what I had feared.  He has been doing extremely well with the baclofen.  There is really no reason for him not to continue the baclofen if he wants.  I did put in refills today for that.  He is already taking duloxetine and gabapentin.  I think some of his pain is probably coming from the L5-S1 region with by foraminal narrowing and degenerative disc height loss.  If his symptoms return to any great deal would try transforaminal epidural steroid injection either at L5 or S1.  Even with a pacemaker patient could ultimately be candidate for spinal cord stimulator if his pain were severe and he was getting functional loss and it was deemed not a surgical issue.  Currently CT scan findings do not show anything surgical other than foraminal narrowing.    Meds & Orders:  Meds ordered this encounter  Medications  . baclofen (LIORESAL) 10 MG tablet    Sig: Take 1 tablet (10 mg total) by mouth 3 (three) times daily.    Dispense:  90 each    Refill:  3   No orders of the defined types were placed in this encounter.   Follow-up: Return if symptoms worsen or fail to improve.   Procedures: No procedures performed  No notes on file   Clinical  History: 74 year old male with chronic low back pain. History of prostate cancer. Pacemaker.  EXAM: CT LUMBAR SPINE WITHOUT CONTRAST  TECHNIQUE: Multidetector CT imaging of the lumbar spine was performed without intravenous contrast administration. Multiplanar CT image reconstructions were also generated.  COMPARISON:  Lumbar radiographs 04/08/2020.  FINDINGS: Segmentation: Normal.  Alignment: Stable straightening of lumbar lordosis as seen on radiographs in June. There is mild degenerative retrolisthesis of L5 on S1. Mild levoconvex lumbar scoliosis.  Vertebrae: No acute osseous abnormality identified. Intact visible sacrum and SI joints, with  moderate to severe degenerative sacral osteophytosis which is greater on the left (series 4, image 119).  Paraspinal and other soft tissues: Several small 4-5 mm gallstones are evident (series 3, image 6). Punctate right lower pole nephrolithiasis. Mild Aortoiliac calcified atherosclerosis. Negative lumbar paraspinal soft tissues.  Disc levels:  T11-T12: Preserved disc space height with mild mostly anterior and lateral endplate spurring. No stenosis.  T12-L1: Disc space loss with vacuum disc. Mild circumferential disc osteophyte complex. Borderline to mild left T12 foraminal stenosis.  L1-L2: Disc space loss with mild vacuum disc. Circumferential disc osteophyte complex. No convincing spinal stenosis. Borderline to mild L1 foraminal stenosis mostly on the right.  L2-L3: Disc space loss with circumferential disc bulge and endplate spurring. Up to mild spinal stenosis. Mild to moderate L2 foraminal stenosis greater on the left.  L3-L4: Mild vacuum disc. Circumferential disc bulge and endplate spurring eccentric to the right. Mild posterior element hypertrophy. No significant spinal stenosis. Up to mild left and moderate right L3 foraminal stenosis.  L4-L5: Circumferential disc bulge eccentric to the left. Endplate spurring. Moderate posterior element hypertrophy, including vacuum facet on the right. No significant spinal stenosis, although leftward disc probably results in mild left lateral recess stenosis (left L5 nerve level series 3, image 87). Moderate left and moderate to severe right L4 foraminal stenosis.  L5-S1: Disc space loss with vacuum disc. Circumferential disc osteophyte complex. Mild facet hypertrophy greater on the left. Mild epidural lipomatosis, no spinal or lateral recess stenosis. Moderate bilateral L5 foraminal stenosis.  IMPRESSION: 1. No acute or suspicious osseous abnormality in the lumbar spine.  2. Advanced disc and endplate degeneration,  especially T12-L1, L1-L2, L5-S1 where there is vacuum disc. But no significant spinal stenosis at those levels.  3. 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 Mild left lateral recess stenosis suspected at L4-L5 (left L5 nerve level). Moderate neural foraminal stenosis at the left L2, right L3, bilateral L4 (severe on the right) and bilateral L5 nerve levels.  4. Cholelithiasis and right nephrolithiasis. Aortic Atherosclerosis (ICD10-I70.0).   Electronically Signed   By: Genevie Ann M.D.   On: 05/12/2020 08:46   He reports that he has never smoked. He has never used smokeless tobacco. No results for input(s): HGBA1C, LABURIC in the last 8760 hours.  Objective:  VS:  HT:    WT:   BMI:     BP:   HR: bpm  TEMP: ( )  RESP:  Physical Exam Constitutional:      General: He is not in acute distress.    Appearance: Normal appearance. He is not ill-appearing.  HENT:     Head: Normocephalic and atraumatic.     Right Ear: External ear normal.     Left Ear: External ear normal.  Eyes:     Extraocular Movements: Extraocular movements intact.  Cardiovascular:     Rate and Rhythm: Normal rate.     Pulses: Normal pulses.  Abdominal:  General: There is no distension.     Palpations: Abdomen is soft.  Musculoskeletal:        General: No tenderness or signs of injury.     Right lower leg: No edema.     Left lower leg: No edema.     Comments: Patient has good distal strength without clonus.  Skin:    Findings: No erythema or rash.  Neurological:     General: No focal deficit present.     Mental Status: He is alert and oriented to person, place, and time.     Sensory: No sensory deficit.     Motor: No weakness or abnormal muscle tone.     Coordination: Coordination normal.  Psychiatric:        Mood and Affect: Mood normal.        Behavior: Behavior normal.     Ortho Exam  Imaging: No results found.  Past Medical/Family/Surgical/Social History: Medications  & Allergies reviewed per EMR, new medications updated. Patient Active Problem List   Diagnosis Date Noted  . Cardiac syncope 04/28/2019  . Sinus node dysfunction ([place]) 04/28/2019  . Encounter for care of pacemaker 04/28/2019  . Aortic stenosis 08/14/2017  . 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 Alcoholism ([place])   . Anxiety   . Aortic stenosis 08/14/2017  . Cardiac syncope 04/28/2019  . Depression   . Encounter for care of pacemaker 04/28/2019  . History of pacemaker   . Hypertension   . Pacemaker [place] 4854 dual-chamber pacemaker in situ 04/11/2013  . Sinus node dysfunction ([place]) 04/28/2019   Family History  Problem Relation Age of Onset  . Heart attack Mother   . Heart attack Father   . Coronary artery disease Other   . Stroke Other   . Heart disease Sister    Past Surgical History:  Procedure Laterality Date  . APPENDECTOMY    . INTRAVASCULAR PRESSURE WIRE/FFR STUDY N/A 08/30/2017   Procedure: INTRAVASCULAR PRESSURE WIRE/FFR STUDY;  Surgeon: Nigel Mormon, MD;  Location: [place] [facility];  Service: Cardiovascular;  Laterality: N/A;  . PACEMAKER INSERTION  04/13/2014  . PROSTATECTOMY    . RIGHT/LEFT HEART CATH AND CORONARY ANGIOGRAPHY N/A 08/30/2017   Procedure: RIGHT/LEFT HEART CATH AND CORONARY ANGIOGRAPHY;  Surgeon: Nigel Mormon, MD;  Location: [place] [facility];  Service: Cardiovascular;  Laterality: N/A;  . TEE WITHOUT CARDIOVERSION N/A 08/18/2017   Procedure: TRANSESOPHAGEAL ECHOCARDIOGRAM (TEE);  Surgeon: Adrian Prows, MD;  Location: [facility] ENDOSCOPY;  Service: Cardiovascular;  Laterality: N/A;   Social History   Occupational History  . Not on file  Tobacco Use  . Smoking status: Never Smoker  . Smokeless tobacco: Never Used  Vaping Use  . Vaping Use: Never used  Substance and Sexual Activity  . Alcohol use: Not Currently    Comment: 2-3 bottles of wine daily   . Drug use: No  . Sexual activity: Not on file

## 2020-06-05 DIAGNOSIS — C4442 Squamous cell carcinoma of skin of scalp and neck: Secondary | ICD-10-CM | POA: Diagnosis not present

## 2020-06-05 DIAGNOSIS — D485 Neoplasm of uncertain behavior of skin: Secondary | ICD-10-CM | POA: Diagnosis not present

## 2020-06-05 DIAGNOSIS — L570 Actinic keratosis: Secondary | ICD-10-CM | POA: Diagnosis not present

## 2020-06-05 DIAGNOSIS — X32XXXA Exposure to sunlight, initial encounter: Secondary | ICD-10-CM | POA: Diagnosis not present

## 2020-06-12 ENCOUNTER — Telehealth: Payer: Self-pay | Admitting: Student

## 2020-06-12 NOTE — Telephone Encounter (Signed)
Please set up a office visit for management of hypertension.

## 2020-06-13 NOTE — Telephone Encounter (Signed)
Done   Fu sch for 9/8 at 11 with you  Thankyou  -Leda Quail
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
## 2020-06-18 ENCOUNTER — Other Ambulatory Visit: Payer: Self-pay

## 2020-06-18 ENCOUNTER — Ambulatory Visit: Payer: Medicare Other | Admitting: Student

## 2020-06-18 ENCOUNTER — Encounter: Payer: Self-pay | Admitting: Student

## 2020-06-18 VITALS — BP 132/82 | HR 62 | Resp 16 | Ht 70.0 in | Wt 206.0 lb

## 2020-06-18 DIAGNOSIS — R5382 Chronic fatigue, unspecified: Secondary | ICD-10-CM | POA: Diagnosis not present

## 2020-06-18 DIAGNOSIS — I10 Essential (primary) hypertension: Secondary | ICD-10-CM | POA: Diagnosis not present

## 2020-06-18 DIAGNOSIS — I495 Sick sinus syndrome: Secondary | ICD-10-CM

## 2020-06-18 DIAGNOSIS — Z950 Presence of cardiac pacemaker: Secondary | ICD-10-CM

## 2020-06-18 DIAGNOSIS — Z953 Presence of xenogenic heart valve: Secondary | ICD-10-CM

## 2020-06-18 DIAGNOSIS — R0683 Snoring: Secondary | ICD-10-CM

## 2020-06-23 DIAGNOSIS — C4442 Squamous cell carcinoma of skin of scalp and neck: Secondary | ICD-10-CM | POA: Diagnosis not present

## 2020-07-02 DIAGNOSIS — R5382 Chronic fatigue, unspecified: Secondary | ICD-10-CM | POA: Diagnosis not present

## 2020-07-02 DIAGNOSIS — Z8674 Personal history of sudden cardiac arrest: Secondary | ICD-10-CM | POA: Diagnosis not present

## 2020-07-02 DIAGNOSIS — E669 Obesity, unspecified: Secondary | ICD-10-CM | POA: Diagnosis not present

## 2020-07-02 DIAGNOSIS — R7309 Other abnormal glucose: Secondary | ICD-10-CM | POA: Diagnosis not present

## 2020-07-02 DIAGNOSIS — I38 Endocarditis, valve unspecified: Secondary | ICD-10-CM | POA: Diagnosis not present

## 2020-07-02 DIAGNOSIS — I10 Essential (primary) hypertension: Secondary | ICD-10-CM | POA: Diagnosis not present

## 2020-07-02 DIAGNOSIS — G4700 Insomnia, unspecified: Secondary | ICD-10-CM | POA: Diagnosis not present

## 2020-07-02 DIAGNOSIS — G8929 Other chronic pain: Secondary | ICD-10-CM | POA: Diagnosis not present

## 2020-07-02 DIAGNOSIS — Z87898 Personal history of other specified conditions: Secondary | ICD-10-CM | POA: Diagnosis not present

## 2020-07-02 DIAGNOSIS — M545 Low back pain: Secondary | ICD-10-CM | POA: Diagnosis not present

## 2020-07-10 DIAGNOSIS — Z23 Encounter for immunization: Secondary | ICD-10-CM | POA: Diagnosis not present

## 2020-07-14 DIAGNOSIS — Z48817 Encounter for surgical aftercare following surgery on the skin and subcutaneous tissue: Secondary | ICD-10-CM | POA: Diagnosis not present

## 2020-07-24 ENCOUNTER — Encounter: Payer: Self-pay | Admitting: Neurology

## 2020-07-24 ENCOUNTER — Ambulatory Visit (INDEPENDENT_AMBULATORY_CARE_PROVIDER_SITE_OTHER): Payer: Medicare Other | Admitting: Neurology

## 2020-07-24 VITALS — BP 113/74 | HR 92 | Ht 69.0 in | Wt 223.0 lb

## 2020-07-24 DIAGNOSIS — R0681 Apnea, not elsewhere classified: Secondary | ICD-10-CM

## 2020-07-24 DIAGNOSIS — G7114 Drug induced myotonia: Secondary | ICD-10-CM

## 2020-07-24 DIAGNOSIS — M797 Fibromyalgia: Secondary | ICD-10-CM

## 2020-07-24 DIAGNOSIS — R5382 Chronic fatigue, unspecified: Secondary | ICD-10-CM | POA: Diagnosis not present

## 2020-07-24 DIAGNOSIS — Z45018 Encounter for adjustment and management of other part of cardiac pacemaker: Secondary | ICD-10-CM | POA: Diagnosis not present

## 2020-07-24 DIAGNOSIS — G478 Other sleep disorders: Secondary | ICD-10-CM

## 2020-07-24 DIAGNOSIS — I350 Nonrheumatic aortic (valve) stenosis: Secondary | ICD-10-CM | POA: Diagnosis not present

## 2020-07-24 NOTE — Patient Instructions (Signed)
Muscle Pain, Adult Muscle pain (myalgia) may be mild or severe. In most cases, the pain lasts only a short time and it goes away without treatment. It is normal to feel some muscle pain after starting a workout program. Muscles that have not been used often will be sore at first. Muscle pain may also be caused by many other things, including:  Overuse or muscle strain, especially if you are not in shape. This is the most common cause of muscle pain.  Injury.  Bruises.  Viruses, such as the flu.  Infectious diseases.  A chronic condition that causes muscle tenderness, fatigue, and headache (fibromyalgia).  A condition, such as lupus, in which the body's disease-fighting system attacks other organs in the body (autoimmune or rheumatologic diseases).  Certain drugs, including ACE inhibitors and statins. To diagnose the cause of your muscle pain, your health care provider will do a physical exam and ask questions about the pain and when it began. If you have not had muscle pain for very long, your health care provider may want to wait before doing much testing. If your muscle pain has lasted a long time, your health care provider may want to run tests right away. In some cases, this may include tests to rule out certain conditions or illnesses. Treatment for muscle pain depends on the cause. Home care is often enough to relieve muscle pain. Your health care provider may also prescribe anti-inflammatory medicine. Follow these instructions at home: Activity  If overuse is causing your muscle pain: ? Slow down your activities until the pain goes away. ? Do regular, gentle exercises if you are not usually active. ? Warm up before exercising. Stretch before and after exercising. This can help lower the risk of muscle pain.  Do not continue working out if the pain is very bad. Bad pain could mean that you have injured a muscle. Managing pain and discomfort   If directed, apply ice to the sore  muscle: ? Put ice in a plastic bag. ? Place a towel between your skin and the bag. ? Leave the ice on for 20 minutes, 2-3 times a day.  You may also alternate between applying ice and applying heat as told by your health care provider. To apply heat, use the heat source that your health care provider recommends, such as a moist heat pack or a heating pad. ? Place a towel between your skin and the heat source. ? Leave the heat on for 20-30 minutes. ? Remove the heat if your skin turns bright red. This is especially important if you are unable to feel pain, heat, or cold. You may have a greater risk of getting burned. Medicines  Take over-the-counter and prescription medicines only as told by your health care provider.  Do not drive or use heavy machinery while taking prescription pain medicine. Contact a health care provider if:  Your muscle pain gets worse and medicines do not help.  You have muscle pain that lasts longer than 3 days.  You have a rash or fever along with muscle pain.  You have muscle pain after a tick bite.  You have muscle pain while working out, even though you are in good physical condition.  You have redness, soreness, or swelling along with muscle pain.  You have muscle pain after starting a new medicine or changing the dose of a medicine. Get help right away if:  You have trouble breathing.  You have trouble swallowing.  You have muscle   pain along with a stiff neck, fever, and vomiting.  You have severe muscle weakness or cannot move part of your body. This information is not intended to replace advice given to you by your health care provider. Make sure you discuss any questions you have with your health care provider. Document Revised: 09/09/2017 Document Reviewed: 02/17/2016 Elsevier Patient Education  [place]. Hypersomnia Hypersomnia is a condition in which a person feels very tired during the day even though he or she gets plenty of  sleep at night. A person with this condition may take naps during the day and may find it very difficult to wake up from sleep. Hypersomnia may affect a person's ability to think, concentrate, drive, or remember things. What are the causes? The cause of this condition may not be known. Possible causes include:  Certain medicines.  Sleep disorders, such as narcolepsy and sleep apnea.  Injury to the head, brain, or spinal cord.  Drug or alcohol use.  Gastroesophageal reflux disease (GERD).  Tumors.  Certain medical conditions, such as depression, diabetes, or an underactive thyroid gland (hypothyroidism). What are the signs or symptoms? The main symptoms of hypersomnia include:  Feeling very tired throughout the day, regardless of how much sleep you got the night before.  Having trouble waking up. Others may find it difficult to wake you up when you are sleeping.  Sleeping for longer and longer periods at a time.  Taking naps throughout the day. Other symptoms may include:  Feeling restless, anxious, or annoyed.  Lacking energy.  Having trouble with: ? Remembering. ? Speaking. ? Thinking.  Loss of appetite.  Seeing, hearing, tasting, smelling, or feeling things that are not real (hallucinations). How is this diagnosed? This condition may be diagnosed based on:  Your symptoms and medical history.  Your sleeping habits. Your health care provider may ask you to write down your sleeping habits in a daily sleep log, along with any symptoms you have.  A series of tests that are done while you sleep (sleep study or polysomnogram).  A test that measures how quickly you can fall asleep during the day (daytime nap study or multiple sleep latency test). How is this treated? Treatment can help you manage your condition. Treatment may include:  Following a regular sleep routine.  Lifestyle changes, such as changing your eating habits, getting regular exercise, and avoiding  alcohol or caffeinated beverages.  Taking medicines to make you more alert (stimulants) during the day.  Treating any underlying medical causes of hypersomnia. Follow these instructions at home: Sleep routine   Schedule the same bedtime and wake-up time each day.  Practice a relaxing bedtime routine. This may include reading, meditation, deep breathing, or taking a warm bath before going to sleep.  Get regular exercise each day. Avoid strenuous exercise in the evening hours.  Keep your sleep environment at a cooler temperature, darkened, and quiet.  Sleep with pillows and a mattress that are comfortable and supportive.  Schedule short 20-minute naps for when you feel sleepiest during the day.  Talk with your employer or teachers about your hypersomnia. If possible, adjust your schedule so that: ? You have a regular daytime work schedule. ? You can take a scheduled nap during the day. ? You do not have to work or be active at night.  Do not eat a heavy meal for a few hours before bedtime. Eat your meals at about the same times every day.  Avoid drinking alcohol or caffeinated beverages.  Safety   Do not drive or use heavy machinery if you are sleepy. Ask your health care provider if it is safe for you to drive.  Wear a life jacket when swimming or spending time near water. General instructions  Take supplements and over-the-counter and prescription medicines only as told by your health care provider.  Keep a sleep log that will help your doctor manage your condition. This may include information about: ? What time you go to bed each night. ? How often you wake up at night. ? How many hours you sleep at night. ? How often and for how long you nap during the day. ? Any observations from others, such as leg movements during sleep, sleep walking, or snoring.  Keep all follow-up visits as told by your health care provider. This is important. Contact a health care provider  if:  You have new symptoms.  Your symptoms get worse. Get help right away if:  You have serious thoughts about hurting yourself or someone else. If you ever feel like you may hurt yourself or others, or have thoughts about taking your own life, get help right away. You can go to your nearest emergency department or call:  Your local emergency services (911 in the [place]  A suicide crisis helpline, such as the [facility] at 252-855-6996. This is open 24 hours a day. Summary  Hypersomnia refers to a condition in which you feel very tired during the day even though you get plenty of sleep at night.  A person with this condition may take naps during the day and may find it very difficult to wake up from sleep.  Hypersomnia may affect a person's ability to think, concentrate, drive, or remember things.  Treatment, such as following a regular sleep routine and making some lifestyle changes, can help you manage your condition. This information is not intended to replace advice given to you by your health care provider. Make sure you discuss any questions you have with your health care provider. Document Revised: 09/29/2017 Document Reviewed: 09/29/2017 Elsevier Patient Education  2020 [employer].

## 2020-07-24 NOTE — Progress Notes (Signed)
SLEEP MEDICINE CLINIC    Provider:  Larey Seat, MD  Primary Care Physician:  Aura Dials, [place] [address]     Referring Provider: [facility]         Chief Complaint according to patient   Patient presents with:    . New Patient (Initial Visit)     rm 10. presents today to assess if OSA is a reason behind his chronic fatigue. 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 states that he sleeps well and avg 7.5-8.5 hrs of sleep a night, may wake up to void but can go back to sleep. wife witnessed snoring and apnea breathing.       HISTORY OF PRESENT ILLNESS:  Manuel Escobar is a 74year old Caucasian male patient seen here in a referral on 07/24/2020 from Dr Irven Shelling ofice.  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 He was diagnosed with Fibromyalgia over 2 decades ago, had seen rheumatology and at the time had painful muscle fasciculations.   He feels always in some discomfort or pain, and feels severely fatigued. He ran a marathon in 1985- pain remained for a long time.   Sleep relevant medical history: Nocturia only once- sometimes nightmares, " dreaming I am on call" . Pacemaker, aortic valve replacement, prostate cancer surgery.      Family medical /sleep history: No other  family member on CPAP with OSA, insomnia, sleep walkers, father was a sober alcoholic for the last 3 decades of his life.    Social history:  Patient is retired from interventional radiology and lives in a household with spouse, has no children. The patient currently works- used to work in shifts( Presenter, broadcasting,) Pets are present.Tobacco use- never. ETOH use- in the past. Caffeine intake in form of Coffee( /) Soda( all day long) Tea ( /) or energy drinks. Regular exercise in form of walking, cycling.    Hobbies :      Sleep habits are as follows: The patient's dinner time is between 6-7  PM. The patient goes to bed at 10-11 PM, after a snack,  and continues to sleep for 7-8 hours, wakes for only one  bathroom break.   The preferred sleep position is on his right or left side , with the support of 1-2 pillows.  Dreams are reportedly infrequent   6.30 AM is the usual rise time. The patient wakes up spontaneously- " I can wake anytime I will decide to wake up".  Every day has an AA meeting at 8 AM.  He reports not feeling refreshed or restored in AM, with symptoms such as dry mouth, achiness, stiffness, and residual fatigue. Naps are taken frequently, 30-90 minutes, if feeling physically exhausted. .    Review of Systems: Out of a complete 14 system review, the patient  complains of only the following symptoms, and all other reviewed systems are negative.:  Fatigue, sleepiness , snoring, achiness, pain, fasciculations.    How likely are you to doze in the following situations: 0 = not likely, 1 = slight chance, 2 = moderate chance, 3 = high chance   Sitting and Reading? Watching Television? Sitting inactive in a public place (theater or meeting)? As a passenger in a car for an hour without a break? Lying down in the afternoon when circumstances permit? Sitting and talking to someone? Sitting quietly after lunch without alcohol? In a car, while stopped for a few minutes in traffic?     Total = 5/ 24 points   FSS endorsed at 44/ 63 points.   Social History   Socioeconomic History  . Marital status: Married    Spouse name: Not on file  . Number of children: 0  . Years of education: Not on file  . Highest education level: Not on file  Occupational History  . Not on file  Tobacco Use  . Smoking status: Never Smoker  . Smokeless tobacco: Never Used  Vaping Use  . Vaping Use: Never used  Substance and Sexual Activity  . Alcohol use: Not Currently    Comment: recovering alcholic 6 yrs sober  . Drug use: No  . Sexual activity: Not on file  Other Topics Concern  . Not on file  Social History Narrative  . Not on file   Social Determinants of Health   Financial Resource Strain:   . Difficulty of Paying Living Expenses: Not on file  Food Insecurity:   . Worried About Charity fundraiser in the Last Year: Not on file  . Ran Out of Food in the Last Year: Not on file  Transportation Needs:   . Lack of Transportation (Medical): Not on file  . Lack of Transportation (Non-Medical): Not on file  Physical Activity:   . Days of Exercise per Week: Not on file  . Minutes of Exercise per Session: Not on file  Stress:   . Feeling of Stress : Not on file  Social Connections:   . Frequency of Communication with Friends and Family: Not on file  . Frequency of Social Gatherings with Friends and Family: Not on file  . Attends Religious Services: Not on file  . Active Member of Clubs or Organizations: Not on file  . Attends Archivist Meetings: Not on file  . Marital Status: Not on file    Family History  Problem Relation Age of Onset  . Heart attack Mother   . Heart attack Father   . Coronary artery disease Other   . Stroke Other   . Heart disease Sister     Past Medical History:  Diagnosis Date  . Alcoholism ([place])   . Anxiety   . Aortic stenosis 08/14/2017  . Cardiac syncope 04/28/2019  . Depression   . Encounter for care of pacemaker 04/28/2019  .  History of pacemaker   . Hypertension   . Pacemaker Climax 7622 dual-chamber pacemaker in situ 04/11/2013  . Sinus node dysfunction ([place]) 04/28/2019    Past Surgical History:  Procedure Laterality Date  . APPENDECTOMY    . INTRAVASCULAR PRESSURE WIRE/FFR STUDY N/A 08/30/2017   Procedure: INTRAVASCULAR PRESSURE WIRE/FFR STUDY;  Surgeon: Nigel Mormon, MD;  Location: [place] [facility];  Service: Cardiovascular;  Laterality: N/A;  . PACEMAKER INSERTION  04/13/2014  . PROSTATECTOMY    .  RIGHT/LEFT HEART CATH AND CORONARY ANGIOGRAPHY N/A 08/30/2017   Procedure: RIGHT/LEFT HEART CATH AND CORONARY ANGIOGRAPHY;  Surgeon: Nigel Mormon, MD;  Location: [place] [facility];  Service: Cardiovascular;  Laterality: N/A;  . TEE WITHOUT CARDIOVERSION N/A 08/18/2017   Procedure: TRANSESOPHAGEAL ECHOCARDIOGRAM (TEE);  Surgeon: Adrian Prows, MD;  Location: [facility];  Service: Cardiovascular;  Laterality: N/A;     Current Outpatient Medications on File Prior to Visit  Medication Sig Dispense Refill  . Ascorbic Acid (VITAMIN C) 1000 MG tablet Take 3,000 mg by mouth 2 (two) times daily.     Marland Kitchen aspirin EC 81 MG tablet Take 81 mg by mouth daily.    Marland Kitchen atorvastatin (LIPITOR) 40 MG tablet Take 40 mg by mouth at bedtime.   5  . baclofen (LIORESAL) 10 MG tablet Take 1 tablet (10 mg total) by mouth 3 (three) times daily. (Patient taking differently: Take 10 mg by mouth 3 (three) times daily as needed. ) 90 each 3  . DULoxetine (CYMBALTA) 60 MG capsule Take 2 capsules (120 mg total) by mouth daily.  3  . gabapentin (NEURONTIN) 600 MG tablet Take 800 mg by mouth 3 (three) times daily.     Marland Kitchen Lysine 1000 MG TABS Take 5,000 mg by mouth 2 (two) times daily.    . valsartan-hydrochlorothiazide (DIOVAN-HCT) 80-12.5 MG tablet TAKE ONE TABLET EVERY MORNING 90 tablet 1   No current facility-administered medications on file prior to visit.    Physical exam:  Today's Vitals   07/24/20 0954  BP:  113/74  Pulse: 92  Weight: 223 lb (101.2 kg)  Height: 5\' 9"  (1.753 m)   Body mass index is 32.93 kg/m.   Wt Readings from Last 3 Encounters:  07/24/20 223 lb (101.2 kg)  06/18/20 206 lb (93.4 kg)  04/08/20 200 lb (90.7 kg)     Ht Readings from Last 3 Encounters:  07/24/20 5\' 9"  (1.753 m)  06/18/20 5\' 10"  (1.778 m)  04/08/20 5\' 10"  (1.778 m)      General: The patient is awake, alert and appears not in acute distress. The patient is well groomed. Head: Normocephalic, atraumatic. Neck is supple. Mallampati 3,  neck circumference: 17 inches .  Nasal airflow patent.  Retrognathia is not seen.  Dental status: intact Cardiovascular:  Regular rate and cardiac rhythm by pulse,  without distended neck veins. Respiratory: Lungs are clear to auscultation.  Skin:  Without evidence of ankle edema, or rash. Trunk: The patient's posture is erect.   Neurologic exam : The patient is awake and alert, oriented to place and time.   Memory subjective described as intact.  Attention span & concentration ability appears normal.  Speech is fluent,  without  dysarthria, dysphonia or aphasia.  Mood and affect are appropriate.   Cranial nerves: no loss of smell or taste reported  Pupils are equal and briskly reactive to light. Funduscopic exam deferred. Status post cataract surgery  .  Extraocular movements in vertical and horizontal planes were intact and without nystagmus. No Diplopia- . Visual fields by finger perimetry are intact. Hearing was intact to soft voice and finger rubbing.  Facial sensation intact to fine touch.  Facial motor strength is symmetric and tongue and uvula move midline.  Neck ROM : rotation, tilt and flexion extension were normal for age and shoulder shrug was symmetrical.    Motor exam:  Symmetric bulk, tone and ROM.   Normal tone without cog wheeling, symmetric grip strength .   Sensory:  Fine touch, pinprick and vibration were tested  and  normal.  Proprioception  tested in the upper extremities was normal.   Coordination: Rapid alternating movements in the fingers/hands were of normal speed.  The Finger-to-nose maneuver was intact without evidence of ataxia, dysmetria or tremor.   Gait and station: Patient could rise unassisted from a seated position, walked without assistive device.  Stance is of normal width/ base and the patient turned with 3 steps.  Toe and heel walk were deferred.  Deep tendon reflexes: in the  upper and lower extremities are symmetric and intact.  Babinski response was deferred .    After spending a total time of 50 minutes face to face and additional time for physical and neurologic examination, review of laboratory studies,  personal review of imaging studies, reports and results of other testing and review of referral information / records as far as provided in visit, I have established the following assessments:  1) His wife, Dr. Clydene Laming, MD,  has reported snoring and apnoeic breathing - he feels this is not a big problem.  2) He reports achiness and pain , stiffness. High degree of daytime fatigue, muscle fatigue- 3) hypersomnia with long sleep time, non restorative sleep; chronic fatigue,    My Plan is to proceed with:  1) attended sleep study to evaluate PLMs, fasciculations, cramping.   2) apnea screening at the same time.    I would like to thank Aura Dials, MD and [facility]ura [facility]ials, [address] [place],  [place] [address] for allowing me to meet with and to take care of this pleasant patient.   In short, MAANAV KASSABIAN is presenting with chronic fatigue. Electronically signed by: Larey Seat, MD 07/24/2020 10:20 AM  [facility] Board certified by The [employer] of Sleep Medicine and Diplomate of the [employer] of Sleep Medicine. Board certified In Neurology through the [place], Fellow of the [employer] of Neurology. Medical Director of [employer].

## 2020-07-25 DIAGNOSIS — E7800 Pure hypercholesterolemia, unspecified: Secondary | ICD-10-CM | POA: Diagnosis not present

## 2020-07-25 DIAGNOSIS — I10 Essential (primary) hypertension: Secondary | ICD-10-CM | POA: Diagnosis not present

## 2020-07-25 DIAGNOSIS — G4700 Insomnia, unspecified: Secondary | ICD-10-CM | POA: Diagnosis not present

## 2020-07-25 DIAGNOSIS — C61 Malignant neoplasm of prostate: Secondary | ICD-10-CM | POA: Diagnosis not present

## 2020-07-25 DIAGNOSIS — Z8546 Personal history of malignant neoplasm of prostate: Secondary | ICD-10-CM | POA: Diagnosis not present

## 2020-07-25 DIAGNOSIS — E782 Mixed hyperlipidemia: Secondary | ICD-10-CM | POA: Diagnosis not present

## 2020-08-05 DIAGNOSIS — Z45018 Encounter for adjustment and management of other part of cardiac pacemaker: Secondary | ICD-10-CM | POA: Diagnosis not present

## 2020-08-05 DIAGNOSIS — I495 Sick sinus syndrome: Secondary | ICD-10-CM | POA: Diagnosis not present

## 2020-08-05 DIAGNOSIS — Z950 Presence of cardiac pacemaker: Secondary | ICD-10-CM | POA: Diagnosis not present

## 2020-08-11 HISTORY — PX: SQUAMOUS CELL CARCINOMA EXCISION: SHX2433

## 2020-08-13 ENCOUNTER — Ambulatory Visit (INDEPENDENT_AMBULATORY_CARE_PROVIDER_SITE_OTHER): Payer: Medicare Other | Admitting: Neurology

## 2020-08-13 DIAGNOSIS — G478 Other sleep disorders: Secondary | ICD-10-CM

## 2020-08-13 DIAGNOSIS — M797 Fibromyalgia: Secondary | ICD-10-CM

## 2020-08-13 DIAGNOSIS — R0681 Apnea, not elsewhere classified: Secondary | ICD-10-CM

## 2020-08-13 DIAGNOSIS — G7114 Drug induced myotonia: Secondary | ICD-10-CM

## 2020-08-13 DIAGNOSIS — Z45018 Encounter for adjustment and management of other part of cardiac pacemaker: Secondary | ICD-10-CM

## 2020-08-13 DIAGNOSIS — I350 Nonrheumatic aortic (valve) stenosis: Secondary | ICD-10-CM

## 2020-08-13 DIAGNOSIS — G4733 Obstructive sleep apnea (adult) (pediatric): Secondary | ICD-10-CM

## 2020-08-14 ENCOUNTER — Encounter: Payer: 59 | Admitting: Neurology

## 2020-08-27 ENCOUNTER — Other Ambulatory Visit: Payer: Self-pay | Admitting: Cardiology

## 2020-08-28 ENCOUNTER — Other Ambulatory Visit: Payer: Self-pay

## 2020-08-28 ENCOUNTER — Telehealth: Payer: Self-pay

## 2020-08-28 DIAGNOSIS — G9332 Myalgic encephalomyelitis/chronic fatigue syndrome: Secondary | ICD-10-CM | POA: Insufficient documentation

## 2020-08-28 DIAGNOSIS — R0681 Apnea, not elsewhere classified: Secondary | ICD-10-CM | POA: Insufficient documentation

## 2020-08-28 DIAGNOSIS — G478 Other sleep disorders: Secondary | ICD-10-CM | POA: Insufficient documentation

## 2020-08-28 DIAGNOSIS — G4733 Obstructive sleep apnea (adult) (pediatric): Secondary | ICD-10-CM | POA: Insufficient documentation

## 2020-08-28 DIAGNOSIS — M797 Fibromyalgia: Secondary | ICD-10-CM | POA: Insufficient documentation

## 2020-08-28 MED ORDER — METAXALONE 800 MG PO TABS
800.0000 mg | ORAL_TABLET | Freq: Three times a day (TID) | ORAL | 2 refills | Status: DC | PRN
Start: 2020-08-28 — End: 2020-11-28

## 2020-08-28 MED ORDER — METAXALONE 800 MG PO TABS
800.0000 mg | ORAL_TABLET | Freq: Three times a day (TID) | ORAL | 2 refills | Status: DC | PRN
Start: 2020-08-28 — End: 2020-08-28

## 2020-08-28 NOTE — Progress Notes (Signed)
IMPRESSION:   1. Complex, but dominantly Obstructive Sleep Apnea (OSA) at  severe degree - AHI of 40.4/h. NREM dominant form of apnea is  unusual.  2. Snoring, loud!    RECOMMENDATIONS:   1. Advise full night, attended, PAP titration study to optimize  therapy.

## 2020-08-28 NOTE — Addendum Note (Signed)
Addended by: Larey Seat on: 08/28/2020 06:17 PM   Modules accepted: Orders

## 2020-08-28 NOTE — Progress Notes (Signed)
IMPRESSION:  1. Complex, but dominantly Obstructive Sleep Apnea (OSA) at severe degree - AHI of 40.4/h. NREM dominant form of apnea is unusual.  2. Snoring, loud!    RECOMMENDATIONS:  1. Advise full night, attended, PAP titration study to optimize therapy.  2. Only if this is due to insurance regulations or time constraints not possible, will we start on autotitration therapy instead (CPAP auto 5-18 cm water, 2 cm EPR and mask of choice, heated humidification)

## 2020-08-28 NOTE — Telephone Encounter (Signed)
Error

## 2020-08-28 NOTE — Procedures (Signed)
PATIENT'S NAME:  Manuel Escobar, Manuel Escobar DOB:      [birth year]      MR#:    [id number]     DATE OF RECORDING: 08/13/2020  B. 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  I have the pleasure of seeing Dr. 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 He was diagnosed with Fibromyalgia over 2 decades ago, had seen rheumatology and at the time had painful muscle fasciculations.   He feels always in some discomfort or pain and feels severely fatigued. His wife noted snoring and some apneas have been witnessed.    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 Naps are taken frequently, 30-90 minutes, if feeling physically exhausted.  The patient endorsed the Epworth Sleepiness Scale at 5 points. FSS endorsed at 44/63 points.  The patient's weight 223 pounds with a height of 69 (inches), resulting in a BMI of 33. kg/m2. The patient's neck circumference measured 17 inches.  CURRENT MEDICATIONS: Lipitor, Lioresal, Cymbalta, Neurontin, Diovan-HCT   PROCEDURE:  This is a multichannel digital polysomnogram utilizing the Somnostar 11.2 system.  Electrodes and sensors were applied and monitored per AASM Specifications.   EEG, EOG, Chin and Limb EMG, were sampled at  200 Hz.  ECG, Snore and Nasal Pressure, Thermal Airflow, Respiratory Effort, CPAP Flow and Pressure, Oximetry was sampled at 50 Hz. Digital video and audio were recorded.      BASELINE STUDY: Lights Out was at 22:26 and Lights On at 04:56.  Total recording time (TRT) was 388.5 minutes, with a total sleep time (TST) of 356.5 minutes.   The patient's sleep latency was 17 minutes.  REM latency was 207.5 minutes.  The sleep efficiency was 91.8 %.     SLEEP ARCHITECTURE: WASO (Wake after sleep onset) was 26.5 minutes.  There were 45.5 minutes in Stage N1, 299.5 minutes Stage N2, 0 minutes Stage N3 and 11.5 minutes in Stage REM.  The percentage of Stage N1 was 12.8%, Stage N2 was 84.%, Stage N3 was 0% and Stage R (REM sleep) was 3.2%.     RESPIRATORY ANALYSIS:  There were a total of 240 respiratory events:  72 obstructive apneas, 1 central apnea and 10 mixed apneas with a total of 83 apneas and an apnea index (AI) of 14.0 /hour. There were 157 hypopneas with a hypopnea index of 26.4 /hour.     The total APNEA/HYPOPNEA INDEX (AHI) was 40.4/hour.  4 events occurred in REM sleep and 317 events in NREM. The REM AHI was 20.9 /hour, versus a non-REM AHI of 41.4/h. The patient spent 80.5 minutes of total sleep time in the supine position and 276 minutes in non-supine. The supine AHI was 44.7/h versus a non-supine AHI of 39.1/h.  OXYGEN SATURATION & C02:  The Wake baseline 02 saturation was 94%, with the lowest being 76%. Time spent below  89% saturation equaled 108 minutes. The arousals were noted as: 43 were spontaneous, 0 were associated with PLMs, 91 were associated with respiratory events. The patient had a total of 0 Periodic Limb Movements.   Audio and video analysis did not show any abnormal or unusual movements, behaviors, phonations or vocalizations.  Very loud snoring was noted. EKG was paced and in keeping with regular rhythm (NSR).   IMPRESSION:  1. Complex, but dominantly Obstructive Sleep Apnea  (OSA) at severe degree - AHI of 40.4/h. NREM dominant form of apnea is unusual.  2. Snoring, loud!    RECOMMENDATIONS:  1. Advise full night, attended, PAP titration study to optimize therapy.  2. Only if this is due to insurance regulations or time constraints not possible, will we start on autotitration therapy instead ( CPAP auto 5-18 cm water, 2 cm EPR and mask of choice, heated humidification)    I certify that I have reviewed the entire raw data recording prior to the issuance of this report in accordance with the Standards of Accreditation of the American Academy of Sleep Medicine (AASM)   Larey Seat, MD Diplomat, American Board of Psychiatry and Neurology  Diplomat, American Board of Sleep Medicine Market researcher, [facility] [employer]

## 2020-09-01 ENCOUNTER — Telehealth: Payer: Self-pay | Admitting: Neurology

## 2020-09-01 NOTE — Telephone Encounter (Signed)

## 2020-09-01 NOTE — Telephone Encounter (Signed)
-----   Message from Larey Seat, MD sent at 08/28/2020  6:17 PM EST ----- IMPRESSION:  1. Complex, but dominantly Obstructive Sleep Apnea (OSA) at severe degree - AHI of 40.4/h. NREM dominant form of apnea is unusual.  2. Snoring, loud!    RECOMMENDATIONS:  1. Advise full night, attended, PAP titration study to optimize therapy.  2. Only if this is due to insurance regulations or time constraints not possible, will we start on autotitration therapy instead (CPAP auto 5-18 cm water, 2 cm EPR and mask of choice, heated humidification)

## 2020-09-05 ENCOUNTER — Other Ambulatory Visit: Payer: Self-pay | Admitting: Cardiology

## 2020-09-07 NOTE — Progress Notes (Signed)
Full Name: Manuel Escobar Gender: Male MRN #: [id number] Date of Birth: 10-Jul-1946    Visit Date: 09/08/2020 12:21 Age: 74 Years Examining Physician: Sarina Ill, MD  Referring Physician: Larey Seat, MD Height: 5 feet 9 inch Patient Weight: 223lbs  History: Lower extremity cramping, soreness and fasciculations.  Was diagnosed with fibromyalgia over 2 decades ago, had seen rheumatology and at the time had painful muscle fasciculations and currently he feels he always has some discomfort, cramping or pain in the legs and feels severely fatigued.  Summary: EMG/NCS was performed on the bilateral lower extremities. All nerves and muscles (as indicated in the following tables) were within normal limits.    Conclusion: This is a normal study. No electrophysiologic evidence for mononeuropathy, polyneuropathy, lumbar radiculopathy or muscle disorder.    Sarina Ill, M.D.  [facility] [place], [place] [address] Tel: [phone] Fax: [phone]  Verbal informed consent was obtained from the patient, patient was informed of potential risk of procedure, including bruising, bleeding, hematoma formation, infection, muscle weakness, muscle pain, numbness, among others.         [place]    Nerve / Sites Muscle Latency Ref. Amplitude Ref. Rel Amp Segments Distance Velocity Ref. Area    ms ms mV mV %  cm m/s m/s mVms  R Peroneal - EDB     Ankle EDB 5.2 ?6.5 4.6 ?2.0 100 Ankle - EDB 9   15.6     Fib head EDB 11.8  4.7  102 Fib head - Ankle 29 44 ?44 19.0     Pop fossa EDB 14.1  3.9  83.4 Pop fossa - Fib head 10 44 ?44 18.0         Pop fossa - Ankle      L Peroneal - EDB     Ankle EDB 5.1 ?6.5 5.3 ?2.0 100 Ankle - EDB 9   19.0     Fib head EDB 11.8  4.9  91.8 Fib head - Ankle 29 44 ?44 19.7     Pop fossa EDB 14.1  4.5  91.4 Pop fossa - Fib head 10 44 ?44 18.9         Pop fossa - Ankle      R Tibial - AH     Ankle AH 4.5 ?5.8 5.6 ?4.0 100 Ankle - AH 9    12.0     Pop fossa AH 14.4  3.1  55.7 Pop fossa - Ankle 40 41 ?41 16.8  L Tibial - AH     Ankle AH 5.0 ?5.8 8.1 ?4.0 100 Ankle - AH 9   15.9     Pop fossa AH 14.7  6.1  75.5 Pop fossa - Ankle 40 41 ?41 15.8             SNC    Nerve / Sites Rec. Site Peak Lat Ref.  Amp Ref. Segments Distance    ms ms V V  cm  R Sural - Ankle (Calf)     Calf Ankle 3.9 ?4.4 13 ?6 Calf - Ankle 14  L Sural - Ankle (Calf)     Calf Ankle 4.3 ?4.4 8 ?6 Calf - Ankle 14  R Superficial peroneal - Ankle     Lat leg Ankle 4.0 ?4.4 6 ?6 Lat leg - Ankle 14  L Superficial peroneal - Ankle     Lat leg Ankle 4.3 ?4.4 8 ?6 Lat leg - Ankle 14  F  Wave    Nerve F Lat Ref.   ms ms  R Tibial - AH 55.8 ?56.0  L Tibial - AH 55.7 ?56.0         EMG Summary Table    Spontaneous MUAP Recruitment  Muscle IA Fib PSW Fasc Other Amp Dur. Poly Pattern  R. Iliopsoas Normal None None None _______ Normal Normal Normal Normal  R. Vastus medialis Normal None None None _______ Normal Normal Normal Normal  R. Gastrocnemius (Lateral head) Normal None None None _______ Normal Normal Normal Normal  R. Gastrocnemius (Medial head) Normal None None None _______ Normal Normal Normal Normal  R. Tibialis anterior Normal None None None _______ Normal Normal Normal Normal  R. Extensor hallucis longus Normal None None None _______ Normal Normal Normal Normal  R. Biceps femoris (long head) Normal None None None _______ Normal Normal Normal Normal  R. Gluteus maximus Normal None None None _______ Normal Normal Normal Normal  R. Gluteus medius Normal None None None _______ Normal Normal Normal Normal  R. Lumbar paraspinals (low) Normal None None None _______ Normal Normal Normal Normal

## 2020-09-08 ENCOUNTER — Ambulatory Visit (INDEPENDENT_AMBULATORY_CARE_PROVIDER_SITE_OTHER): Payer: Medicare Other | Admitting: Neurology

## 2020-09-08 ENCOUNTER — Telehealth: Payer: Self-pay

## 2020-09-08 DIAGNOSIS — R252 Cramp and spasm: Secondary | ICD-10-CM | POA: Diagnosis not present

## 2020-09-08 DIAGNOSIS — Z0289 Encounter for other administrative examinations: Secondary | ICD-10-CM

## 2020-09-08 DIAGNOSIS — M797 Fibromyalgia: Secondary | ICD-10-CM

## 2020-09-08 DIAGNOSIS — G7114 Drug induced myotonia: Secondary | ICD-10-CM

## 2020-09-08 NOTE — Procedures (Signed)
Full Name: Bertrand Vowels Gender: Male MRN #: [id number] Date of Birth: 10-Nov-1945    Visit Date: 09/08/2020 12:21 Age: 74 Years Examining Physician: Sarina Ill, MD  Referring Physician: Larey Seat, MD Height: 5 feet 9 inch Patient Weight: 223lbs  History: Lower extremity cramping, soreness and fasciculations.  Was diagnosed with fibromyalgia over 2 decades ago, had seen rheumatology and at the time had painful muscle fasciculations and currently he feels he always has some discomfort, cramping or pain in the legs and feels severely fatigued.  Summary: EMG/NCS was performed on the bilateral lower extremities. All nerves and muscles (as indicated in the following tables) were within normal limits.    Conclusion: This is a normal study. No electrophysiologic evidence for mononeuropathy, polyneuropathy, lumbar radiculopathy or muscle disorder.    Sarina Ill, M.D.  [facility] [place], [place] [address] Tel: [phone] Fax: [phone]  Verbal informed consent was obtained from the patient, patient was informed of potential risk of procedure, including bruising, bleeding, hematoma formation, infection, muscle weakness, muscle pain, numbness, among others.         [place]    Nerve / Sites Muscle Latency Ref. Amplitude Ref. Rel Amp Segments Distance Velocity Ref. Area    ms ms mV mV %  cm m/s m/s mVms  R Peroneal - EDB     Ankle EDB 5.2 ?6.5 4.6 ?2.0 100 Ankle - EDB 9   15.6     Fib head EDB 11.8  4.7  102 Fib head - Ankle 29 44 ?44 19.0     Pop fossa EDB 14.1  3.9  83.4 Pop fossa - Fib head 10 44 ?44 18.0         Pop fossa - Ankle      L Peroneal - EDB     Ankle EDB 5.1 ?6.5 5.3 ?2.0 100 Ankle - EDB 9   19.0     Fib head EDB 11.8  4.9  91.8 Fib head - Ankle 29 44 ?44 19.7     Pop fossa EDB 14.1  4.5  91.4 Pop fossa - Fib head 10 44 ?44 18.9         Pop fossa - Ankle      R Tibial - AH     Ankle AH 4.5 ?5.8 5.6 ?4.0 100 Ankle - AH 9    12.0     Pop fossa AH 14.4  3.1  55.7 Pop fossa - Ankle 40 41 ?41 16.8  L Tibial - AH     Ankle AH 5.0 ?5.8 8.1 ?4.0 100 Ankle - AH 9   15.9     Pop fossa AH 14.7  6.1  75.5 Pop fossa - Ankle 40 41 ?41 15.8             [facility]    Nerve / Sites Rec. Site Peak Lat Ref.  Amp Ref. Segments Distance    ms ms V V  cm  R Sural - Ankle (Calf)     Calf Ankle 3.9 ?4.4 13 ?6 Calf - Ankle 14  L Sural - Ankle (Calf)     Calf Ankle 4.3 ?4.4 8 ?6 Calf - Ankle 14  R Superficial peroneal - Ankle     Lat leg Ankle 4.0 ?4.4 6 ?6 Lat leg - Ankle 14  L Superficial peroneal - Ankle     Lat leg Ankle 4.3 ?4.4 8 ?6 Lat leg - Ankle 14  F  Wave    Nerve F Lat Ref.   ms ms  R Tibial - AH 55.8 ?56.0  L Tibial - AH 55.7 ?56.0         EMG Summary Table    Spontaneous MUAP Recruitment  Muscle IA Fib PSW Fasc Other Amp Dur. Poly Pattern  R. Iliopsoas Normal None None None _______ Normal Normal Normal Normal  R. Vastus medialis Normal None None None _______ Normal Normal Normal Normal  R. Gastrocnemius (Lateral head) Normal None None None _______ Normal Normal Normal Normal  R. Gastrocnemius (Medial head) Normal None None None _______ Normal Normal Normal Normal  R. Tibialis anterior Normal None None None _______ Normal Normal Normal Normal  R. Extensor hallucis longus Normal None None None _______ Normal Normal Normal Normal  R. Biceps femoris (long head) Normal None None None _______ Normal Normal Normal Normal  R. Gluteus maximus Normal None None None _______ Normal Normal Normal Normal  R. Gluteus medius Normal None None None _______ Normal Normal Normal Normal  R. Lumbar paraspinals (low) Normal None None None _______ Normal Normal Normal Normal

## 2020-09-08 NOTE — Telephone Encounter (Signed)
LVM for pt to call me back to schedule sleep study  

## 2020-09-08 NOTE — Progress Notes (Signed)
See procedure note.

## 2020-09-09 ENCOUNTER — Telehealth: Payer: Self-pay

## 2020-09-09 NOTE — Telephone Encounter (Signed)
I called pt. I advised him that his NCV/EMG was normal. Pt verbalized understanding of results. Pt had no questions at this time but was encouraged to call back if questions arise.

## 2020-09-09 NOTE — Progress Notes (Signed)
Dr. Raynelle Dick, MD presented with bilateral lower extremity cramping,  All nerves and muscles (as indicated in the  following tables) were within normal limits.    Conclusion: This is a normal study. No electrophysiologic  evidence for mononeuropathy, polyneuropathy, lumbar radiculopathy  or muscle disorder.    Sarina Ill, M.D.

## 2020-09-09 NOTE — Telephone Encounter (Signed)
-----   Message from Larey Seat, MD sent at 09/09/2020  1:09 PM EST ----- Dr. Raynelle Dick, MD presented with bilateral lower extremity cramping,  All nerves and muscles (as indicated in the  following tables) were within normal limits.    Conclusion: This is a normal study. No electrophysiologic  evidence for mononeuropathy, polyneuropathy, lumbar radiculopathy  or muscle disorder.    Sarina Ill, M.D.

## 2020-09-10 DIAGNOSIS — B351 Tinea unguium: Secondary | ICD-10-CM | POA: Diagnosis not present

## 2020-09-10 DIAGNOSIS — Z85828 Personal history of other malignant neoplasm of skin: Secondary | ICD-10-CM | POA: Diagnosis not present

## 2020-09-10 DIAGNOSIS — D225 Melanocytic nevi of trunk: Secondary | ICD-10-CM | POA: Diagnosis not present

## 2020-09-10 DIAGNOSIS — L821 Other seborrheic keratosis: Secondary | ICD-10-CM | POA: Diagnosis not present

## 2020-09-10 DIAGNOSIS — X32XXXA Exposure to sunlight, initial encounter: Secondary | ICD-10-CM | POA: Diagnosis not present

## 2020-09-10 DIAGNOSIS — Z08 Encounter for follow-up examination after completed treatment for malignant neoplasm: Secondary | ICD-10-CM | POA: Diagnosis not present

## 2020-09-10 DIAGNOSIS — L570 Actinic keratosis: Secondary | ICD-10-CM | POA: Diagnosis not present

## 2020-09-11 ENCOUNTER — Ambulatory Visit (INDEPENDENT_AMBULATORY_CARE_PROVIDER_SITE_OTHER): Payer: Medicare Other | Admitting: Neurology

## 2020-09-11 ENCOUNTER — Other Ambulatory Visit: Payer: Self-pay

## 2020-09-11 DIAGNOSIS — I350 Nonrheumatic aortic (valve) stenosis: Secondary | ICD-10-CM

## 2020-09-11 DIAGNOSIS — G4733 Obstructive sleep apnea (adult) (pediatric): Secondary | ICD-10-CM

## 2020-09-11 DIAGNOSIS — M797 Fibromyalgia: Secondary | ICD-10-CM

## 2020-09-11 DIAGNOSIS — G478 Other sleep disorders: Secondary | ICD-10-CM

## 2020-09-11 DIAGNOSIS — R0681 Apnea, not elsewhere classified: Secondary | ICD-10-CM

## 2020-09-11 DIAGNOSIS — G4734 Idiopathic sleep related nonobstructive alveolar hypoventilation: Secondary | ICD-10-CM

## 2020-09-14 DIAGNOSIS — G4733 Obstructive sleep apnea (adult) (pediatric): Secondary | ICD-10-CM | POA: Insufficient documentation

## 2020-09-14 DIAGNOSIS — G4734 Idiopathic sleep related nonobstructive alveolar hypoventilation: Secondary | ICD-10-CM | POA: Insufficient documentation

## 2020-09-14 NOTE — Procedures (Signed)
PATIENT'S NAME:  Argenis, Kumari. 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 NREM dominant form of apnea is unusual. REM AHI was only 20.9/h.  2) Sleep apnea related hypoxemia was noted at a Nadir SpO2 of 76% with 108 minutes of total time in desaturation. Snoring was loud!.  There is a past medical history of total prostate surgery, by da-Vinci robot, remote Alcoholism [facility]), Anxiety, Aortic stenosis (08/14/2017), Cardiac syncope (04/28/2019), Depression, Encounter for care of pacemaker (04/28/2019), History of pacemaker, Hypertension, aortic valve stenosis and replacement, 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 He was diagnosed with Fibromyalgia over 2 decades ago, had seen rheumatology and at the time had painful muscle fasciculations. He would like to try a non-FFM first should his apnea require CPAP.   The patient endorsed the Epworth Sleepiness Scale at 5 points.   The patient's weight 223 pounds with a height of 69 (inches), resulting in a BMI of 33.1 kg/m2.  CURRENT MEDICATIONS: Lipitor, Lioresal, Cymbalta, Neurontin, Diovan-HCT PROCEDURE:  This is a multichannel digital polysomnogram utilizing the SomnoStar 11.2 system.  Electrodes and sensors were applied and monitored per AASM Specifications.   EEG, EOG, Chin and Limb EMG, were sampled at 200 Hz.  ECG, Snore and Nasal Pressure, Thermal Airflow, Respiratory Effort, CPAP Flow and Pressure, Oximetry was sampled at 50 Hz. Digital video and audio were recorded.      The patient was  fitted with a Medium sized Eson 2 nasal mask .CPAP was initiated at 5 cmH20 with heated humidity per AASM split night standards and pressure was advanced to 7cmH20 because of hypopneas, apneas and desaturations.  At a PAP pressure of 7 cmH20, there was a reduction of the AHI to 0.7 with improvement of sleep apnea.  Lights Out was at 21:46 and Lights On at 04:54. Total recording time (TRT) was 429 minutes, with a total sleep time (TST) of 403 minutes. The patient's sleep latency was 4 minutes. REM latency was 242.5 minutes.  The sleep efficiency was 93.9 %.    SLEEP ARCHITECTURE: WASO (Wake after sleep onset) was 21.5 minutes.  There were 30.5 minutes in Stage N1, 351.5 minutes Stage N2, 10.5 minutes Stage N3 and 10.5 minutes in Stage REM.  The percentage of Stage N1 was 7.6%, Stage N2 was 87.2%, Stage N3 was 2.6% and Stage R (REM sleep) was 2.6%. The sleep architecture was notable for very little REM sleep.  RESPIRATORY ANALYSIS:  There was a total of 3 respiratory events: 0 obstructive apneas, 0 central apneas and 2 mixed apneas with 1 hypopnea.  The total APNEA/HYPOPNEA INDEX  (AHI) was 0.4 /hour  0 events occurred in REM sleep and 3 events in NREM. The REM AHI was 0 /hour versus a non-REM AHI of 0.5 /hour.   The patient spent 45.5 minutes of total sleep time in the supine position and 358 minutes in non-supine. The supine AHI was 1.3/h, versus a non-supine AHI of 0.4.  OXYGEN SATURATION & C02:  The baseline 02  saturation was 93%, with the lowest being 90%. Time spent below 89% saturation equaled 0 minutes. The arousals were noted as: 17 were spontaneous, 0 were associated with PLMs, 1 was associated with respiratory event. The patient had a total of 0 Periodic Limb Movements  Audio and video analysis did not show any abnormal or unusual movements, behaviors, phonations or vocalizations.  The patient took a bathroom break. Snoring was noted at lower pressures of CPAP.EKG was  paced.  DIAGNOSIS 1. Primary Snoring and Obstructive Sleep Apnea were controlled under a pressure of only 7 cm water CPAP.  2. Sleep Related Hypoxemia was completely alleviated.  3. 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  4. No Periodic Limb Movements were recorded.    PLANS/RECOMMENDATIONS: The patient was fitted with a Medium sized Eson 2 nasal mask and tolerated CPAP well. The new device will be set for a pressure window of 5-12 cm water with 3 cm EPR, with heated humidity. [facility] to become his DME.     DISCUSSION: A follow up appointment will be scheduled in the Sleep Clinic at [facility].   Please call [phone] with any questions.      I certify that I have reviewed the entire raw data recording prior to the issuance of this report in accordance with the Standards of Accreditation of the American Academy of Sleep Medicine (AASM)  Larey Seat, M.D. Medical Director, [facility] [employer] [facility] Neu[facility] and Sleep Medicine(Neurology and Sleep Medicine)

## 2020-09-14 NOTE — Addendum Note (Signed)
Addended by: Larey Seat on: 09/14/2020 06:52 PM   Modules accepted: Orders

## 2020-09-14 NOTE — Progress Notes (Signed)
DIAGNOSIS 1. Primary Snoring and Obstructive Sleep Apnea were controlled under a pressure of only 7 cm water CPAP.  2. Sleep Related Hypoxemia was completely alleviated.  3. 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  4. No Periodic Limb Movements were recorded.    PLANS/RECOMMENDATIONS: The patient was fitted with a Medium sized Eson 2 nasal mask and tolerated CPAP well. The new device will be set for a pressure window of 5-12 cm water with 3 cm EPR, with heated humidity. [facility] to become his DME.

## 2020-09-15 ENCOUNTER — Telehealth: Payer: Self-pay | Admitting: Neurology

## 2020-09-15 NOTE — Telephone Encounter (Signed)
Called the patient to discuss sleep study results. I advised pt that Dr. Brett Fairy reviewed their sleep study results and found that pt has was best treated under pressure of 7 cm water pressure. Dr. Brett Fairy recommends that pt starts auto CPAP 5-12 cm water pressure. I reviewed PAP compliance expectations with the pt. Pt is agreeable to starting a CPAP. I advised pt that an order will be sent to a DME, [facility] ([facility] and [facility] ([place]) will call the pt within about one week after they file with the pt's insurance. [facility] [employer]) will show the pt how to use the machine, fit for masks, and troubleshoot the CPAP if needed. A follow up appt will need to be made for insurance purposes with Dr. Brett Fairy or NP. Pt verbalized understanding to call and schedule 31-90 day follow up from date he is scheduled to pick up the machine. A letter with all of this information in it will be mailed to the pt as a reminder. I verified with the pt- that the address we have on file is correct. Pt verbalized understanding of results. Pt had no questions at this time but was encouraged to call back if questions arise. I have sent the order to [facility]) and have received confirmation that they have received the order.

## 2020-09-15 NOTE — Telephone Encounter (Signed)
Pt called wanting to discuss his last sleep study results with provider. Please advise.

## 2020-09-15 NOTE — Telephone Encounter (Signed)
-----   Message from Larey Seat, MD sent at 09/14/2020  6:52 PM EST ----- DIAGNOSIS 1. Primary Snoring and Obstructive Sleep Apnea were controlled under a pressure of only 7 cm water CPAP.  2. Sleep Related Hypoxemia was completely alleviated.  3. 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  4. No Periodic Limb Movements were recorded.    PLANS/RECOMMENDATIONS: The patient was fitted with a Medium sized Eson 2 nasal mask and tolerated CPAP well. The new device will be set for a pressure window of 5-12 cm water with 3 cm EPR, with heated humidity. [facility] to become his DME.

## 2020-10-23 ENCOUNTER — Telehealth: Payer: Self-pay | Admitting: Neurology

## 2020-10-23 NOTE — Telephone Encounter (Signed)
Pt called, two months ago [facility] told me would be 6 to 8 weeks before I would get my CPAP supplies. Can Dr. Brett Fairy refer me to another company? Would like a call from the nurse.

## 2020-10-23 NOTE — Telephone Encounter (Signed)
I called [facility] and spoke with Lilia Pro. She stated that she spoke with the patient on 10/07/20 and let him know there was a 4-6 week delay and that he also has a CPAP order. She stated he was on the list and they would be calling him as they get things in. I called the pt and let him know what Lilia Pro from [place] told me. He verbalized understanding and will wait little longer. I encouraged him to call back if needed. He verbalized appreciation for the call.

## 2020-10-24 IMAGING — CT CT L SPINE W/O CM
3 of 4 series · 12 of 33 positions shown, 14 images · non-contrast
Comparison: Lumbar radiographs 04/08/2020.

CLINICAL DATA: 74-year-old male with chronic low back pain. History
of prostate cancer. Pacemaker.

EXAM:
CT LUMBAR SPINE WITHOUT CONTRAST
TECHNIQUE: Multidetector CT imaging of the lumbar spine was performed without
intravenous contrast administration. Multiplanar CT image
reconstructions were also generated.

[Series 3: l-spine 2.00 br40 s3 lspine st · axial · 0.40mm/px · z∈[+1449,+1617]mm · 4 of 126 slices shown, 5 images]
[im 21/126  soft-tissue]
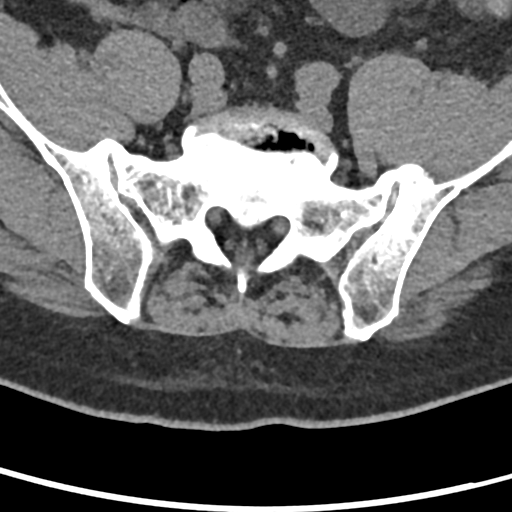
[im 21/126  bone]
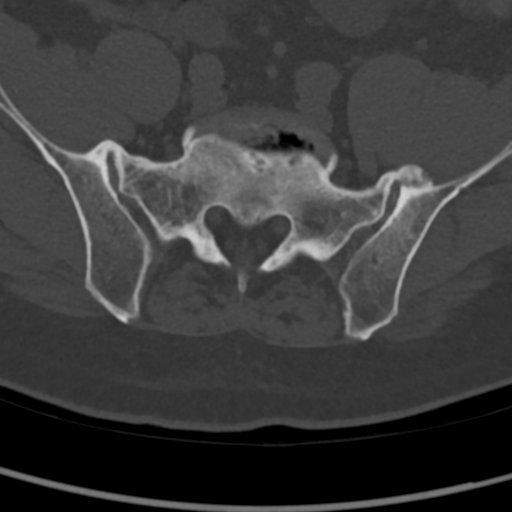
[im 42/126  bone]
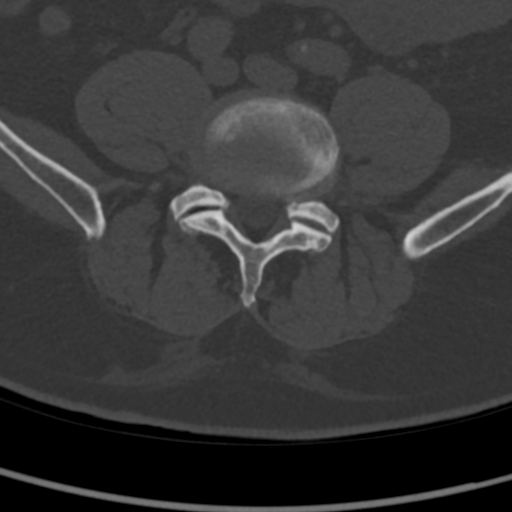
[im 84/126  bone]
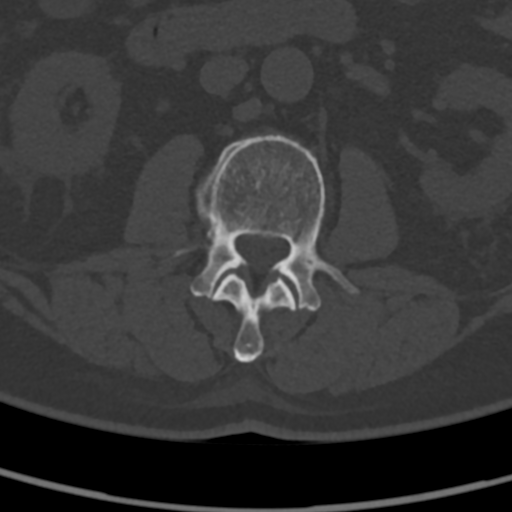
[im 105/126  bone]
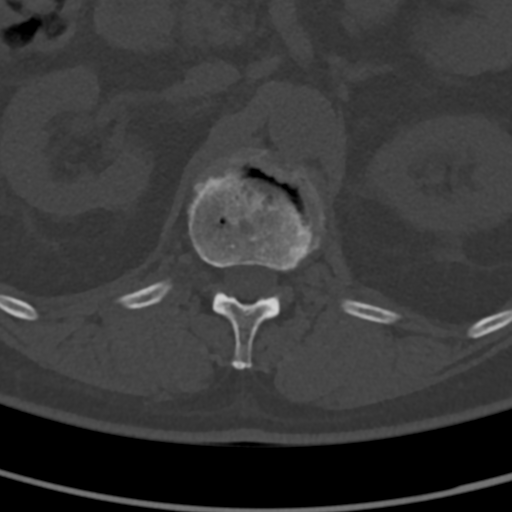

[Series 5: l-spine 2.00 br60 s3 sag bone · sagittal · 0.35mm/px · 5 of 80 slices shown, 6 images]
[im 27/80  bone]
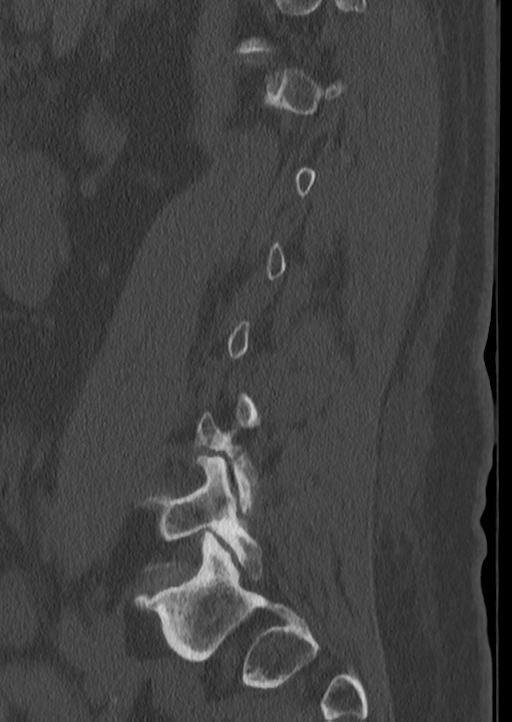
[im 33/80  bone]
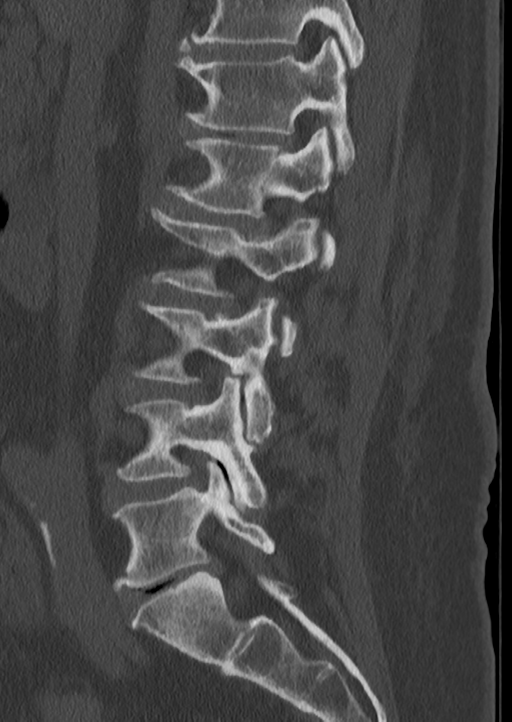
[im 40/80  soft-tissue]
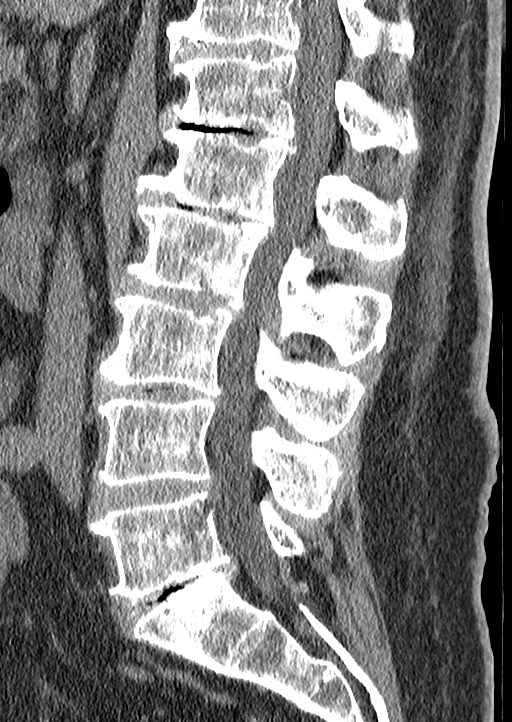
[im 40/80  bone]
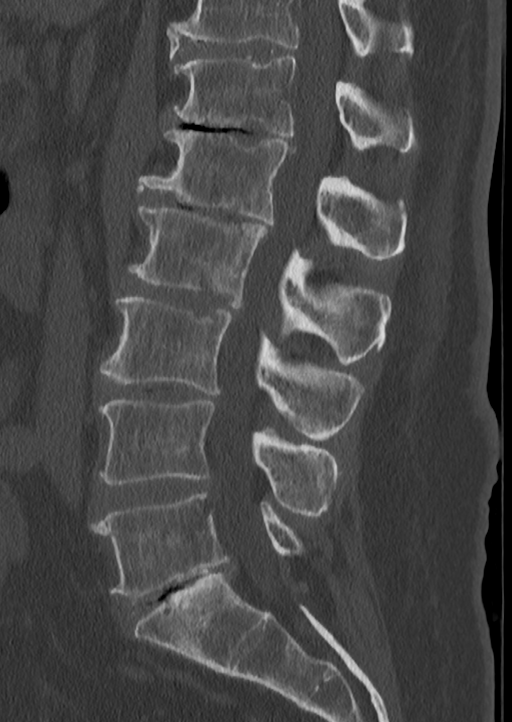
[im 47/80  bone]
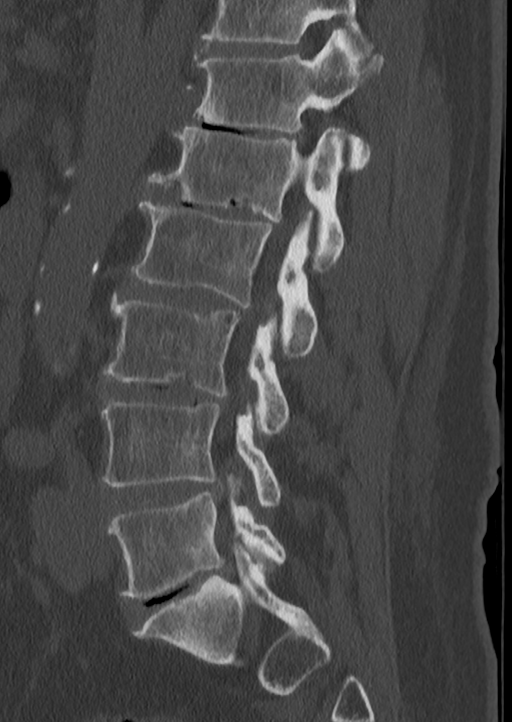
[im 53/80  bone]
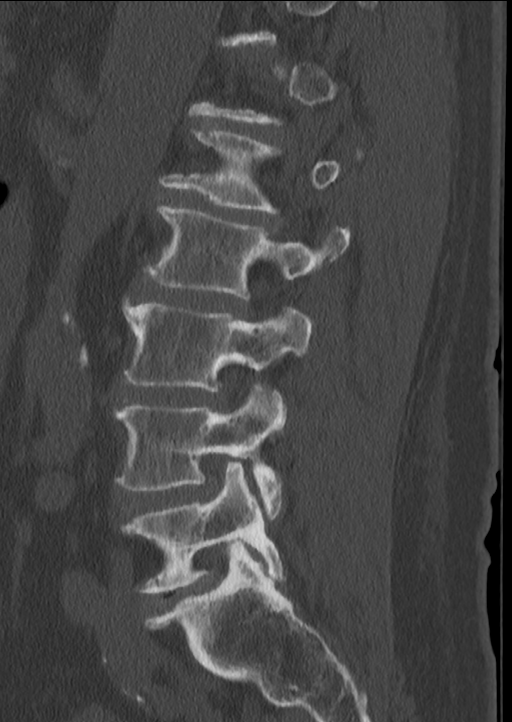

[Series 7: l-spine 2.00 br60 s3 cor bone · coronal · 0.31mm/px · 3 of 89 slices shown]
[im 18/89  bone]
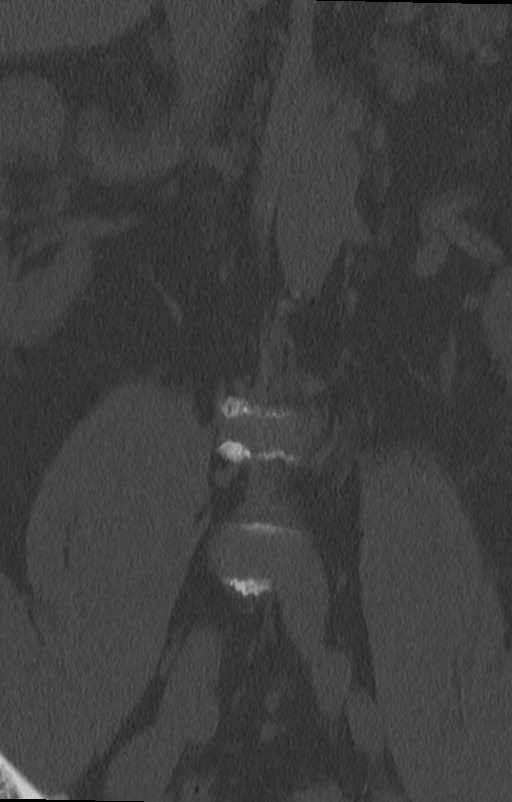
[im 36/89  bone]
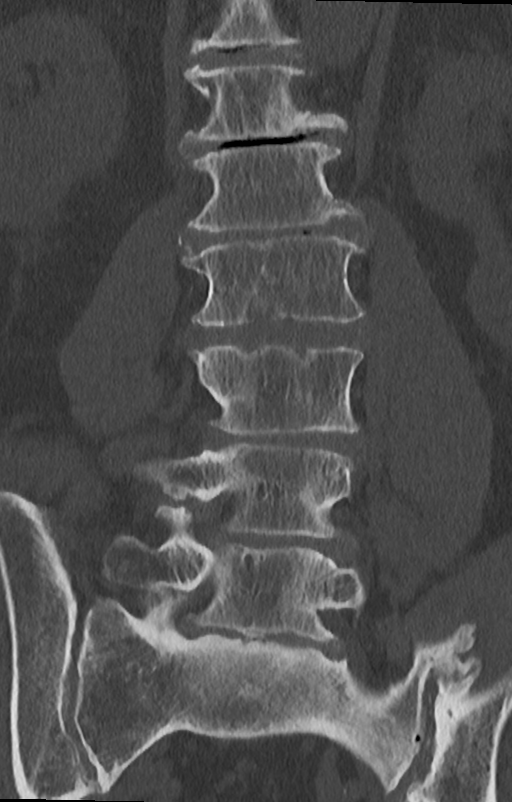
[im 53/89  bone]
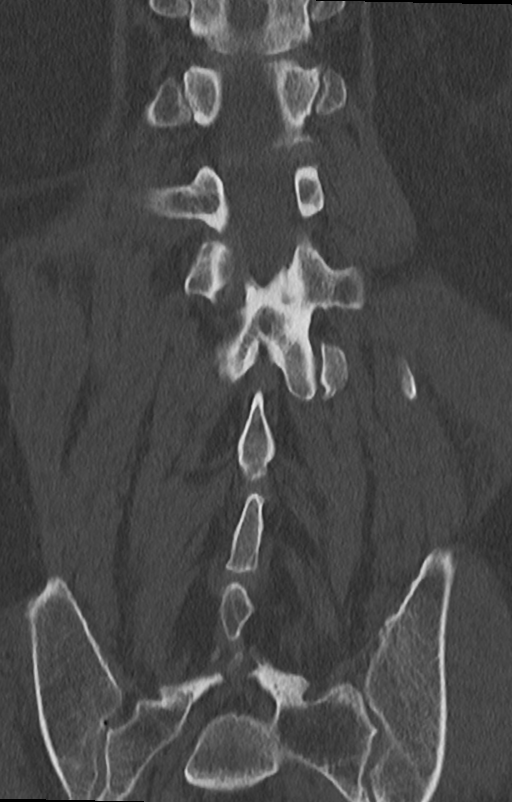

[12 of 33 positions shown; findings below may reference images not displayed]

FINDINGS: Segmentation: Normal.

Alignment: Stable straightening of lumbar lordosis as seen on
radiographs in [REDACTED]. There is mild degenerative retrolisthesis of L5
on S1. Mild levoconvex lumbar scoliosis.

Vertebrae: No acute osseous abnormality identified. Intact visible
sacrum and SI joints, with moderate to severe degenerative sacral
osteophytosis which is greater on the left (series 4, image 119).

Paraspinal and other soft tissues: Several small 4-5 mm gallstones
are evident (series 3, image 6). Punctate right lower pole
nephrolithiasis. Mild Aortoiliac calcified atherosclerosis. Negative
lumbar paraspinal soft tissues.

Disc levels:

T11-T12: Preserved disc space height with mild mostly anterior and
lateral endplate spurring. No stenosis.

T12-L1: Disc space loss with vacuum disc. Mild circumferential disc
osteophyte complex. Borderline to mild left T12 foraminal stenosis.

L1-L2: Disc space loss with mild vacuum disc. Circumferential disc
osteophyte complex. No convincing spinal stenosis. Borderline to
mild L1 foraminal stenosis mostly on the right.

L2-L3: Disc space loss with circumferential disc bulge and endplate
spurring. Up to mild spinal stenosis. Mild to moderate L2 foraminal
stenosis greater on the left.

L3-L4: Mild vacuum disc. Circumferential disc bulge and endplate
spurring eccentric to the right. Mild posterior element hypertrophy.
No significant spinal stenosis. Up to mild left and moderate right
L3 foraminal stenosis.

L4-L5: Circumferential disc bulge eccentric to the left. Endplate
spurring. Moderate posterior element hypertrophy, including vacuum
facet on the right. No significant spinal stenosis, although
leftward disc probably results in mild left lateral recess stenosis
(left L5 nerve level series 3, image 87). Moderate left and moderate
to severe right L4 foraminal stenosis.

L5-S1: Disc space loss with vacuum disc. Circumferential disc
osteophyte complex. Mild facet hypertrophy greater on the left. Mild
epidural lipomatosis, no spinal or lateral recess stenosis. Moderate
bilateral L5 foraminal stenosis.
IMPRESSION: 1. No acute or suspicious osseous abnormality in the lumbar spine.

2. Advanced disc and endplate degeneration, especially T12-L1,
L1-L2, L5-S1 where there is vacuum disc. But no significant spinal
stenosis at those levels.

3. Mild multifactorial spinal stenosis suspected at L2-L3.
Mild left lateral recess stenosis suspected at L4-L5 (left L5 nerve
level).
Moderate neural foraminal stenosis at the left L2, right L3,
bilateral L4 (severe on the right) and bilateral L5 nerve levels.

4. Cholelithiasis and right nephrolithiasis. Aortic Atherosclerosis
([id number]).

## 2020-11-04 DIAGNOSIS — Z950 Presence of cardiac pacemaker: Secondary | ICD-10-CM | POA: Diagnosis not present

## 2020-11-04 DIAGNOSIS — Z45018 Encounter for adjustment and management of other part of cardiac pacemaker: Secondary | ICD-10-CM | POA: Diagnosis not present

## 2020-11-04 DIAGNOSIS — I495 Sick sinus syndrome: Secondary | ICD-10-CM | POA: Diagnosis not present

## 2020-11-28 ENCOUNTER — Other Ambulatory Visit: Payer: Self-pay | Admitting: Cardiology

## 2020-12-17 ENCOUNTER — Encounter: Payer: Self-pay | Admitting: Cardiology

## 2020-12-17 ENCOUNTER — Other Ambulatory Visit: Payer: Self-pay

## 2020-12-17 ENCOUNTER — Ambulatory Visit: Payer: Medicare Other | Admitting: Cardiology

## 2020-12-17 VITALS — BP 127/81 | HR 90 | Temp 97.7°F | Resp 16 | Ht 69.0 in | Wt 224.2 lb

## 2020-12-17 DIAGNOSIS — I10 Essential (primary) hypertension: Secondary | ICD-10-CM

## 2020-12-17 DIAGNOSIS — G4733 Obstructive sleep apnea (adult) (pediatric): Secondary | ICD-10-CM

## 2020-12-17 DIAGNOSIS — I4729 Other ventricular tachycardia: Secondary | ICD-10-CM

## 2020-12-17 DIAGNOSIS — Z950 Presence of cardiac pacemaker: Secondary | ICD-10-CM

## 2020-12-17 DIAGNOSIS — Z953 Presence of xenogenic heart valve: Secondary | ICD-10-CM | POA: Diagnosis not present

## 2020-12-17 DIAGNOSIS — I472 Ventricular tachycardia: Secondary | ICD-10-CM
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
## 2020-12-30 ENCOUNTER — Other Ambulatory Visit: Payer: Self-pay | Admitting: Cardiology

## 2021-01-05 ENCOUNTER — Encounter: Payer: Medicare Other | Admitting: Cardiology

## 2021-01-05 ENCOUNTER — Encounter: Payer: Self-pay | Admitting: Cardiology

## 2021-01-05 ENCOUNTER — Other Ambulatory Visit: Payer: Self-pay

## 2021-01-05 ENCOUNTER — Ambulatory Visit: Payer: Medicare Other | Admitting: Cardiology

## 2021-01-05 DIAGNOSIS — Z45018 Encounter for adjustment and management of other part of cardiac pacemaker: Secondary | ICD-10-CM

## 2021-01-05 DIAGNOSIS — Z9989 Dependence on other enabling machines and devices: Secondary | ICD-10-CM

## 2021-01-05 DIAGNOSIS — Z950 Presence of cardiac pacemaker: Secondary | ICD-10-CM | POA: Diagnosis not present

## 2021-01-05 DIAGNOSIS — I495 Sick sinus syndrome: Secondary | ICD-10-CM | POA: Diagnosis not present

## 2021-01-05 DIAGNOSIS — G4733 Obstructive sleep apnea (adult) (pediatric): Secondary | ICD-10-CM

## 2021-01-05 NOTE — Progress Notes (Signed)
Chief Complaint  Patient presents with  . Pacemaker Check    Patient wanting to wear a CPAP machine with the mask that is very small magnets on either end of the mouthpiece.       ICD-10-CM   1. Encounter for care of pacemaker  Z45.018   2. 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  Z95.0   3. Sinus node dysfunction (HCC)  I49.5   4. OSA on CPAP  G47.33    Z99.89    Scheduled Remote pacemaker check 10/20/2020:  There were 30 atrial high rate episodes detected. The longest lasted 00:00:00:08 in duration. Review of EGMs demonstrates false mode switch and PAT. There was a <1 % cumulative atrial arrhythmia burden. There were 5 ventricular high rate episodes detected. Review of EGMs reveals nonsustained VT with intermittent retrograde VA conduction (the longest episode lasted 17 beats)  and a brief episode of SVT (chronic and stable) Battery longevity is 6.0 years. RA pacing is 9.0 %, RV pacing is <1.0 %. Stable from previous.   Scheduled  In office pacemaker check 01/05/21  Single (S)/Dual (D)/BV: D. Presenting ASVS. Pacemaker dependant:  No. Underlying NSR. AP 14%, VP <1%.  AMS Episodes 21.  AT/AF burden <1% . Longest 8 Sec. L. HVR 5.  Brief NSVT. Longevity 7.4 Years. Magnet rate: >85%. Lead measurements: Stable.Marland Kitchen Histogram: Low (L)/normal (N)/high (H)  Normal. Patient activity Normal.   Observations: Normal pacemaker function. Changes: None.   Patient has been diagnosed with obstructive sleep apnea and he will be using a mask that has very small magnets and that should probably not interfere with his pacemaker.  However we will continue to remotely monitor and if his ventricular pacing limit increases, patient may have to choose a different kind of mask.    Adrian Prows, MD, [facility] 01/05/2021, 6:36 PM Office: [phone] Pager: [phone]

## 2021-01-20 DIAGNOSIS — R739 Hyperglycemia, unspecified: Secondary | ICD-10-CM | POA: Diagnosis not present

## 2021-01-20 DIAGNOSIS — M545 Low back pain, unspecified: Secondary | ICD-10-CM | POA: Diagnosis not present

## 2021-01-20 DIAGNOSIS — I10 Essential (primary) hypertension: Secondary | ICD-10-CM | POA: Diagnosis not present

## 2021-01-20 DIAGNOSIS — E782 Mixed hyperlipidemia: Secondary | ICD-10-CM | POA: Diagnosis not present

## 2021-01-20 DIAGNOSIS — G8929 Other chronic pain: Secondary | ICD-10-CM | POA: Diagnosis not present

## 2021-01-20 DIAGNOSIS — Z87898 Personal history of other specified conditions: Secondary | ICD-10-CM | POA: Diagnosis not present

## 2021-01-29 ENCOUNTER — Ambulatory Visit: Payer: Medicare Other | Admitting: Cardiology

## 2021-02-03 DIAGNOSIS — I495 Sick sinus syndrome: Secondary | ICD-10-CM | POA: Diagnosis not present

## 2021-02-03 DIAGNOSIS — Z45010 Encounter for checking and testing of cardiac pacemaker pulse generator [battery]: Secondary | ICD-10-CM | POA: Diagnosis not present

## 2021-02-03 DIAGNOSIS — Z45018 Encounter for adjustment and management of other part of cardiac pacemaker: Secondary | ICD-10-CM | POA: Diagnosis not present

## 2021-02-03 DIAGNOSIS — Z950 Presence of cardiac pacemaker: Secondary | ICD-10-CM | POA: Diagnosis not present

## 2021-02-10 ENCOUNTER — Other Ambulatory Visit: Payer: Self-pay | Admitting: Physical Medicine and Rehabilitation

## 2021-02-10 ENCOUNTER — Telehealth: Payer: Self-pay

## 2021-02-10 DIAGNOSIS — M5441 Lumbago with sciatica, right side: Secondary | ICD-10-CM

## 2021-02-10 DIAGNOSIS — G8929 Other chronic pain: Secondary | ICD-10-CM

## 2021-02-10 DIAGNOSIS — G894 Chronic pain syndrome: Secondary | ICD-10-CM

## 2021-02-10 DIAGNOSIS — M5116 Intervertebral disc disorders with radiculopathy, lumbar region: Secondary | ICD-10-CM

## 2021-02-10 NOTE — Telephone Encounter (Signed)
Pt stop in to ask for a referral to PT for his lower back.

## 2021-02-10 NOTE — Progress Notes (Signed)
Consult PT for Eval and Treat and HEP

## 2021-02-10 NOTE — Telephone Encounter (Signed)
PT referral placed.

## 2021-02-11 ENCOUNTER — Other Ambulatory Visit: Payer: Self-pay | Admitting: Cardiology

## 2021-02-11 NOTE — Telephone Encounter (Signed)
Called pt and informed him about PT referral.

## 2021-02-11 NOTE — Telephone Encounter (Signed)
Is this a medication we can fill? Please advise.

## 2021-02-16 DIAGNOSIS — I10 Essential (primary) hypertension: Secondary | ICD-10-CM | POA: Diagnosis not present

## 2021-02-16 DIAGNOSIS — R739 Hyperglycemia, unspecified: Secondary | ICD-10-CM | POA: Diagnosis not present

## 2021-02-16 DIAGNOSIS — E782 Mixed hyperlipidemia: Secondary | ICD-10-CM | POA: Diagnosis not present

## 2021-02-17 DIAGNOSIS — G4733 Obstructive sleep apnea (adult) (pediatric): Secondary | ICD-10-CM | POA: Diagnosis not present

## 2021-02-19 ENCOUNTER — Ambulatory Visit: Payer: Medicare Other | Admitting: Adult Health

## 2021-02-23 ENCOUNTER — Encounter: Payer: Self-pay | Admitting: Physical Therapy

## 2021-02-23 ENCOUNTER — Other Ambulatory Visit: Payer: Self-pay

## 2021-02-23 ENCOUNTER — Ambulatory Visit (INDEPENDENT_AMBULATORY_CARE_PROVIDER_SITE_OTHER): Payer: Medicare Other | Admitting: Physical Therapy

## 2021-02-23 DIAGNOSIS — G8929 Other chronic pain: Secondary | ICD-10-CM

## 2021-02-23 DIAGNOSIS — R262 Difficulty in walking, not elsewhere classified: Secondary | ICD-10-CM

## 2021-02-23 DIAGNOSIS — M6281 Muscle weakness (generalized): Secondary | ICD-10-CM | POA: Diagnosis not present

## 2021-02-23 DIAGNOSIS — M545 Low back pain, unspecified: Secondary | ICD-10-CM | POA: Diagnosis not present

## 2021-02-23 NOTE — Patient Instructions (Signed)
Access Code: [email or web address] URL: [email or web address] Date: 02/23/2021 Prepared by: Elsie Ra  Exercises Seated Sciatic Tensioner - 2 x daily - 6 x weekly - 10 reps - 1 sets - 3 hold Seated Hamstring Stretch - 2 x daily - 6 x weekly - 1 sets - 5 reps - 10 hold Cat-Camel - 2 x daily - 6 x weekly - 10 reps - 1 sets - 5 hold Child's Pose Stretch - 2 x daily - 6 x weekly - 3 sets - 30 hold Supine Quadriceps Stretch with Strap on Table - 2 x daily - 6 x weekly - 2-3 reps - 30 hold Supine Bridge - 2 x daily - 6 x weekly - 10 reps - 1-2 sets - 5 hold

## 2021-02-23 NOTE — Therapy (Addendum)
[facility] [address] [place], [place], [address] Phone: [phone]   Fax:  [phone]  Physical Therapy Evaluation/Discharge  Patient Details  Name: Manuel Escobar MRN: [id number] Date of Birth: 09/10/1946 Referring Provider (PT): Ernestina Patches, MD   Encounter Date: 02/23/2021   PT End of Session - 02/23/21 1409     Visit Number 1    Number of Visits 10    Date for PT Re-Evaluation 05/18/21    PT Start Time 1300    PT Stop Time 1540    PT Time Calculation (min) 45 min             Past Medical History:  Diagnosis Date   Alcoholism ([place])    Anxiety    Aortic stenosis 08/14/2017   Cardiac syncope 04/28/2019   Depression    Encounter for care of pacemaker 04/28/2019   History of pacemaker    Hypertension    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 04/11/2013   Sinus node dysfunction ([place]) 04/28/2019    Past Surgical History:  Procedure Laterality Date   APPENDECTOMY     INTRAVASCULAR PRESSURE WIRE/FFR STUDY N/A 08/30/2017   Procedure: INTRAVASCULAR PRESSURE WIRE/FFR STUDY;  Surgeon: Nigel Mormon, MD;  Location: [place] [facility];  Service: Cardiovascular;  Laterality: N/A;   PACEMAKER INSERTION  04/13/2014   PROSTATECTOMY     RIGHT/LEFT HEART CATH AND CORONARY ANGIOGRAPHY N/A 08/30/2017   Procedure: RIGHT/LEFT HEART CATH AND CORONARY ANGIOGRAPHY;  Surgeon: Nigel Mormon, MD;  Location: [place] [facility];  Service: Cardiovascular;  Laterality: N/A;   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   Scalp   TEE WITHOUT CARDIOVERSION N/A 08/18/2017   Procedure: TRANSESOPHAGEAL ECHOCARDIOGRAM (TEE);  Surgeon: Adrian Prows, MD;  Location: [place];  Service: Cardiovascular;  Laterality: N/A;    There were no vitals filed for this visit.    Subjective Assessment - 02/23/21 1303     Subjective He relays chronic LBP. He was a physician. He relays chronic tightness in his hamstrings and calves  that do not improve with stretching, it tighthens back up after 30 minutes. He relays he used to run marathons. He denies weaknes or N/T today but his back pain does radiate into his buttocks and hamstings    Pertinent History PMH: pacemaker,  anx,dep,chroinc pain syndrome    Diagnostic tests From chart CT lumbar 05/12/20 impression "Advanced disc and endplate degeneration, especially T12-L1,L1-L2, L5-S1 where there is vacuum disc. But no significant spinalstenosis at those levels. Mild multifactorial spinal stenosis suspected at L2-L3.Mild left lateral recess stenosis suspected at L4-L5 (left L5 nervelevel). Moderate neural foraminal stenosis at the left L2, right L3, bilateral L4 (severe on the right) and bilateral L5 nerve levels.    Patient Stated Goals improve pain    Currently in Pain? Yes    Pain Score 4     Pain Location Back    Pain Orientation Lower    Pain Descriptors / Indicators Aching;Dull    Pain Onset More than a month ago    Pain Frequency Intermittent    Aggravating Factors  prolonged walking    Pain Relieving Factors stretching    Multiple Pain Sites No                [facility] PT Assessment - 02/23/21 0001       Assessment   Medical Diagnosis Chronic LBP    Referring Provider (PT) Ernestina Patches, MD    Onset Date/Surgical  Date --   chronic pain for last 2 years   Next MD Visit nothing set up    Prior Therapy nothing recent      Precautions   Precautions None      Restrictions   Weight Bearing Restrictions No      Balance Screen   Has the patient fallen in the past 6 months No    Has the patient had a decrease in activity level because of a fear of falling?  No    Is the patient reluctant to leave their home because of a fear of falling?  No      Home Ecologist residence      Prior Function   Level of Independence Independent    Vocation Retired    Biomedical scientist retired Stage manager    Leisure ride stationary bike, walk       Cognition   Overall Cognitive Status Within Functional Limits for tasks assessed      Observation/Other Assessments   Focus on Therapeutic Outcomes (FOTO)  56% functional intake, goal is 60%      Posture/Postural Control   Posture Comments mildly slumped posture      ROM / Strength   AROM / PROM / Strength AROM;Strength      AROM   Overall AROM Comments denies pain with lumbar AROM    AROM Assessment Site Lumbar    Lumbar Flexion WNL    Lumbar Extension 75%    Lumbar - Right Side Bend 50%    Lumbar - Left Side Bend 50%    Lumbar - Right Rotation WNL    Lumbar - Left Rotation WNL      Strength   Overall Strength Comments WNL leg strength bilat, decreased core strength      Flexibility   Soft Tissue Assessment /Muscle Length --   very tight quads and hamstrings     Palpation   Spinal mobility moderate restriction in lumbar PA glides      Special Tests   Other special tests + slump test bilat (Rt worse than Lt), negative SLR test bilat      Transfers   Transfers Independent with all Transfers      Ambulation/Gait   Gait Comments WFL                        Objective measurements completed on examination: See above findings.       [facility] PT Treatment/Exercise - 02/23/21 0001       Exercises   Exercises Lumbar      Lumbar Exercises: Stretches   Active Hamstring Stretch Right;Left;5 reps;10 seconds    Active Hamstring Stretch Limitations MET contract relax in sitting with 5 sec H.S. set in between    Other Lumbar Stretch Exercise sciatic nerve glide 3 sec X10 bilat    Other Lumbar Stretch Exercise child pose 30 sec X1      Lumbar Exercises: Quadruped   Madcat/Old Horse 5 reps    Madcat/Old Horse Limitations 5 sec holds      Manual Therapy   Manual therapy comments lumbar central PA mobs grade 2-3                    PT Education - 02/23/21 1409     Education Details HEP,POC,exam findings    Person(s) Educated Patient     Methods Explanation;Demonstration;Verbal cues;Handout    Comprehension Verbalized understanding;Returned demonstration;Need  further instruction              PT Short Term Goals - 02/23/21 1417       PT SHORT TERM GOAL #1   Title Pt will be I and compliant with HEP.    Time 4    Period Weeks    Status New    Target Date 03/23/21               PT Long Term Goals - 02/23/21 1418       PT LONG TERM GOAL #1   Title Pt will improve FOTO to at least 60% functional outcome    Time 12    Period Weeks    Status New    Target Date 05/18/21      PT LONG TERM GOAL #2   Title Pt will improve bilat hamstring tighntess to at least 75 deg to improve function    Baseline about 50 degrees    Time 12    Period Weeks    Status New      PT LONG TERM GOAL #3   Title Pt will report decreased overall tightness and pain with community ambulation.    Time 12    Period Weeks    Status New                    Plan - 02/23/21 1410     Clinical Impression Statement Pt presents with chronic lumbar pain with mobility deficits and LE tightness with incresaed LE neural tension. He has pain that radiates into his buttocks and hamstrings but did not have radicular presentation today. He would benefit from skilled PT to address his functional deficits listed below. He was given HEP to trial and will follow up with PT PRN.    Personal Factors and Comorbidities Comorbidity 3+    Comorbidities PMH: pacemaker,  anx,dep,chroinc pain syndrome    Examination-Activity Limitations [place];Carry;Lift;Stand;Stairs;Squat;Locomotion Level    Examination-Participation Restrictions Cleaning;Community Activity    Stability/Clinical Decision Making Evolving/Moderate complexity    Clinical Decision Making Moderate    Rehab Potential Good    PT Frequency 1x / week   1 every 1-2 weeks   PT Duration 12 weeks    PT Treatment/Interventions ADLs/Self Care Home Management;Cryotherapy;Moist  Heat;Traction;Therapeutic activities;Therapeutic exercise;Balance training;Neuromuscular re-education;Manual techniques;Passive range of motion;Dry needling;Joint Manipulations;Spinal Manipulations;Taping    PT Next Visit Plan has pacemaker so no electric modalaties. how was HEP, progress LE flexability, lumbar mobillity, and core strength as tolerated.    PT Home Exercise Plan Access Code: [email or web address]    Consulted and Agree with Plan of Care Patient             Patient will benefit from skilled therapeutic intervention in order to improve the following deficits and impairments:  Decreased activity tolerance,Decreased mobility,Decreased range of motion,Decreased strength,Difficulty walking,Impaired flexibility,Postural dysfunction,Pain,Improper body mechanics  Visit Diagnosis: Chronic bilateral low back pain, unspecified whether sciatica present  Difficulty in walking, not elsewhere classified  Muscle weakness (generalized)     Problem List Patient Active Problem List   Diagnosis Date Noted   Chronic intermittent hypoxia with obstructive sleep apnea 09/14/2020   Chronic fatigue syndrome with fibromyalgia 08/28/2020   Witnessed episode of apnea 08/28/2020   Non-restorative sleep 08/28/2020   Severe obstructive sleep apnea-hypopnea syndrome 08/28/2020   Cardiac syncope 04/28/2019   Sinus node dysfunction ([place]) 04/28/2019   Encounter for care of pacemaker 04/28/2019   Aortic stenosis 08/14/2017   Pacemaker St. Jude  Accent DR 8119 dual-chamber pacemaker in situ 04/11/2013    Silvestre Mesi  02/23/2021, 2:21 PM  PHYSICAL THERAPY DISCHARGE SUMMARY  Visits from Start of Care: 1  Current functional level related to goals / functional outcomes: See note   Remaining deficits: See note   Education / Equipment: HEP   Patient agrees to discharge. Patient goals were partially met. Patient is being discharged due to not returning since the last visit.  Scot Jun, PT,  DPT, OCS, ATC 07/08/21  3:50 PM     [facility] [address] [place], [place], [address] Phone: [phone]   Fax:  [phone]  Name: AIDYN KELLIS MRN: [id number] Date of Birth: August 01, 1946

## 2021-03-03 ENCOUNTER — Other Ambulatory Visit: Payer: Self-pay | Admitting: Cardiology

## 2021-03-19 DIAGNOSIS — E782 Mixed hyperlipidemia: Secondary | ICD-10-CM | POA: Diagnosis not present

## 2021-03-19 DIAGNOSIS — G4700 Insomnia, unspecified: Secondary | ICD-10-CM | POA: Diagnosis not present

## 2021-03-19 DIAGNOSIS — E7800 Pure hypercholesterolemia, unspecified: Secondary | ICD-10-CM | POA: Diagnosis not present

## 2021-03-19 DIAGNOSIS — G8929 Other chronic pain: Secondary | ICD-10-CM | POA: Diagnosis not present

## 2021-03-19 DIAGNOSIS — I10 Essential (primary) hypertension: Secondary | ICD-10-CM | POA: Diagnosis not present

## 2021-04-05 ENCOUNTER — Other Ambulatory Visit: Payer: Self-pay | Admitting: Physical Medicine and Rehabilitation

## 2021-04-05 DIAGNOSIS — M5442 Lumbago with sciatica, left side: Secondary | ICD-10-CM

## 2021-04-05 DIAGNOSIS — M5116 Intervertebral disc disorders with radiculopathy, lumbar region: Secondary | ICD-10-CM

## 2021-04-05 DIAGNOSIS — G8929 Other chronic pain: Secondary | ICD-10-CM

## 2021-04-06 ENCOUNTER — Other Ambulatory Visit: Payer: Self-pay | Admitting: Cardiology

## 2021-04-06 ENCOUNTER — Telehealth: Payer: Self-pay | Admitting: Cardiology

## 2021-04-06 DIAGNOSIS — M797 Fibromyalgia: Secondary | ICD-10-CM

## 2021-04-06 DIAGNOSIS — G9332 Myalgic encephalomyelitis/chronic fatigue syndrome: Secondary | ICD-10-CM

## 2021-04-06 MED ORDER — GABAPENTIN 600 MG PO TABS: 800.0000 mg | ORAL_TABLET | Freq: Three times a day (TID) | ORAL | Status: DC

## 2021-04-06 MED ORDER — PREGABALIN 150 MG PO CAPS: 150.0000 mg | ORAL_CAPSULE | Freq: Two times a day (BID) | ORAL | 2 refills | Status: DC

## 2021-04-06 NOTE — Telephone Encounter (Signed)
Chief Complaint  Patient presents with   Fibromyalgia    Request  Lyrica    ICD-10-CM   1. Chronic fatigue syndrome with fibromyalgia  R53.82 pregabalin (LYRICA) 150 MG capsule   M79.7      Meds ordered this encounter  Medications   pregabalin (LYRICA) 150 MG capsule    Sig: Take 1 capsule (150 mg total) by mouth 2 (two) times daily.    Dispense:  60 capsule    Refill:  2    Discontinue Gabapentin    Medications Discontinued During This Encounter  Medication Reason   gabapentin (NEURONTIN) 600 MG tablet Change in therapy     Will forward this note to his PCP as well.    Adrian Prows, MD, [facility] 04/06/2021, 5:15 PM Office: [phone] Fax: [phone] Pager: [phone]

## 2021-05-05 DIAGNOSIS — Z45018 Encounter for adjustment and management of other part of cardiac pacemaker: Secondary | ICD-10-CM | POA: Diagnosis not present

## 2021-05-05 DIAGNOSIS — I495 Sick sinus syndrome: Secondary | ICD-10-CM | POA: Diagnosis not present

## 2021-05-05 DIAGNOSIS — Z950 Presence of cardiac pacemaker: Secondary | ICD-10-CM | POA: Diagnosis not present

## 2021-05-06 NOTE — Telephone Encounter (Signed)
Received a notification from [facility]) and the patient has returned his machine.  "patient Manuel Escobar d.o.b  [birth year] and BT# [id number] has returned his Resmed 11 equipment today.  He states that he doesn't need the machine anymore and he is sleeping much better without the machine.  Patient has spoken with Jarrett Soho on several occasions and has changed masks in the past."

## 2021-05-12 DIAGNOSIS — G4700 Insomnia, unspecified: Secondary | ICD-10-CM | POA: Diagnosis not present

## 2021-05-12 DIAGNOSIS — E7800 Pure hypercholesterolemia, unspecified: Secondary | ICD-10-CM | POA: Diagnosis not present

## 2021-05-12 DIAGNOSIS — I10 Essential (primary) hypertension: Secondary | ICD-10-CM | POA: Diagnosis not present

## 2021-05-12 DIAGNOSIS — E782 Mixed hyperlipidemia: Secondary | ICD-10-CM | POA: Diagnosis not present

## 2021-05-12 DIAGNOSIS — G8929 Other chronic pain: Secondary | ICD-10-CM | POA: Diagnosis not present

## 2021-07-09 ENCOUNTER — Other Ambulatory Visit: Payer: Self-pay

## 2021-07-09 ENCOUNTER — Other Ambulatory Visit: Payer: Self-pay | Admitting: Cardiology

## 2021-07-09 DIAGNOSIS — G4700 Insomnia, unspecified: Secondary | ICD-10-CM | POA: Diagnosis not present

## 2021-07-09 DIAGNOSIS — M797 Fibromyalgia: Secondary | ICD-10-CM

## 2021-07-09 DIAGNOSIS — E782 Mixed hyperlipidemia: Secondary | ICD-10-CM | POA: Diagnosis not present

## 2021-07-09 DIAGNOSIS — I10 Essential (primary) hypertension: Secondary | ICD-10-CM | POA: Diagnosis not present

## 2021-07-09 DIAGNOSIS — G8929 Other chronic pain: Secondary | ICD-10-CM | POA: Diagnosis not present

## 2021-07-09 DIAGNOSIS — E7800 Pure hypercholesterolemia, unspecified: Secondary | ICD-10-CM | POA: Diagnosis not present

## 2021-07-09 DIAGNOSIS — C61 Malignant neoplasm of prostate: Secondary | ICD-10-CM | POA: Diagnosis not present

## 2021-07-09 DIAGNOSIS — Z8546 Personal history of malignant neoplasm of prostate: Secondary | ICD-10-CM | POA: Diagnosis not present

## 2021-07-09 MED ORDER — PREGABALIN 150 MG PO CAPS: ORAL_CAPSULE | ORAL | 6 refills | Status: DC

## 2021-08-19 DIAGNOSIS — I10 Essential (primary) hypertension: Secondary | ICD-10-CM | POA: Diagnosis not present

## 2021-08-19 DIAGNOSIS — Z0000 Encounter for general adult medical examination without abnormal findings: Secondary | ICD-10-CM | POA: Diagnosis not present

## 2021-08-19 DIAGNOSIS — Z8546 Personal history of malignant neoplasm of prostate: Secondary | ICD-10-CM | POA: Diagnosis not present

## 2021-08-19 DIAGNOSIS — Z23 Encounter for immunization: Secondary | ICD-10-CM | POA: Diagnosis not present

## 2021-08-21 DIAGNOSIS — Z45018 Encounter for adjustment and management of other part of cardiac pacemaker: Secondary | ICD-10-CM | POA: Diagnosis not present

## 2021-08-21 DIAGNOSIS — I495 Sick sinus syndrome: Secondary | ICD-10-CM | POA: Diagnosis not present

## 2021-09-02 ENCOUNTER — Other Ambulatory Visit: Payer: Self-pay | Admitting: Cardiology

## 2021-09-02 DIAGNOSIS — Z8546 Personal history of malignant neoplasm of prostate: Secondary | ICD-10-CM | POA: Diagnosis not present

## 2021-09-02 DIAGNOSIS — Z0000 Encounter for general adult medical examination without abnormal findings: Secondary | ICD-10-CM | POA: Diagnosis not present

## 2021-09-02 DIAGNOSIS — I10 Essential (primary) hypertension: Secondary | ICD-10-CM | POA: Diagnosis not present

## 2021-09-25 DIAGNOSIS — E871 Hypo-osmolality and hyponatremia: Secondary | ICD-10-CM | POA: Diagnosis not present

## 2021-09-25 DIAGNOSIS — D649 Anemia, unspecified: Secondary | ICD-10-CM | POA: Diagnosis not present

## 2021-09-25 DIAGNOSIS — U071 COVID-19: Secondary | ICD-10-CM | POA: Diagnosis not present

## 2021-10-20 DIAGNOSIS — Z23 Encounter for immunization: Secondary | ICD-10-CM | POA: Diagnosis not present

## 2021-11-20 DIAGNOSIS — Z45018 Encounter for adjustment and management of other part of cardiac pacemaker: Secondary | ICD-10-CM | POA: Diagnosis not present

## 2021-11-20 DIAGNOSIS — I495 Sick sinus syndrome: Secondary | ICD-10-CM | POA: Diagnosis not present

## 2021-12-10 DIAGNOSIS — H10413 Chronic giant papillary conjunctivitis, bilateral: Secondary | ICD-10-CM | POA: Diagnosis not present

## 2021-12-17 ENCOUNTER — Encounter: Payer: Self-pay | Admitting: Cardiology

## 2021-12-17 ENCOUNTER — Ambulatory Visit: Payer: Medicare Other | Admitting: Cardiology

## 2021-12-17 ENCOUNTER — Other Ambulatory Visit: Payer: Self-pay

## 2021-12-17 VITALS — BP 116/80 | HR 90 | Temp 97.8°F | Resp 16 | Ht 69.0 in | Wt 239.4 lb

## 2021-12-17 DIAGNOSIS — Z9989 Dependence on other enabling machines and devices: Secondary | ICD-10-CM | POA: Diagnosis not present

## 2021-12-17 DIAGNOSIS — Z950 Presence of cardiac pacemaker: Secondary | ICD-10-CM

## 2021-12-17 DIAGNOSIS — I10 Essential (primary) hypertension: Secondary | ICD-10-CM

## 2021-12-17 DIAGNOSIS — Z953 Presence of xenogenic heart valve: Secondary | ICD-10-CM

## 2021-12-17 DIAGNOSIS — I4729 Other ventricular tachycardia: Secondary | ICD-10-CM

## 2021-12-17 DIAGNOSIS — G4733 Obstructive sleep apnea (adult) (pediatric): Secondary | ICD-10-CM
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
## 2022-01-07 DIAGNOSIS — E782 Mixed hyperlipidemia: Secondary | ICD-10-CM | POA: Diagnosis not present

## 2022-01-07 DIAGNOSIS — G8929 Other chronic pain: Secondary | ICD-10-CM | POA: Diagnosis not present

## 2022-01-07 DIAGNOSIS — G4700 Insomnia, unspecified: Secondary | ICD-10-CM | POA: Diagnosis not present

## 2022-01-07 DIAGNOSIS — I10 Essential (primary) hypertension: Secondary | ICD-10-CM | POA: Diagnosis not present

## 2022-01-26 ENCOUNTER — Other Ambulatory Visit: Payer: Self-pay | Admitting: Cardiology

## 2022-01-26 DIAGNOSIS — G9332 Myalgic encephalomyelitis/chronic fatigue syndrome: Secondary | ICD-10-CM

## 2022-01-26 MED ORDER — PREGABALIN 150 MG PO CAPS: ORAL_CAPSULE | ORAL | 3 refills | Status: DC

## 2022-01-29 DIAGNOSIS — R7303 Prediabetes: Secondary | ICD-10-CM | POA: Diagnosis not present

## 2022-02-02 DIAGNOSIS — Z8546 Personal history of malignant neoplasm of prostate: Secondary | ICD-10-CM | POA: Diagnosis not present

## 2022-02-02 DIAGNOSIS — D649 Anemia, unspecified: Secondary | ICD-10-CM | POA: Diagnosis not present

## 2022-02-02 DIAGNOSIS — I10 Essential (primary) hypertension: Secondary | ICD-10-CM | POA: Diagnosis not present

## 2022-02-09 DIAGNOSIS — Z8546 Personal history of malignant neoplasm of prostate: Secondary | ICD-10-CM | POA: Diagnosis not present

## 2022-02-12 ENCOUNTER — Other Ambulatory Visit (HOSPITAL_COMMUNITY): Payer: Self-pay | Admitting: Urology

## 2022-02-12 DIAGNOSIS — C61 Malignant neoplasm of prostate: Secondary | ICD-10-CM

## 2022-02-16 ENCOUNTER — Encounter: Payer: Self-pay | Admitting: Internal Medicine

## 2022-02-17 ENCOUNTER — Ambulatory Visit (HOSPITAL_COMMUNITY)
Admission: RE | Admit: 2022-02-17 | Discharge: 2022-02-17 | Disposition: A | Discharge status: Home / Self Care | Payer: Medicare Other | Source: Ambulatory Visit | Attending: Urology | Admitting: Urology

## 2022-02-17 DIAGNOSIS — C61 Malignant neoplasm of prostate: Secondary | ICD-10-CM | POA: Insufficient documentation

## 2022-02-17 DIAGNOSIS — K8020 Calculus of gallbladder without cholecystitis without obstruction: Secondary | ICD-10-CM | POA: Diagnosis not present

## 2022-02-17 DIAGNOSIS — I2510 Atherosclerotic heart disease of native coronary artery without angina pectoris: Secondary | ICD-10-CM | POA: Diagnosis not present

## 2022-02-17 DIAGNOSIS — R972 Elevated prostate specific antigen [PSA]: Secondary | ICD-10-CM | POA: Diagnosis not present

## 2022-02-17 DIAGNOSIS — K449 Diaphragmatic hernia without obstruction or gangrene: Secondary | ICD-10-CM | POA: Diagnosis not present

## 2022-02-17 MED ORDER — PIFLIFOLASTAT F 18 (PYLARIFY) INJECTION
9.0000 | Freq: Once | INTRAVENOUS | Status: AC
  Administered 2022-02-17: 8.3 via INTRAVENOUS

## 2022-02-19 DIAGNOSIS — Z45018 Encounter for adjustment and management of other part of cardiac pacemaker: Secondary | ICD-10-CM | POA: Diagnosis not present

## 2022-02-19 DIAGNOSIS — I495 Sick sinus syndrome: Secondary | ICD-10-CM | POA: Diagnosis not present

## 2022-02-24 NOTE — Progress Notes (Signed)
GU Location of Tumor / Histology: Prostate Ca  If Prostate Cancer, Gleason Score is (3 + 3) and PSA is (0.7 as of 02/2022) ( 0.4  on 08/2021) (0.15 on 11/2019)  Biopsies  02/19/2022 NM PET COMPARISON:  None Available.   FINDINGS: NECK   No radiotracer activity in neck lymph nodes.   Incidental CT finding: No cervical adenopathy. Bilateral carotid atherosclerosis.   CHEST   No radiotracer accumulation within mediastinal or hilar lymph nodes.  No suspicious pulmonary nodules on the CT scan.   Incidental CT finding: Aortic and coronary artery calcification.  Aortic valve repair. Dual lead pacer. Mild cardiomegaly. Tiny hiatal hernia. Hypoventilation with resultant volume loss and air trapping in the lung bases.   ABDOMEN/PELVIS   Prostate: No focal activity in the prostate bed.   Lymph nodes: No abnormal radiotracer accumulation within pelvic or abdominal nodes.   Liver: No evidence of liver metastasis   Incidental CT finding: Hepatic cysts at up to 3.2 cm. 4 mm gallstone. Normal adrenal glands. Mild renal cortical thinning bilaterally. Normal noncontrast appearance of the pancreas, urinary bladder. Prostatectomy. Extensive colonic diverticulosis.   SKELETON No focal  activity to suggest skeletal metastasis. Degenerative partial fusion of bilateral sacroiliac joints. No worrisome sclerotic lesion.   IMPRESSION: 1. No tracer avid recurrent or metastatic disease, status post prostatectomy. 2. Incidental findings, including: Coronary artery atherosclerosis. Aortic Atherosclerosis (ICD10-I70.0). Cholelithiasis.    Past/Anticipated interventions by urology, if any: NA  Past/Anticipated interventions by medical oncology, if any: NA  Weight changes, if any:  No  IPSS:  0 Denies urinary issues. SHIM: reports erection problems.  Bowel/Bladder complaints, if any:  No  Nausea/Vomiting, if any: No  Pain issues, if any:  2/10 chronic back pain  SAFETY ISSUES: Prior radiation?   No Pacemaker/ICD?  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 (04/11/2013) Possible current pregnancy? Male  Is the patient on methotrexate? No  Current Complaints / other details:  Prostatectomy (02/12/2005)

## 2022-02-26 ENCOUNTER — Other Ambulatory Visit: Payer: Self-pay | Admitting: Cardiology

## 2022-02-28 NOTE — Progress Notes (Signed)
Radiation Oncology         [phone] ________________________________  Initial Outpatient Consultation  Name: Manuel Escobar MRN: [id number]  Date: 03/01/2022  DOB: Oct 20, 1945  CC:Aura Dials, MD  Irine Seal, MD   REFERRING PHYSICIAN: Irine Seal, MD  DIAGNOSIS: 76 y.o. 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    ICD-10-CM   1. 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 Florentina Addison 17 years ago for stage T2 Gleason 3+3 adenocarcinoma.  PSA was 0.15 on 11/19/19, then 0.44 on 09/02/21 and 0.7 on 02/03/22.  PSMA PET on 02/17/22 revealed no recurrent disease in the prostatic fossa or elsewhere.   The patient reviewed the PSA and PET results with his urologist and he has kindly been referred today for discussion of potential radiation treatment options.   PREVIOUS RADIATION THERAPY: No  PAST MEDICAL HISTORY:  Past Medical History:  Diagnosis Date   Alcoholism ([place])    Anxiety    Aortic stenosis 08/14/2017   Cardiac syncope 04/28/2019   Depression    Encounter for care of pacemaker 04/28/2019   History of pacemaker    Hypertension    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 04/11/2013   Sinus node dysfunction ([place]) 04/28/2019      PAST SURGICAL HISTORY: Past Surgical History:  Procedure Laterality Date   APPENDECTOMY     INTRAVASCULAR PRESSURE WIRE/FFR STUDY N/A 08/30/2017   Procedure: INTRAVASCULAR PRESSURE WIRE/FFR STUDY;  Surgeon: Nigel Mormon, MD;  Location: [facility];  Service: Cardiovascular;  Laterality: N/A;   PACEMAKER INSERTION  04/13/2014   PROSTATECTOMY     RIGHT/LEFT HEART CATH AND CORONARY ANGIOGRAPHY N/A 08/30/2017   Procedure: RIGHT/LEFT HEART CATH AND CORONARY ANGIOGRAPHY;  Surgeon:  Nigel Mormon, MD;  Location: [facility];  Service: Cardiovascular;  Laterality: N/A;   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   Scalp   TEE WITHOUT CARDIOVERSION N/A 08/18/2017   Procedure: TRANSESOPHAGEAL ECHOCARDIOGRAM (TEE);  Surgeon: Adrian Prows, MD;  Location[facility] END[facility];  Service: Cardiovascular;  Laterality: N/A;    FAMILY HISTORY:  Family History  Problem Relation Age of Onset   Heart attack Mother    Heart attack Father    Coronary artery disease Other    Stroke Other    Heart disease Sister     SOCIAL HISTORY:  Social History   Socioeconomic History   Marital status: Married    Spouse name: Not on file   Number of children: 0   Years of education: Not on file   Highest education level: Not on file  Occupational History   Not on file  Tobacco Use   Smoking status: Never   Smokeless tobacco: Never  Vaping Use   Vaping Use: Never used  Substance and Sexual Activity   Alcohol use: Not Currently    Comment: recovering alcholic 6 yrs sober   Drug use: No   Sexual activity: Not on file  Other Topics Concern   Not on file  Social History Narrative   Not on file   Social Determinants of Health   Financial Resource Strain: Not on file  Food Insecurity: Not on file  Transportation Needs: Not on file  Physical Activity:  Not on file  Stress: Not on file  Social Connections: Not on file  Intimate Partner Violence: Not on file    ALLERGIES: Patient has no known allergies.  MEDICATIONS:  Current Outpatient Medications  Medication Sig Dispense Refill   Ascorbic Acid (VITAMIN C) 1000 MG tablet Take 3,000 mg by mouth 2 (two) times daily.      atorvastatin (LIPITOR) 40 MG tablet Take 40 mg by mouth at bedtime.   5   buPROPion (WELLBUTRIN XL) 150 MG 24 hr tablet 1 tablet in the morning     DULoxetine (CYMBALTA) 60 MG capsule Take 2 capsules (120 mg total) by mouth daily.  3   gabapentin (NEURONTIN) 600 MG tablet Take 600 mg by mouth 3  (three) times daily.     Lysine 1000 MG TABS Take 3,000 mg by mouth 2 (two) times daily.     metaxalone (SKELAXIN) 800 MG tablet TAKE ONE TABLET THREE TIMES DAILY AS NEEDED 90 tablet 3   pregabalin (LYRICA) 150 MG capsule TAKE ONE CAPSULE TWICE A DAY 90 capsule 3   traZODone (DESYREL) 50 MG tablet Take 50 mg by mouth at bedtime as needed.     valsartan-hydrochlorothiazide (DIOVAN-HCT) 80-12.5 MG tablet TAKE ONE TABLET EVERY MORNING 90 tablet 1   chlorpheniramine-HYDROcodone 10-8 MG/5ML 5-10 mL as needed (Patient not taking: Reported on 03/01/2022)     No current facility-administered medications for this encounter.    REVIEW OF SYSTEMS:  On review of systems, the patient reports that he is doing well overall. He denies any chest pain, shortness of breath, cough, fevers, chills, night sweats, unintended weight changes. He denies any bowel disturbances, and denies abdominal pain, nausea or vomiting. He denies any new musculoskeletal or joint aches or pains. His IPSS was Total Score: 0, indicating mild urinary symptoms (Reference 0-7 mild, 8-19 moderate, 20-35 severe).  He reports erectile dysfunction. A complete review of systems is obtained and is otherwise negative.   PHYSICAL EXAM:  Wt Readings from Last 3 Encounters:  03/01/22 231 lb (104.8 kg)  12/17/21 239 lb 6.4 oz (108.6 kg)  12/17/20 224 lb 3.2 oz (101.7 kg)   Temp Readings from Last 3 Encounters:  03/01/22 97.8 F (36.6 C)  12/17/21 97.8 F (36.6 C) (Temporal)  12/17/20 97.7 F (36.5 C) (Temporal)   BP Readings from Last 3 Encounters:  03/01/22 104/79  12/17/21 116/80  12/17/20 127/81   Pulse Readings from Last 3 Encounters:  03/01/22 92  12/17/21 90  12/17/20 90   Pain Assessment Pain Score: 2  Pain Loc: Back (chronic back pain 40 plus years)/10  In general this is a well appearing gentleman in no acute distress. He's alert and oriented x4 and appropriate throughout the examination. Cardiopulmonary assessment is  negative for acute distress, and he exhibits normal effort.    KPS = 100  100 - Normal; no complaints; no evidence of disease. 90   - Able to carry on normal activity; minor signs or symptoms of disease. 80   - Normal activity with effort; some signs or symptoms of disease. 59   - Cares for self; unable to carry on normal activity or to do active work. 60   - Requires occasional assistance, but is able to care for most of his personal needs. 50   - Requires considerable assistance and frequent medical care. 26   - Disabled; requires special care and assistance. 34   - Severely disabled; hospital admission is indicated although death not imminent. 20   -  Very sick; hospital admission necessary; active supportive treatment necessary. 10   - Moribund; fatal processes progressing rapidly. 0     - Dead  Karnofsky DA, Abelmann [place], Craver LS and Burchenal JH [phone]) The use of the nitrogen mustards in the palliative treatment of carcinoma: with particular reference to bronchogenic carcinoma Cancer 1 634-56  LABORATORY DATA:  Lab Results  Component Value Date   WBC 6.5 11/01/2014   HGB 15.3 11/01/2014   HCT 45.0 11/01/2014   MCV 87.5 11/01/2014   PLT 265 11/01/2014   Lab Results  Component Value Date   NA 143 11/01/2014   K 3.8 11/01/2014   CL 101 11/01/2014   CO2 24 09/15/2014   Lab Results  Component Value Date   ALT 46 09/15/2014   AST 38 (H) 09/15/2014   ALKPHOS 80 09/15/2014   BILITOT 0.3 09/15/2014     RADIOGRAPHY: NM PET (PSMA) SKULL TO MID THIGH  Result Date: 02/19/2022 CLINICAL DATA:  Prostatectomy 17 years ago. No current treatment. Rising PSA, last 0.7. EXAM: NUCLEAR MEDICINE PET SKULL BASE TO THIGH TECHNIQUE: 8.3 mCi F18 Piflufolastat (Pylarify) was injected intravenously. Full-ring PET imaging was performed from the skull base to thigh after the radiotracer. CT data was obtained and used for attenuation correction and anatomic localization. COMPARISON:  None Available.  FINDINGS: NECK No radiotracer activity in neck lymph nodes. Incidental CT finding: No cervical adenopathy. Bilateral carotid atherosclerosis. CHEST No radiotracer accumulation within mediastinal or hilar lymph nodes. No suspicious pulmonary nodules on the CT scan. Incidental CT finding: Aortic and coronary artery calcification. Aortic valve repair. Dual lead pacer. Mild cardiomegaly. Tiny hiatal hernia. Hypoventilation with resultant volume loss and air trapping in the lung bases. ABDOMEN/PELVIS Prostate: No focal activity in the prostate bed. Lymph nodes: No abnormal radiotracer accumulation within pelvic or abdominal nodes. Liver: No evidence of liver metastasis Incidental CT finding: Hepatic cysts at up to 3.2 cm. 4 mm gallstone. Normal adrenal glands. Mild renal cortical thinning bilaterally. Normal noncontrast appearance of the pancreas, urinary bladder. Prostatectomy. Extensive colonic diverticulosis. SKELETON No focal  activity to suggest skeletal metastasis. Degenerative partial fusion of bilateral sacroiliac joints. No worrisome sclerotic lesion. IMPRESSION: 1. No tracer avid recurrent or metastatic disease, status post prostatectomy. 2. Incidental findings, including: Coronary artery atherosclerosis. Aortic Atherosclerosis (ICD10-I70.0). Cholelithiasis. Electronically Signed   By: Abigail Miyamoto M.D.   On: 02/19/2022 15:51      IMPRESSION/PLAN: 1. 76 y.o. 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  Using the axial CT images from his recent PET scan, I outlined for the patient and his wife how I identify and contour of the prostatic fossa.  I talked to  them about the long disease-free interval from prostatectomy to now and how that seemed to be very consistent with Gleason 6 cancer and also potentially more suggestive of local recurrence, although there is uncertainty given the PET scan findings.  We talked about the rationale to treat the prostatic fossa empirically with radiation therapy with salvage intent.  We also talked a little bit about treating pelvic lymph nodes.  I reviewed with them the logistics and delivery of pelvic radiotherapy as well  as the anticipated acute and late sequelae.  We also talked about continued surveillance reserving ADT for future salvage.  I explained that I think of the current window of opportunity representing curative salvage.    He appears to have a good understanding of his disease and our treatment recommendations which are of curative intent.  He was encouraged to ask questions that were answered to his stated satisfaction.  At the conclusion of our conversation, the patient is interested in moving forward with second opinion at [facility] next week.  Patient did express some interest in having a repeat PSA at a separate laboratory facility to confirm the current findings.  I personally spent 60 minutes in this encounter including chart review, reviewing radiological studies, meeting face-to-face with the patient, entering orders and completing documentation.      Tyler Pita, MD  [facility]  Radiation Oncology Direct Dial: [phone]  Fax: [phone] [email or web address]  Skype  LinkedIn

## 2022-03-01 ENCOUNTER — Ambulatory Visit
Admission: RE | Admit: 2022-03-01 | Discharge: 2022-03-01 | Disposition: A | Discharge status: Home / Self Care | Payer: Medicare Other | Source: Ambulatory Visit | Attending: Radiation Oncology | Admitting: Radiation Oncology

## 2022-03-01 ENCOUNTER — Other Ambulatory Visit: Payer: Self-pay

## 2022-03-01 VITALS — BP 104/79 | HR 92 | Temp 97.8°F | Resp 20 | Ht 70.0 in | Wt 231.0 lb

## 2022-03-01 DIAGNOSIS — E7800 Pure hypercholesterolemia, unspecified: Secondary | ICD-10-CM | POA: Insufficient documentation

## 2022-03-01 DIAGNOSIS — C61 Malignant neoplasm of prostate: Secondary | ICD-10-CM | POA: Insufficient documentation

## 2022-03-01 DIAGNOSIS — F32A Depression, unspecified: Secondary | ICD-10-CM | POA: Insufficient documentation

## 2022-03-01 DIAGNOSIS — F1021 Alcohol dependence, in remission: Secondary | ICD-10-CM | POA: Insufficient documentation

## 2022-03-01 DIAGNOSIS — I6523 Occlusion and stenosis of bilateral carotid arteries: Secondary | ICD-10-CM | POA: Diagnosis not present

## 2022-03-01 DIAGNOSIS — Z79899 Other long term (current) drug therapy: Secondary | ICD-10-CM | POA: Insufficient documentation

## 2022-03-01 DIAGNOSIS — F1010 Alcohol abuse, uncomplicated: Secondary | ICD-10-CM | POA: Insufficient documentation

## 2022-03-01 DIAGNOSIS — K5730 Diverticulosis of large intestine without perforation or abscess without bleeding: Secondary | ICD-10-CM | POA: Insufficient documentation

## 2022-03-01 DIAGNOSIS — K8020 Calculus of gallbladder without cholecystitis without obstruction: Secondary | ICD-10-CM | POA: Insufficient documentation

## 2022-03-01 DIAGNOSIS — F419 Anxiety disorder, unspecified: Secondary | ICD-10-CM | POA: Insufficient documentation

## 2022-03-01 DIAGNOSIS — I38 Endocarditis, valve unspecified: Secondary | ICD-10-CM | POA: Insufficient documentation

## 2022-03-01 DIAGNOSIS — I10 Essential (primary) hypertension: Secondary | ICD-10-CM | POA: Insufficient documentation

## 2022-03-01 DIAGNOSIS — M797 Fibromyalgia: Secondary | ICD-10-CM | POA: Insufficient documentation

## 2022-03-01 DIAGNOSIS — I6529 Occlusion and stenosis of unspecified carotid artery: Secondary | ICD-10-CM | POA: Insufficient documentation

## 2022-03-01 DIAGNOSIS — K449 Diaphragmatic hernia without obstruction or gangrene: Secondary | ICD-10-CM | POA: Insufficient documentation

## 2022-03-01 DIAGNOSIS — Z860100 Personal history of colon polyps, unspecified: Secondary | ICD-10-CM | POA: Insufficient documentation

## 2022-03-01 DIAGNOSIS — Z8546 Personal history of malignant neoplasm of prostate: Secondary | ICD-10-CM | POA: Insufficient documentation

## 2022-03-01 DIAGNOSIS — F1011 Alcohol abuse, in remission: Secondary | ICD-10-CM | POA: Insufficient documentation

## 2022-03-01 DIAGNOSIS — R9721 Rising PSA following treatment for malignant neoplasm of prostate: Secondary | ICD-10-CM | POA: Diagnosis not present

## 2022-03-01 DIAGNOSIS — E782 Mixed hyperlipidemia: Secondary | ICD-10-CM | POA: Insufficient documentation

## 2022-03-01 DIAGNOSIS — Z8674 Personal history of sudden cardiac arrest: Secondary | ICD-10-CM | POA: Insufficient documentation

## 2022-03-01 DIAGNOSIS — E669 Obesity, unspecified: Secondary | ICD-10-CM | POA: Insufficient documentation

## 2022-03-01 NOTE — Progress Notes (Signed)
Introduced myself to the patient, and his wife, as the prostate nurse navigator.  No barriers to care identified at this time.  He is here to discuss his salvage radiation treatment options.  Patient had prostatectomy about 17 years ago.  I gave him my business card, and provided hand out with available resources. Encouraged him to call me with questions or concerns.  Verbalized understanding.

## 2022-03-05 ENCOUNTER — Ambulatory Visit: Payer: Medicare Other | Admitting: Nurse Practitioner

## 2022-03-09 DIAGNOSIS — C61 Malignant neoplasm of prostate: Secondary | ICD-10-CM | POA: Diagnosis not present

## 2022-03-15 NOTE — Progress Notes (Signed)
Patient was a radiation oncology consult on 5/22 for detectable rising PSA of 0.7 status post prostatectomy 17 years ago for stage T2 adenocarcinoma of the prostate with Gleason score of 3+3.   Patient recently had consult @ [facility].  RN followed up to inquire if a treatment decision has been made or if any additional questions/concerns.   Patient verbalized he would like to proceed with radiation treatment.  He has a appointment with Dr. Henrene Pastor due to recent anemia that he would like to complete prior to any finalization in scheduling radiation.  Pt will contact me directly after consult with Dr. Henrene Pastor on 6/20 to move forward with finalizing decision.

## 2022-03-16 ENCOUNTER — Ambulatory Visit: Payer: Medicare Other | Admitting: Internal Medicine

## 2022-03-30 ENCOUNTER — Ambulatory Visit (INDEPENDENT_AMBULATORY_CARE_PROVIDER_SITE_OTHER): Payer: Medicare Other | Admitting: Nurse Practitioner

## 2022-03-30 ENCOUNTER — Other Ambulatory Visit (INDEPENDENT_AMBULATORY_CARE_PROVIDER_SITE_OTHER): Payer: Medicare Other

## 2022-03-30 ENCOUNTER — Encounter: Payer: Self-pay | Admitting: Nurse Practitioner

## 2022-03-30 VITALS — BP 100/62 | HR 83 | Ht 70.0 in | Wt 239.0 lb

## 2022-03-30 DIAGNOSIS — D509 Iron deficiency anemia, unspecified: Secondary | ICD-10-CM

## 2022-03-30 LAB — CBC
HCT: 38.1 % — ABNORMAL LOW (ref 39.0–52.0)
Hemoglobin: 12.7 g/dL — ABNORMAL LOW (ref 13.0–17.0)
MCHC: 33.2 g/dL (ref 30.0–36.0)
MCV: 86.6 fl (ref 78.0–100.0)
Platelets: 206 10*3/uL (ref 150.0–400.0)
RBC: 4.41 Mil/uL (ref 4.22–5.81)
RDW: 14.8 % (ref 11.5–15.5)
WBC: 6.2 10*3/uL (ref 4.0–10.5)

## 2022-03-30 LAB — IBC + FERRITIN
Ferritin: 90.7 ng/mL (ref 22.0–322.0)
Iron: 47 ug/dL (ref 42–165)
Saturation Ratios: 14.2 % — ABNORMAL LOW (ref 20.0–50.0)
TIBC: 331.8 ug/dL (ref 250.0–450.0)
Transferrin: 237 mg/dL (ref 212.0–360.0)

## 2022-03-30 MED ORDER — CLENPIQ 10-3.5-12 MG-GM -GM/160ML PO SOLN: 320.0000 mL | Freq: Once | ORAL | 0 refills | Status: AC

## 2022-03-30 NOTE — Progress Notes (Signed)
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   He presents to our office today as recommended by Dr. Sheryn Bison for further evaluation regarding anemia. He underwent laboratory studies by Dr.Thacker on 02/02/2022 which showed mild iron deficiency anemia.  Hemoglobin level of 12.9.  Hematocrit 37.8.  MCV 84.4.  Platelet 193.  Iron 42.  Iron saturation 13%.  TIBC 324.  BUN 13.  Creatinine 0.99.  He started taking ferrous sulfate 325 mg 1 tab twice daily 2 to 3 weeks ago.  He denies having any dysphagia.  He has heartburn once every 2 weeks.  No dysphagia.  He takes Omeprazole 20 mg OTC as needed.  He is passing a normal formed brown bowel movement daily.  Not on aspirin.  He takes Advil 2 tabs once every 10 to 14 days for aches and pains. He reported undergoing 2 colonoscopies in his lifetime.  His first colonoscopy was at least 20 years ago, he reported 2 small colon polyps were removed.  He underwent a second colonoscopy 10 to 12 years ago which he reported was normal.  He underwent a flexible sigmoidoscopy in 1990 which showed with colitis which resolved after he used a steroid enema for a brief period of time.  He underwent a Cologuard test 06/20/2019 which was negative.  No family history of celiac disease, IBD or colorectal cancer.  He was recently seen by his urologist who follows his PSA levels. PSA was  0.15 on 11/19/19 -> 0.44 on 09/02/21 and 0.7 on 02/03/22. PET on 02/17/22 without recurrent disease in the prostatic fossa, no evidence of metastatic disease. He was seen by oncologist Dr. Talmage Nap at [facility] regarding his rising PSA level who recommended prostate bed radiotherapy . He saw radiation oncologist Dr. Tammi Klippel 03/01/2022 who also discussed treating the prostatic fossa empirically with radiation therapy with salvage intent.  He wishes to undergo a colonoscopy as soon as possible so he can start his radiation therapy.     Latest Ref Rng & Units 11/01/2014    9:10 PM 11/01/2014    8:23 PM 09/15/2014    8:45 PM  CBC  WBC 4.0 - 10.5 K/uL  6.5  4.2   Hemoglobin 13.0 - 17.0 g/dL 15.3  13.2  13.2   Hematocrit 39.0 - 52.0 % 45.0  39.3  39.4   Platelets 150 - 400 K/uL  265  139     PET scan 02/17/2022: NUCLEAR MEDICINE PET SKULL BASE TO THIGH   NECK No radiotracer activity in neck lymph nodes.   Incidental CT finding: No cervical adenopathy. Bilateral carotid atherosclerosis.   CHEST   No radiotracer accumulation within mediastinal or hilar lymph nodes. No suspicious pulmonary nodules on the CT scan.   Incidental CT finding: Aortic and coronary artery calcification. Aortic valve repair. Dual lead pacer. Mild cardiomegaly. Tiny hiatal hernia. Hypoventilation with resultant volume loss and air trapping in the lung bases.   ABDOMEN/PELVIS   Prostate: No focal  activity in the prostate bed.   Lymph nodes: No abnormal radiotracer accumulation within pelvic or abdominal nodes.   Liver: No evidence of liver metastasis   Incidental CT finding: Hepatic cysts at up to 3.2 cm. 4 mm gallstone. Normal adrenal glands. Mild renal cortical thinning bilaterally. Normal noncontrast appearance of the pancreas, urinary bladder. Prostatectomy. Extensive colonic diverticulosis.   SKELETON   No focal  activity to suggest skeletal metastasis.   Degenerative partial fusion of bilateral sacroiliac  joints. No worrisome sclerotic lesion.   IMPRESSION: 1. No tracer avid recurrent or metastatic disease, status post prostatectomy. 2. Incidental findings, including: Coronary artery atherosclerosis. Aortic Atherosclerosis (ICD10-I70.0). Cholelithiasis.   Echocardiogram 11/16/2017: Left ventricle cavity is normal in size. Mild to moderate concentric hypertrophy of the left ventricle. Normal global wall motion. Doppler evidence of grade I (impaired) diastolic dysfunction, normal LAP. Calculated EF 55%. Left atrial cavity is mildly dilated. Well seated, normally functioning bioprosthetic aortic valve. No stenosis or regurgitation noted. Tricuspid valve with mild regurgitation. Estimated pulmonary artery systolic pressure 22 mmHg. Compared to prior echocardiogram dated 06/09/2017, aortic valve replacement is new.  Past Medical History:  Diagnosis Date   Alcoholism ([place])    Anxiety    Aortic stenosis 08/14/2017   Cardiac syncope 04/28/2019   Depression    Encounter for care of pacemaker 04/28/2019   History of pacemaker    Hypertension    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 04/11/2013   Sinus node dysfunction ([place]) 04/28/2019   Past Surgical History:  Procedure Laterality Date   APPENDECTOMY     INTRAVASCULAR PRESSURE WIRE/FFR STUDY N/A 08/30/2017   Procedure: INTRAVASCULAR PRESSURE WIRE/FFR STUDY;  Surgeon: Nigel Mormon, MD;  Location: [place] [facility];  Service: Cardiovascular;  Laterality: N/A;   PACEMAKER INSERTION  04/13/2014   PROSTATECTOMY     RIGHT/LEFT HEART CATH AND CORONARY ANGIOGRAPHY N/A 08/30/2017   Procedure: RIGHT/LEFT HEART CATH AND CORONARY ANGIOGRAPHY;  Surgeon: Nigel Mormon, MD;  Location: [place] [facility];  Service: Cardiovascular;  Laterality: N/A;   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   Scalp   TEE WITHOUT CARDIOVERSION N/A 08/18/2017   Procedure: TRANSESOPHAGEAL ECHOCARDIOGRAM (TEE);  Surgeon: Adrian Prows, MD;  Location: [place];  Service: Cardiovascular;  Laterality: N/A;   Social History: He is married.  He is a retired Company secretary.  Non-smoker.  Abstinent from alcohol since 2016.  No drug use.  Family History: No known family history of esophageal, gastric or colon cancer.  Mother, father and sister with heart disease.  No Known Allergies   Outpatient Encounter Medications as of 03/30/2022  Medication Sig   Ascorbic Acid (VITAMIN C) 1000 MG tablet Take 3,000 mg by mouth 2 (two) times daily.    atorvastatin (LIPITOR) 40 MG tablet Take 40 mg by mouth at bedtime.    buPROPion (WELLBUTRIN XL) 150 MG 24 hr tablet 1 tablet in the morning   chlorpheniramine-HYDROcodone 10-8 MG/5ML 5-10 mL as needed (Patient not taking: Reported on 03/01/2022)   DULoxetine (CYMBALTA) 60 MG capsule Take 2 capsules (120 mg total) by mouth daily.   gabapentin (NEURONTIN) 600 MG tablet Take 600 mg by mouth 3 (three) times daily.   Lysine 1000 MG TABS Take 3,000 mg by mouth 2 (two) times daily.   metaxalone (SKELAXIN) 800 MG tablet TAKE ONE TABLET THREE TIMES DAILY AS NEEDED   pregabalin (LYRICA) 150 MG capsule TAKE ONE CAPSULE TWICE A DAY   traZODone (DESYREL) 50  MG tablet Take 50 mg by mouth at bedtime as needed.   valsartan-hydrochlorothiazide (DIOVAN-HCT) 80-12.5 MG tablet TAKE ONE TABLET EVERY MORNING   No facility-administered encounter medications on file as of 03/30/2022.    REVIEW OF SYSTEMS:  Gen: Denies fever, sweats or chills. No weight loss.  CV: Denies chest pain, palpitations or edema. Resp: Denies cough, shortness of breath of hemoptysis.  GI: See HPI GU : Denies urinary burning, blood in urine, increased urinary frequency or incontinence. MS: + Fibromyalgia pain.  Derm: Denies rash, itchiness, skin lesions or unhealing ulcers. Psych: Denies depression, anxiety or memory loss Heme: Denies bruising, easy bleeding. Neuro:  Denies headaches, dizziness or paresthesias. Endo:   Denies any problems with DM, thyroid or adrenal function.  PHYSICAL EXAM: BP 100/62   Pulse 83   Ht '5\' 10"'$  (1.778 m)   Wt 239 lb (108.4 kg)   BMI 34.29 kg/m   General: 76 year old male in no acute distress. Head: Normocephalic and atraumatic. Eyes:  Sclerae non-icteric, conjunctive pink. Ears: Normal auditory acuity. Mouth: Dentition intact. No ulcers or lesions.  Neck: Supple, no lymphadenopathy or thyromegaly.  Lungs: Clear bilaterally to auscultation without wheezes, crackles or rhonchi. Heart: Regular rate and rhythm. > Very faint systolic murmur. No rub or gallop appreciated.  Abdomen: Soft, nontender, non distended. No masses. No hepatosplenomegaly. Normoactive bowel sounds x 4 quadrants.  Rectal: Deferred. Musculoskeletal: Symmetrical with no gross deformities. Skin: Warm and dry. No rash or lesions on visible extremities. Extremities: No edema. Neurological: Alert oriented x 4, no focal deficits.  Psychological:  Alert and cooperative. Normal mood and affect.  ASSESSMENT AND PLAN:  45) 76 year old male with mild iron deficiency anemia. No overt GI bleeding.  On Ferrous Sulfate 325 mg twice daily x 2 to 3 weeks.  -CBC, IBC + ferritin, TTG and IGA -EGD/colonoscopy to rule out etiology for IDA, benefits and risks discussed including risk with sedation, risk of bleeding, perforation and infection. Patient wishes to schedule EGD and colonoscopy as soon as possible so he can start radiation therapy.   2) Prostate cancer s/p prostatectomy with rising PSA level of 0.7, concerning for local recurrence. PET on 02/17/22 revealed no recurrent disease in the prostatic fossa. Planning on start radiation therapy per radiation oncologist.   3) Remote history of colon polyps. Most recent colonoscopy 10 -12 years ago was reported as normal per the patient. Cologuard test 06/2019 was negative  4) Infrequent GERD symptoms  -Omeprazole 20 mg 1 p.o. as needed  5) History of CAD (LAD 50%  stenosis)  6) Cardiac arrest x 3 2014 - 2015, diagnosed with sick sinus syndrome subsequently underwent pacemaker placement in 2015 without recurrent cardiac arrest or syncope     CC:  Aura Dials, MD

## 2022-03-30 NOTE — Patient Instructions (Addendum)
If you are age 76 or older, your body mass index should be between 23-30. Your Body mass index is 34.29 kg/m. If this is out of the aforementioned range listed, please consider follow up with your Primary Care Provider.  If you are age 25 or younger, your body mass index should be between 19-25. Your Body mass index is 34.29 kg/m. If this is out of the aformentioned range listed, please consider follow up with your Primary Care Provider.   Your provider has requested that you go to the basement level for lab work before leaving today. Press "B" on the elevator. The lab is located at the first door on the left as you exit the elevator.   The [place] [facility] providers would like to encourage you to use [facility] to communicate with providers for non-urgent requests or questions.  Due to long hold times on the telephone, sending your provider a message by [facility] may be a faster and more efficient way to get a response.  Please allow 48 business hours for a response.  Please remember that this is for non-urgent requests.     It was a pleasure to see you today!  Thank you for trusting me with your gastrointestinal care!

## 2022-03-31 ENCOUNTER — Telehealth: Payer: Self-pay | Admitting: Nurse Practitioner

## 2022-03-31 ENCOUNTER — Other Ambulatory Visit: Payer: Self-pay

## 2022-03-31 DIAGNOSIS — D509 Iron deficiency anemia, unspecified: Secondary | ICD-10-CM

## 2022-03-31 NOTE — Telephone Encounter (Signed)
Inbound call from patient stating he received a message and would like for a nurse to give him a call back . Please give patient a call back to advise.

## 2022-03-31 NOTE — Progress Notes (Signed)
Agree with the assessment and plan as outlined by Carl Best, NP. Agree with plan for expedited EGD/colonoscopy for evaluation of IDA and to help facilitate expediency of upcoming plan to start radiation therapy to the prostate fossa.  Hold iron 7 days prior to procedures.     Gerrit Heck, DO, [facility]

## 2022-04-01 LAB — TISSUE TRANSGLUTAMINASE ABS,IGG,IGA
(tTG) Ab, IgA: <1 U/mL
(tTG) Ab, IgG: <1 U/mL

## 2022-04-01 NOTE — Progress Notes (Signed)
Manuel Escobar, refer to lab notes 03/31/2022. Thank you for trying to reach him yesterday regarding iron hold instructions. I called patient at this time and I left a detailed msg on his personal voice mail regarding holding oral iron (ferrous sulfate) starting now, stay off oral iron until colonoscopy completed. Colonoscopy is scheduled 04/07/2022. Following his colonoscopy, he can restart iron one po QD.

## 2022-04-01 NOTE — Telephone Encounter (Signed)
Spoke with pt. Documented under results notes:  

## 2022-04-06 DIAGNOSIS — G4700 Insomnia, unspecified: Secondary | ICD-10-CM | POA: Diagnosis not present

## 2022-04-06 DIAGNOSIS — I10 Essential (primary) hypertension: Secondary | ICD-10-CM | POA: Diagnosis not present

## 2022-04-06 DIAGNOSIS — E782 Mixed hyperlipidemia: Secondary | ICD-10-CM | POA: Diagnosis not present

## 2022-04-06 DIAGNOSIS — G8929 Other chronic pain: Secondary | ICD-10-CM | POA: Diagnosis not present

## 2022-04-07 ENCOUNTER — Encounter: Payer: Self-pay | Admitting: Gastroenterology

## 2022-04-07 ENCOUNTER — Ambulatory Visit (AMBULATORY_SURGERY_CENTER): Payer: Medicare Other | Admitting: Gastroenterology

## 2022-04-07 VITALS — BP 101/49 | HR 66 | Temp 98.2°F | Resp 11 | Ht 70.0 in | Wt 239.0 lb

## 2022-04-07 DIAGNOSIS — D509 Iron deficiency anemia, unspecified: Secondary | ICD-10-CM

## 2022-04-07 DIAGNOSIS — D122 Benign neoplasm of ascending colon: Secondary | ICD-10-CM | POA: Diagnosis not present

## 2022-04-07 DIAGNOSIS — K317 Polyp of stomach and duodenum: Secondary | ICD-10-CM | POA: Diagnosis not present

## 2022-04-07 DIAGNOSIS — K2100 Gastro-esophageal reflux disease with esophagitis, without bleeding: Secondary | ICD-10-CM | POA: Diagnosis not present

## 2022-04-07 DIAGNOSIS — K6389 Other specified diseases of intestine: Secondary | ICD-10-CM | POA: Diagnosis not present

## 2022-04-07 DIAGNOSIS — K640 First degree hemorrhoids: Secondary | ICD-10-CM

## 2022-04-07 DIAGNOSIS — D125 Benign neoplasm of sigmoid colon: Secondary | ICD-10-CM

## 2022-04-07 DIAGNOSIS — K5730 Diverticulosis of large intestine without perforation or abscess without bleeding: Secondary | ICD-10-CM | POA: Diagnosis not present

## 2022-04-07 DIAGNOSIS — K2970 Gastritis, unspecified, without bleeding: Secondary | ICD-10-CM | POA: Diagnosis not present

## 2022-04-07 DIAGNOSIS — K319 Disease of stomach and duodenum, unspecified: Secondary | ICD-10-CM | POA: Diagnosis not present

## 2022-04-07 MED ORDER — PANTOPRAZOLE SODIUM 40 MG PO TBEC: 40.0000 mg | DELAYED_RELEASE_TABLET | Freq: Two times a day (BID) | ORAL | 3 refills | Status: DC

## 2022-04-07 MED ORDER — SODIUM CHLORIDE 0.9 % IV SOLN: 500.0000 mL | Freq: Once | INTRAVENOUS | Status: DC

## 2022-04-07 NOTE — Op Note (Signed)
[place] Patient Name: Manuel Escobar Procedure Date: 04/07/2022 2:50 PM MRN: [id number] Endoscopist: Gerrit Heck , MD Age: 76 Referring MD:  Date of Birth: Feb 22, 1946 Gender: Male Account #: [id number] Procedure:                Colonoscopy Indications:              Iron deficiency anemia Medicines:                Monitored Anesthesia Care Procedure:                Pre-Anesthesia Assessment:                           - Prior to the procedure, a History and Physical                            was performed, and patient medications and                            allergies were reviewed. The patient's tolerance of                            previous anesthesia was also reviewed. The risks                            and benefits of the procedure and the sedation                            options and risks were discussed with the patient.                            All questions were answered, and informed consent                            was obtained. Prior Anticoagulants: The patient has                            taken no previous anticoagulant or antiplatelet                            agents. ASA Grade Assessment: III - A patient with                            severe systemic disease. After reviewing the risks                            and benefits, the patient was deemed in                            satisfactory condition to undergo the procedure.                           After obtaining informed consent, the colonoscope  was passed under direct vision. Throughout the                            procedure, the patient's blood pressure, pulse, and                            oxygen saturations were monitored continuously. The                            CF [id number] #[id number] was introduced through the anus                            and advanced to the the terminal ileum. The                            colonoscopy was performed without  difficulty. The                            patient tolerated the procedure well. The quality                            of the bowel preparation was good. The terminal                            ileum, ileocecal valve, appendiceal orifice, and                            rectum were photographed. Scope In: 3:33:14 PM Scope Out: 4:01:01 PM Scope Withdrawal Time: 0 hours 21 minutes 54 seconds  Total Procedure Duration: 0 hours 27 minutes 47 seconds  Findings:                 Skin tags were found on perianal exam.                           A 5 mm polyp was found in the ascending colon. The                            polyp was sessile. The polyp was removed with a                            cold snare. Resection and retrieval were complete.                            Estimated blood loss was minimal.                           A 8 mm polyp was found in the sigmoid colon. The                            polyp was sessile. The polyp was removed with a  cold snare. Resection and retrieval were complete.                            Estimated blood loss was minimal.                           Multiple small and large-mouthed diverticula were                            found in the sigmoid colon.                           Non-bleeding internal hemorrhoids were found during                            retroflexion. The hemorrhoids were small. There was                            a single colum extending into the rectum that is                            likely a small hemorrhoidal column vs single column                            subtle grade 1 varix.                           A diffuse area of melanosis was found in the entire                            colon.                           The terminal ileum appeared normal. Complications:            No immediate complications. Estimated Blood Loss:     Estimated blood loss was minimal. Impression:               - Perianal skin  tags found on perianal exam.                           - One 5 mm polyp in the ascending colon, removed                            with a cold snare. Resected and retrieved.                           - One 8 mm polyp in the sigmoid colon, removed with                            a cold snare. Resected and retrieved.                           - Diverticulosis in the sigmoid colon.                           -  Non-bleeding internal hemorrhoids.                           - Melanosis in the colon.                           - The examined portion of the ileum was normal. Recommendation:           - Patient has a contact number available for                            emergencies. The signs and symptoms of potential                            delayed complications were discussed with the                            patient. Return to normal activities tomorrow.                            Written discharge instructions were provided to the                            patient.                           - Resume previous diet.                           - Continue present medications.                           - Await pathology results.                           - Repeat colonoscopy for surveillance based on                            pathology results.                           - Return to GI office PRN. Gerrit Heck, MD 04/07/2022 4:23:15 PM

## 2022-04-07 NOTE — Op Note (Signed)
Manuel Escobar Procedure Date: 04/07/2022 2:55 PM MRN: [id number] Endoscopist: Gerrit Heck , MD Age: 76 Referring MD:  Date of Birth: 1946/01/15 Gender: Male Account #: [id number] Procedure:                Upper GI endoscopy Indications:              Iron deficiency anemia, Heartburn Medicines:                Monitored Anesthesia Care Procedure:                Pre-Anesthesia Assessment:                           - Prior to the procedure, a History and Physical                            was performed, and patient medications and                            allergies were reviewed. The patient's tolerance of                            previous anesthesia was also reviewed. The risks                            and benefits of the procedure and the sedation                            options and risks were discussed with the patient.                            All questions were answered, and informed consent                            was obtained. Prior Anticoagulants: The patient has                            taken no previous anticoagulant or antiplatelet                            agents. ASA Grade Assessment: III - A patient with                            severe systemic disease. After reviewing the risks                            and benefits, the patient was deemed in                            satisfactory condition to undergo the procedure.                           After obtaining informed consent, the endoscope was  passed under direct vision. Throughout the                            procedure, the patient's blood pressure, pulse, and                            oxygen saturations were monitored continuously. The                            GIF [id number] #[id number] was introduced through the                            mouth, and advanced to the third part of duodenum.                            The upper GI endoscopy was  accomplished without                            difficulty. The patient tolerated the procedure                            well. Scope In: Scope Out: Findings:                 LA Grade A (one or more mucosal breaks less than 5                            mm, not extending between tops of 2 mucosal folds)                            esophagitis with no bleeding was found in the lower                            third of the esophagus.                           Moderate inflammation characterized by congestion                            (edema), erosions and erythema was found in the                            gastric fundus, in the gastric body and in the                            gastric antrum. No active bleeding or high grade                            stigmata of bleeding that required endoscopic                            intevention. Biopsies were taken with a cold  forceps for Helicobacter pylori testing. Estimated                            blood loss was minimal.                           A few 2 to 3 mm sessile polyps with no bleeding and                            no stigmata of recent bleeding were found in the                            gastric body. These polyps were removed with a cold                            biopsy forceps. Resection and retrieval were                            complete. Estimated blood loss was minimal.                           The examined duodenum was normal. Biopsies were                            taken with a cold forceps for histology. Estimated                            blood loss was minimal. Complications:            No immediate complications. Estimated Blood Loss:     Estimated blood loss was minimal. 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                           - Gastritis. Biopsied.                           - A few gastric polyps. Resected and retrieved.                            - Normal examined duodenum. Biopsied. Recommendation:           - Patient has a contact number available for                            emergencies. The signs and symptoms of potential                            delayed complications were discussed with the                            patient. Return to normal activities tomorrow.  Written discharge instructions were provided to the                            patient.                           - Resume previous diet.                           - Continue present medications.                           - Await pathology results.                           - Use Protonix (pantoprazole) 40 mg PO BID for 6                            weeks to promote mucosal healing of gastritis and                            erosive esophagitis. After 6 weeks, reduce to 40 mg                            daily and reduce to lowest effective dose to                            control reflux symptoms.                           - Perform a colonoscopy today. Gerrit Heck, MD 04/07/2022 4:14:59 PM

## 2022-04-07 NOTE — Progress Notes (Signed)
Sedate, gd SR, tolerated procedure well, VSS, report to RN 

## 2022-04-07 NOTE — Patient Instructions (Signed)
Thank you for coming in to see [place] to today! Biopsy results will be available in 2 weeks.  We will notify you by mail/ MyChart . We will call if there is something that needs treatment. Resume your diet and medications today, Begin Pantoprazole 40 mg twice per day before the first and last meal of the day for 6 weeks.  Then reduce to once daily to manage symptoms. Return to normal daily activities tomorrow.   YOU HAD AN ENDOSCOPIC PROCEDURE TODAY AT [facility]:   Refer to the procedure report that was given to you for any specific questions about what was found during the examination.  If the procedure report does not answer your questions, please call your gastroenterologist to clarify.  If you requested that your care partner not be given the details of your procedure findings, then the procedure report has been included in a sealed envelope for you to review at your convenience later.  YOU SHOULD EXPECT: Some feelings of bloating in the abdomen. Passage of more gas than usual.  Walking can help get rid of the air that was put into your GI tract during the procedure and reduce the bloating. If you had a lower endoscopy (such as a colonoscopy or flexible sigmoidoscopy) you may notice spotting of blood in your stool or on the toilet paper. If you underwent a bowel prep for your procedure, you may not have a normal bowel movement for a few days.  Please Note:  You might notice some irritation and congestion in your nose or some drainage.  This is from the oxygen used during your procedure.  There is no need for concern and it should clear up in a day or so.  SYMPTOMS TO REPORT IMMEDIATELY:  Following lower endoscopy (colonoscopy or flexible sigmoidoscopy):  Excessive amounts of blood in the stool  Significant tenderness or worsening of abdominal pains  Swelling of the abdomen that is new, acute  Fever of 100F or higher  Following upper endoscopy (EGD)  Vomiting of blood or  coffee ground material  New chest pain or pain under the shoulder blades  Painful or persistently difficult swallowing  New shortness of breath  Fever of 100F or higher  Black, tarry-looking stools  For urgent or emergent issues, a gastroenterologist can be reached at any hour by calling [phone]. Do not use MyChart messaging for urgent concerns.    DIET:  We do recommend a small meal at first, but then you may proceed to your regular diet.  Drink plenty of fluids but you should avoid alcoholic beverages for 24 hours.  ACTIVITY:  You should plan to take it easy for the rest of today and you should NOT DRIVE or use heavy machinery until tomorrow (because of the sedation medicines used during the test).    FOLLOW UP: Our staff will call the number listed on your records the next business day following your procedure.  We will call around 7:15- 8:00 am to check on you and address any questions or concerns that you may have regarding the information given to you following your procedure. If we do not reach you, we will leave a message.  If you develop any symptoms (ie: fever, flu-like symptoms, shortness of breath, cough etc.) before then, please call [phone].  If you test positive for Covid 19 in the 2 weeks post procedure, please call and report this information to [place].    If any biopsies were taken you will be  contacted by phone or by letter within the next 1-3 weeks.  Please call us at [phone] if you have not heard about the biopsies in 3 weeks.    SIGNATURES/CONFIDENTIALITY: You and/or your care partner have signed paperwork which will be entered into your electronic medical record.  These signatures attest to the fact that that the information above on your After Visit Summary has been reviewed and is understood.  Full responsibility of the confidentiality of this discharge information lies with you and/or your care-partner.

## 2022-04-07 NOTE — Progress Notes (Signed)
Called to room to assist during endoscopic procedure.  Patient ID and intended procedure confirmed with present staff. Received instructions for my participation in the procedure from the performing physician.  

## 2022-04-07 NOTE — Progress Notes (Signed)
Pt's states no medical or surgical changes since previsit or office visit. 

## 2022-04-07 NOTE — Progress Notes (Signed)
GASTROENTEROLOGY PROCEDURE H&P NOTE   Primary Care Physician: Aura Dials, MD    Reason for Procedure:   Iron deficiency anemia  Plan:    EGD, colonoscopy  Patient is appropriate for endoscopic procedure(s) in the ambulatory ([place]) setting.  The nature of the procedure, as well as the risks, benefits, and alternatives were carefully and thoroughly reviewed with the patient. Ample time for discussion and questions allowed. The patient understood, was satisfied, and agreed to proceed.     HPI: Manuel Escobar is a 76 y.o. male who presents for EGD and colonoscopy for evaluation of IDA prior to anticipated upcoming radiation to the prostate fossa.  History of prostatectomy.  Does have a history of polyps in the distant past.  Additionally, history of heartburn and takes Prilosec on demand.  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[place])    Sinus node dysfunction ([place]) 04/28/2019    Past Surgical History:  Procedure Laterality Date   APPENDECTOMY     INTRAVASCULAR PRESSURE WIRE/FFR STUDY N/A 08/30/2017   Procedure: INTRAVASCULAR PRESSURE WIRE/FFR STUDY;  Surgeon: Nigel Mormon, MD;  Location: [place];  Service: Cardiovascular;  Laterality: N/A;   PACEMAKER INSERTION  04/13/2014   PROSTATECTOMY     RIGHT/LEFT HEART CATH AND CORONARY ANGIOGRAPHY N/A 08/30/2017   Procedure: RIGHT/LEFT HEART CATH AND CORONARY ANGIOGRAPHY;  Surgeon: Nigel Mormon, MD;  Location: [place] [facility];  Service: Cardiovascular;  Laterality: N/A;   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   Scalp   TEE WITHOUT CARDIOVERSION N/A 08/18/2017   Procedure: TRANSESOPHAGEAL  ECHOCARDIOGRAM (TEE);  Surgeon: Adrian Prows, MD;  Location: [place];  Service: Cardiovascular;  Laterality: N/A;    Prior to Admission medications   Medication Sig Start Date End Date Taking? Authorizing Provider  Ascorbic Acid (VITAMIN C) 1000 MG tablet Take 3,000 mg by mouth 2 (two) times daily.     [provider]  atorvastatin (LIPITOR) 40 MG tablet Take 40 mg by mouth at bedtime.  02/06/16   [provider]  buPROPion (WELLBUTRIN XL) 150 MG 24 hr tablet 1 tablet in the morning 02/22/22   [provider]  DULoxetine (CYMBALTA) 60 MG capsule Take 2 capsules (120 mg total) by mouth daily. 09/17/14   Withrow, Elyse Jarvis, FNP  gabapentin (NEURONTIN) 600 MG tablet Take 600 mg by mouth 3 (three) times daily. 02/10/22   [provider]  Lysine 1000 MG TABS Take 3,000 mg by mouth 2 (two) times daily.    [provider]  metaxalone (SKELAXIN) 800 MG tablet TAKE ONE TABLET THREE TIMES DAILY AS NEEDED 02/11/21   Adrian Prows, MD  pregabalin (LYRICA) 150 MG capsule TAKE ONE CAPSULE TWICE A DAY 01/26/22   Adrian Prows, MD  traZODone (DESYREL) 50 MG tablet Take 50 mg by mouth at bedtime as needed. 10/30/20   [provider]  valsartan-hydrochlorothiazide (DIOVAN-HCT) 80-12.5 MG tablet TAKE ONE TABLET EVERY MORNING 02/26/22   Adrian Prows, MD    Current Outpatient Medications  Medication Sig Dispense Refill   Ascorbic Acid (VITAMIN C) 1000 MG tablet Take 3,000 mg by mouth 2 (two) times daily.  atorvastatin (LIPITOR) 40 MG tablet Take 40 mg by mouth at bedtime.   5   buPROPion (WELLBUTRIN XL) 150 MG 24 hr tablet 1 tablet in the morning     DULoxetine (CYMBALTA) 60 MG capsule Take 2 capsules (120 mg total) by mouth daily.  3   gabapentin (NEURONTIN) 600 MG tablet Take 600 mg by mouth 3 (three) times daily.     Lysine 1000 MG TABS Take 3,000 mg by mouth 2 (two) times daily.     metaxalone (SKELAXIN) 800 MG tablet TAKE ONE TABLET THREE TIMES DAILY AS NEEDED 90  tablet 3   pregabalin (LYRICA) 150 MG capsule TAKE ONE CAPSULE TWICE A DAY 90 capsule 3   traZODone (DESYREL) 50 MG tablet Take 50 mg by mouth at bedtime as needed.     valsartan-hydrochlorothiazide (DIOVAN-HCT) 80-12.5 MG tablet TAKE ONE TABLET EVERY MORNING 90 tablet 1   Current Facility-Administered Medications  Medication Dose Route Frequency Provider Last Rate Last Admin   0.9 %  sodium chloride infusion  500 mL Intravenous Once Arsema Tusing V, DO        Allergies as of 04/07/2022   (No Known Allergies)    Family History  Problem Relation Age of Onset   Heart attack Mother    Heart attack Father    Heart disease Sister    Coronary artery disease Other    Stroke Other    Colon cancer Neg Hx     Social History   Socioeconomic History   Marital status: Married    Spouse name: Not on file   Number of children: 0   Years of education: Not on file   Highest education level: Not on file  Occupational History   Not on file  Tobacco Use   Smoking status: Never   Smokeless tobacco: Never  Vaping Use   Vaping Use: Never used  Substance and Sexual Activity   Alcohol use: Not Currently    Comment: recovering alcholic 6 yrs sober   Drug use: No   Sexual activity: Not on file  Other Topics Concern   Not on file  Social History Narrative   Not on file   Social Determinants of Health   Financial Resource Strain: Not on file  Food Insecurity: Not on file  Transportation Needs: Not on file  Physical Activity: Not on file  Stress: Not on file  Social Connections: Not on file  Intimate Partner Violence: Not on file    Physical Exam: Vital signs in last 24 hours: '@BP'$  127/76   Pulse 79   Ht '5\' 10"'$  (1.778 m)   Wt 239 lb (108.4 kg)   SpO2 97%   BMI 34.29 kg/m  GEN: NAD EYE: Sclerae anicteric ENT: MMM CV: Non-tachycardic Pulm: CTA b/l GI: Soft, NT/ND NEURO:  Alert & Oriented x [place], DO [facility]   04/07/2022 2:59 PM

## 2022-04-08 ENCOUNTER — Telehealth: Payer: Self-pay | Admitting: *Deleted

## 2022-04-08 DIAGNOSIS — R9721 Rising PSA following treatment for malignant neoplasm of prostate: Secondary | ICD-10-CM | POA: Diagnosis not present

## 2022-04-08 DIAGNOSIS — C61 Malignant neoplasm of prostate: Secondary | ICD-10-CM | POA: Diagnosis not present

## 2022-04-08 NOTE — Telephone Encounter (Signed)
  Follow up Call-     04/07/2022    3:05 PM  Call back number  Post procedure Call Back phone  # [phone]  Permission to leave phone message Yes     Patient questions:  Do you have a fever, pain , or abdominal swelling? No. Pain Score  0 *  Have you tolerated food without any problems? Yes.    Have you been able to return to your normal activities? Yes.    Do you have any questions about your discharge instructions: Diet   No. Medications  No. Follow up visit  No.  Do you have questions or concerns about your Care? No.  Actions: * If pain score is 4 or above: No action needed, pain <4.

## 2022-04-14 NOTE — Progress Notes (Signed)
  Radiation Oncology         [phone]) [phone] ________________________________  Name: Manuel Escobar MRN: [id number]  Date: 04/15/2022  DOB: 11-Aug-1946  SIMULATION AND TREATMENT PLANNING NOTE    ICD-10-CM   1. Prostate cancer ([place])  C61       DIAGNOSIS:  76 y.o. 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  NARRATIVE:  The patient was brought to the [facility].  Identity was confirmed.  All relevant records and images related to the planned course of therapy were reviewed.  The patient freely provided informed written consent to proceed with treatment after reviewing the details related to the planned course of therapy. The consent form was witnessed and verified by the simulation staff.  Then, the patient was set-up in a stable reproducible supine position for radiation therapy.  A vacuum lock pillow device was custom fabricated to position his legs in a reproducible immobilized position.  Then, I performed a urethrogram under sterile conditions to identify the prostatic apex.  CT images were obtained.  Surface markings were placed.  The CT images were loaded into the planning software.  Then the prostate target and avoidance structures including the rectum, bladder, bowel and hips were contoured.  Treatment planning then occurred.  The radiation prescription was entered and confirmed.  A total of one complex treatment devices was fabricated. I have requested : Intensity Modulated Radiotherapy (IMRT) is medically necessary for this case for the following reason:  Rectal sparing.Marland Kitchen  PLAN:  The prostate fossa and pelvic lymph nodes will initially be treated to 45 Gy in 25 fractions of 1.8 Gy followed by a boost, to the prostate fossa only, to 68.4 Gy with 13 additional fractions of 1.8 Gy.  ________________________________  Sheral Apley Tammi Klippel, M.D.

## 2022-04-15 ENCOUNTER — Ambulatory Visit
Admission: RE | Admit: 2022-04-15 | Discharge: 2022-04-15 | Disposition: A | Discharge status: Home / Self Care | Payer: Medicare Other | Source: Ambulatory Visit | Attending: Radiation Oncology | Admitting: Radiation Oncology

## 2022-04-15 DIAGNOSIS — Z510 Encounter for antineoplastic radiation therapy: Secondary | ICD-10-CM | POA: Insufficient documentation

## 2022-04-15 DIAGNOSIS — C61 Malignant neoplasm of prostate: Secondary | ICD-10-CM | POA: Diagnosis not present

## 2022-04-15 DIAGNOSIS — R9721 Rising PSA following treatment for malignant neoplasm of prostate: Secondary | ICD-10-CM | POA: Diagnosis not present

## 2022-04-16 ENCOUNTER — Ambulatory Visit: Payer: Medicare Other | Admitting: Internal Medicine

## 2022-04-23 DIAGNOSIS — Z510 Encounter for antineoplastic radiation therapy: Secondary | ICD-10-CM | POA: Diagnosis not present

## 2022-04-23 DIAGNOSIS — R9721 Rising PSA following treatment for malignant neoplasm of prostate: Secondary | ICD-10-CM | POA: Diagnosis not present

## 2022-04-23 DIAGNOSIS — C61 Malignant neoplasm of prostate: Secondary | ICD-10-CM | POA: Diagnosis not present

## 2022-04-26 ENCOUNTER — Other Ambulatory Visit: Payer: Self-pay

## 2022-04-26 ENCOUNTER — Ambulatory Visit
Admission: RE | Admit: 2022-04-26 | Discharge: 2022-04-26 | Disposition: A | Discharge status: Home / Self Care | Payer: Medicare Other | Source: Ambulatory Visit | Attending: Radiation Oncology | Admitting: Radiation Oncology

## 2022-04-26 DIAGNOSIS — Z510 Encounter for antineoplastic radiation therapy: Secondary | ICD-10-CM | POA: Diagnosis not present

## 2022-04-26 DIAGNOSIS — R9721 Rising PSA following treatment for malignant neoplasm of prostate: Secondary | ICD-10-CM | POA: Diagnosis not present

## 2022-04-26 DIAGNOSIS — C61 Malignant neoplasm of prostate: Secondary | ICD-10-CM | POA: Diagnosis not present

## 2022-04-26 LAB — RAD ONC ARIA SESSION SUMMARY
Course Elapsed Days: 0
Plan Fractions Treated to Date: 1
Plan Prescribed Dose Per Fraction: 1.8 Gy
Plan Total Fractions Prescribed: 25
Plan Total Prescribed Dose: 45 Gy
Reference Point Dosage Given to Date: 1.8 Gy
Reference Point Session Dosage Given: 1.8 Gy
Session Number: 1

## 2022-04-27 ENCOUNTER — Ambulatory Visit
Admission: RE | Admit: 2022-04-27 | Discharge: 2022-04-27 | Disposition: A | Discharge status: Home / Self Care | Payer: Medicare Other | Source: Ambulatory Visit | Attending: Radiation Oncology | Admitting: Radiation Oncology

## 2022-04-27 ENCOUNTER — Other Ambulatory Visit: Payer: Self-pay

## 2022-04-27 ENCOUNTER — Encounter: Payer: Self-pay | Admitting: Gastroenterology

## 2022-04-27 DIAGNOSIS — C61 Malignant neoplasm of prostate: Secondary | ICD-10-CM | POA: Diagnosis not present

## 2022-04-27 DIAGNOSIS — Z510 Encounter for antineoplastic radiation therapy: Secondary | ICD-10-CM | POA: Diagnosis not present

## 2022-04-27 DIAGNOSIS — R9721 Rising PSA following treatment for malignant neoplasm of prostate: Secondary | ICD-10-CM | POA: Diagnosis not present

## 2022-04-27 LAB — RAD ONC ARIA SESSION SUMMARY
Course Elapsed Days: 1
Plan Fractions Treated to Date: 2
Plan Prescribed Dose Per Fraction: 1.8 Gy
Plan Total Fractions Prescribed: 25
Plan Total Prescribed Dose: 45 Gy
Reference Point Dosage Given to Date: 3.6 Gy
Reference Point Session Dosage Given: 1.8 Gy
Session Number: 2

## 2022-04-28 ENCOUNTER — Other Ambulatory Visit: Payer: Self-pay

## 2022-04-28 ENCOUNTER — Ambulatory Visit
Admission: RE | Admit: 2022-04-28 | Discharge: 2022-04-28 | Disposition: A | Discharge status: Home / Self Care | Payer: Medicare Other | Source: Ambulatory Visit | Attending: Radiation Oncology | Admitting: Radiation Oncology

## 2022-04-28 DIAGNOSIS — R9721 Rising PSA following treatment for malignant neoplasm of prostate: Secondary | ICD-10-CM | POA: Diagnosis not present

## 2022-04-28 DIAGNOSIS — Z510 Encounter for antineoplastic radiation therapy: Secondary | ICD-10-CM | POA: Diagnosis not present

## 2022-04-28 DIAGNOSIS — C61 Malignant neoplasm of prostate: Secondary | ICD-10-CM | POA: Diagnosis not present

## 2022-04-28 LAB — RAD ONC ARIA SESSION SUMMARY
Course Elapsed Days: 2
Plan Fractions Treated to Date: 3
Plan Prescribed Dose Per Fraction: 1.8 Gy
Plan Total Fractions Prescribed: 25
Plan Total Prescribed Dose: 45 Gy
Reference Point Dosage Given to Date: 5.4 Gy
Reference Point Session Dosage Given: 1.8 Gy
Session Number: 3

## 2022-04-29 ENCOUNTER — Other Ambulatory Visit: Payer: Self-pay

## 2022-04-29 ENCOUNTER — Ambulatory Visit
Admission: RE | Admit: 2022-04-29 | Discharge: 2022-04-29 | Disposition: A | Discharge status: Home / Self Care | Payer: Medicare Other | Source: Ambulatory Visit | Attending: Radiation Oncology | Admitting: Radiation Oncology

## 2022-04-29 DIAGNOSIS — Z510 Encounter for antineoplastic radiation therapy: Secondary | ICD-10-CM | POA: Diagnosis not present

## 2022-04-29 DIAGNOSIS — C61 Malignant neoplasm of prostate: Secondary | ICD-10-CM | POA: Diagnosis not present

## 2022-04-29 DIAGNOSIS — R9721 Rising PSA following treatment for malignant neoplasm of prostate: Secondary | ICD-10-CM | POA: Diagnosis not present

## 2022-04-29 LAB — RAD ONC ARIA SESSION SUMMARY
Course Elapsed Days: 3
Plan Fractions Treated to Date: 4
Plan Prescribed Dose Per Fraction: 1.8 Gy
Plan Total Fractions Prescribed: 25
Plan Total Prescribed Dose: 45 Gy
Reference Point Dosage Given to Date: 7.2 Gy
Reference Point Session Dosage Given: 1.8 Gy
Session Number: 4

## 2022-04-30 ENCOUNTER — Ambulatory Visit
Admission: RE | Admit: 2022-04-30 | Discharge: 2022-04-30 | Disposition: A | Discharge status: Home / Self Care | Payer: Medicare Other | Source: Ambulatory Visit | Attending: Radiation Oncology | Admitting: Radiation Oncology

## 2022-04-30 ENCOUNTER — Other Ambulatory Visit: Payer: Self-pay

## 2022-04-30 DIAGNOSIS — C61 Malignant neoplasm of prostate: Secondary | ICD-10-CM | POA: Diagnosis not present

## 2022-04-30 DIAGNOSIS — R9721 Rising PSA following treatment for malignant neoplasm of prostate: Secondary | ICD-10-CM | POA: Diagnosis not present

## 2022-04-30 DIAGNOSIS — Z510 Encounter for antineoplastic radiation therapy: Secondary | ICD-10-CM | POA: Diagnosis not present

## 2022-04-30 LAB — RAD ONC ARIA SESSION SUMMARY
Course Elapsed Days: 4
Plan Fractions Treated to Date: 5
Plan Prescribed Dose Per Fraction: 1.8 Gy
Plan Total Fractions Prescribed: 25
Plan Total Prescribed Dose: 45 Gy
Reference Point Dosage Given to Date: 9 Gy
Reference Point Session Dosage Given: 1.8 Gy
Session Number: 5

## 2022-05-03 ENCOUNTER — Ambulatory Visit
Admission: RE | Admit: 2022-05-03 | Discharge: 2022-05-03 | Disposition: A | Discharge status: Home / Self Care | Payer: Medicare Other | Source: Ambulatory Visit | Attending: Radiation Oncology | Admitting: Radiation Oncology

## 2022-05-03 ENCOUNTER — Other Ambulatory Visit: Payer: Self-pay

## 2022-05-03 DIAGNOSIS — C61 Malignant neoplasm of prostate: Secondary | ICD-10-CM | POA: Diagnosis not present

## 2022-05-03 DIAGNOSIS — Z510 Encounter for antineoplastic radiation therapy: Secondary | ICD-10-CM | POA: Diagnosis not present

## 2022-05-03 DIAGNOSIS — R9721 Rising PSA following treatment for malignant neoplasm of prostate: Secondary | ICD-10-CM | POA: Diagnosis not present

## 2022-05-03 LAB — RAD ONC ARIA SESSION SUMMARY
Course Elapsed Days: 7
Plan Fractions Treated to Date: 6
Plan Prescribed Dose Per Fraction: 1.8 Gy
Plan Total Fractions Prescribed: 25
Plan Total Prescribed Dose: 45 Gy
Reference Point Dosage Given to Date: 10.8 Gy
Reference Point Session Dosage Given: 1.8 Gy
Session Number: 6

## 2022-05-04 ENCOUNTER — Other Ambulatory Visit: Payer: Self-pay

## 2022-05-04 ENCOUNTER — Ambulatory Visit
Admission: RE | Admit: 2022-05-04 | Discharge: 2022-05-04 | Disposition: A | Discharge status: Home / Self Care | Payer: Medicare Other | Source: Ambulatory Visit | Attending: Radiation Oncology | Admitting: Radiation Oncology

## 2022-05-04 DIAGNOSIS — Z510 Encounter for antineoplastic radiation therapy: Secondary | ICD-10-CM | POA: Diagnosis not present

## 2022-05-04 DIAGNOSIS — C61 Malignant neoplasm of prostate: Secondary | ICD-10-CM | POA: Diagnosis not present

## 2022-05-04 DIAGNOSIS — R9721 Rising PSA following treatment for malignant neoplasm of prostate: Secondary | ICD-10-CM | POA: Diagnosis not present

## 2022-05-04 LAB — RAD ONC ARIA SESSION SUMMARY
Course Elapsed Days: 8
Plan Fractions Treated to Date: 7
Plan Prescribed Dose Per Fraction: 1.8 Gy
Plan Total Fractions Prescribed: 25
Plan Total Prescribed Dose: 45 Gy
Reference Point Dosage Given to Date: 12.6 Gy
Reference Point Session Dosage Given: 1.8 Gy
Session Number: 7

## 2022-05-05 ENCOUNTER — Ambulatory Visit
Admission: RE | Admit: 2022-05-05 | Discharge: 2022-05-05 | Disposition: A | Discharge status: Home / Self Care | Payer: Medicare Other | Source: Ambulatory Visit | Attending: Radiation Oncology | Admitting: Radiation Oncology

## 2022-05-05 ENCOUNTER — Other Ambulatory Visit: Payer: Self-pay

## 2022-05-05 DIAGNOSIS — R9721 Rising PSA following treatment for malignant neoplasm of prostate: Secondary | ICD-10-CM | POA: Diagnosis not present

## 2022-05-05 DIAGNOSIS — Z510 Encounter for antineoplastic radiation therapy: Secondary | ICD-10-CM | POA: Diagnosis not present

## 2022-05-05 DIAGNOSIS — C61 Malignant neoplasm of prostate: Secondary | ICD-10-CM | POA: Diagnosis not present

## 2022-05-05 LAB — RAD ONC ARIA SESSION SUMMARY
Course Elapsed Days: 9
Plan Fractions Treated to Date: 8
Plan Prescribed Dose Per Fraction: 1.8 Gy
Plan Total Fractions Prescribed: 25
Plan Total Prescribed Dose: 45 Gy
Reference Point Dosage Given to Date: 14.4 Gy
Reference Point Session Dosage Given: 1.8 Gy
Session Number: 8

## 2022-05-06 ENCOUNTER — Ambulatory Visit
Admission: RE | Admit: 2022-05-06 | Discharge: 2022-05-06 | Disposition: A | Discharge status: Home / Self Care | Payer: Medicare Other | Source: Ambulatory Visit | Attending: Radiation Oncology | Admitting: Radiation Oncology

## 2022-05-06 ENCOUNTER — Other Ambulatory Visit: Payer: Self-pay

## 2022-05-06 DIAGNOSIS — C61 Malignant neoplasm of prostate: Secondary | ICD-10-CM | POA: Diagnosis not present

## 2022-05-06 DIAGNOSIS — Z510 Encounter for antineoplastic radiation therapy: Secondary | ICD-10-CM | POA: Diagnosis not present

## 2022-05-06 DIAGNOSIS — R9721 Rising PSA following treatment for malignant neoplasm of prostate: Secondary | ICD-10-CM | POA: Diagnosis not present

## 2022-05-06 LAB — RAD ONC ARIA SESSION SUMMARY
Course Elapsed Days: 10
Plan Fractions Treated to Date: 9
Plan Prescribed Dose Per Fraction: 1.8 Gy
Plan Total Fractions Prescribed: 25
Plan Total Prescribed Dose: 45 Gy
Reference Point Dosage Given to Date: 16.2 Gy
Reference Point Session Dosage Given: 1.8 Gy
Session Number: 9

## 2022-05-07 ENCOUNTER — Ambulatory Visit
Admission: RE | Admit: 2022-05-07 | Discharge: 2022-05-07 | Disposition: A | Discharge status: Home / Self Care | Payer: Medicare Other | Source: Ambulatory Visit | Attending: Radiation Oncology | Admitting: Radiation Oncology

## 2022-05-07 ENCOUNTER — Other Ambulatory Visit: Payer: Self-pay

## 2022-05-07 DIAGNOSIS — Z510 Encounter for antineoplastic radiation therapy: Secondary | ICD-10-CM | POA: Diagnosis not present

## 2022-05-07 DIAGNOSIS — R9721 Rising PSA following treatment for malignant neoplasm of prostate: Secondary | ICD-10-CM | POA: Diagnosis not present

## 2022-05-07 DIAGNOSIS — C61 Malignant neoplasm of prostate: Secondary | ICD-10-CM | POA: Diagnosis not present

## 2022-05-07 LAB — RAD ONC ARIA SESSION SUMMARY
Course Elapsed Days: 11
Plan Fractions Treated to Date: 10
Plan Prescribed Dose Per Fraction: 1.8 Gy
Plan Total Fractions Prescribed: 25
Plan Total Prescribed Dose: 45 Gy
Reference Point Dosage Given to Date: 18 Gy
Reference Point Session Dosage Given: 1.8 Gy
Session Number: 10

## 2022-05-10 ENCOUNTER — Ambulatory Visit: Payer: Medicare Other

## 2022-05-11 ENCOUNTER — Other Ambulatory Visit: Payer: Self-pay

## 2022-05-11 ENCOUNTER — Ambulatory Visit
Admission: RE | Admit: 2022-05-11 | Discharge: 2022-05-11 | Disposition: A | Discharge status: Home / Self Care | Payer: Medicare Other | Source: Ambulatory Visit | Attending: Radiation Oncology | Admitting: Radiation Oncology

## 2022-05-11 DIAGNOSIS — C61 Malignant neoplasm of prostate: Secondary | ICD-10-CM | POA: Insufficient documentation

## 2022-05-11 DIAGNOSIS — Z510 Encounter for antineoplastic radiation therapy: Secondary | ICD-10-CM | POA: Diagnosis not present

## 2022-05-11 DIAGNOSIS — R9721 Rising PSA following treatment for malignant neoplasm of prostate: Secondary | ICD-10-CM | POA: Diagnosis not present

## 2022-05-11 LAB — RAD ONC ARIA SESSION SUMMARY
Course Elapsed Days: 15
Plan Fractions Treated to Date: 11
Plan Prescribed Dose Per Fraction: 1.8 Gy
Plan Total Fractions Prescribed: 25
Plan Total Prescribed Dose: 45 Gy
Reference Point Dosage Given to Date: 19.8 Gy
Reference Point Session Dosage Given: 1.8 Gy
Session Number: 11

## 2022-05-12 ENCOUNTER — Other Ambulatory Visit: Payer: Self-pay

## 2022-05-12 ENCOUNTER — Ambulatory Visit
Admission: RE | Admit: 2022-05-12 | Discharge: 2022-05-12 | Disposition: A | Discharge status: Home / Self Care | Payer: Medicare Other | Source: Ambulatory Visit | Attending: Radiation Oncology | Admitting: Radiation Oncology

## 2022-05-12 DIAGNOSIS — R9721 Rising PSA following treatment for malignant neoplasm of prostate: Secondary | ICD-10-CM | POA: Diagnosis not present

## 2022-05-12 DIAGNOSIS — C61 Malignant neoplasm of prostate: Secondary | ICD-10-CM | POA: Diagnosis not present

## 2022-05-12 DIAGNOSIS — Z510 Encounter for antineoplastic radiation therapy: Secondary | ICD-10-CM | POA: Diagnosis not present

## 2022-05-12 LAB — RAD ONC ARIA SESSION SUMMARY
Course Elapsed Days: 16
Plan Fractions Treated to Date: 12
Plan Prescribed Dose Per Fraction: 1.8 Gy
Plan Total Fractions Prescribed: 25
Plan Total Prescribed Dose: 45 Gy
Reference Point Dosage Given to Date: 21.6 Gy
Reference Point Session Dosage Given: 1.8 Gy
Session Number: 12

## 2022-05-13 ENCOUNTER — Other Ambulatory Visit: Payer: Self-pay

## 2022-05-13 ENCOUNTER — Ambulatory Visit
Admission: RE | Admit: 2022-05-13 | Discharge: 2022-05-13 | Disposition: A | Discharge status: Home / Self Care | Payer: Medicare Other | Source: Ambulatory Visit | Attending: Radiation Oncology | Admitting: Radiation Oncology

## 2022-05-13 DIAGNOSIS — R9721 Rising PSA following treatment for malignant neoplasm of prostate: Secondary | ICD-10-CM | POA: Diagnosis not present

## 2022-05-13 DIAGNOSIS — C61 Malignant neoplasm of prostate: Secondary | ICD-10-CM | POA: Diagnosis not present

## 2022-05-13 DIAGNOSIS — Z510 Encounter for antineoplastic radiation therapy: Secondary | ICD-10-CM | POA: Diagnosis not present

## 2022-05-13 LAB — RAD ONC ARIA SESSION SUMMARY
Course Elapsed Days: 17
Plan Fractions Treated to Date: 13
Plan Prescribed Dose Per Fraction: 1.8 Gy
Plan Total Fractions Prescribed: 25
Plan Total Prescribed Dose: 45 Gy
Reference Point Dosage Given to Date: 23.4 Gy
Reference Point Session Dosage Given: 1.8 Gy
Session Number: 13

## 2022-05-14 ENCOUNTER — Ambulatory Visit
Admission: RE | Admit: 2022-05-14 | Discharge: 2022-05-14 | Disposition: A | Discharge status: Home / Self Care | Payer: Medicare Other | Source: Ambulatory Visit | Attending: Radiation Oncology | Admitting: Radiation Oncology

## 2022-05-14 ENCOUNTER — Other Ambulatory Visit: Payer: Self-pay

## 2022-05-14 DIAGNOSIS — C61 Malignant neoplasm of prostate: Secondary | ICD-10-CM | POA: Diagnosis not present

## 2022-05-14 DIAGNOSIS — R9721 Rising PSA following treatment for malignant neoplasm of prostate: Secondary | ICD-10-CM | POA: Diagnosis not present

## 2022-05-14 DIAGNOSIS — Z510 Encounter for antineoplastic radiation therapy: Secondary | ICD-10-CM | POA: Diagnosis not present

## 2022-05-14 LAB — RAD ONC ARIA SESSION SUMMARY
Course Elapsed Days: 18
Plan Fractions Treated to Date: 14
Plan Prescribed Dose Per Fraction: 1.8 Gy
Plan Total Fractions Prescribed: 25
Plan Total Prescribed Dose: 45 Gy
Reference Point Dosage Given to Date: 25.2 Gy
Reference Point Session Dosage Given: 1.8 Gy
Session Number: 14

## 2022-05-17 ENCOUNTER — Other Ambulatory Visit: Payer: Self-pay

## 2022-05-17 ENCOUNTER — Ambulatory Visit
Admission: RE | Admit: 2022-05-17 | Discharge: 2022-05-17 | Disposition: A | Discharge status: Home / Self Care | Payer: Medicare Other | Source: Ambulatory Visit | Attending: Radiation Oncology | Admitting: Radiation Oncology

## 2022-05-17 DIAGNOSIS — R9721 Rising PSA following treatment for malignant neoplasm of prostate: Secondary | ICD-10-CM | POA: Diagnosis not present

## 2022-05-17 DIAGNOSIS — Z510 Encounter for antineoplastic radiation therapy: Secondary | ICD-10-CM | POA: Diagnosis not present

## 2022-05-17 DIAGNOSIS — C61 Malignant neoplasm of prostate: Secondary | ICD-10-CM | POA: Diagnosis not present

## 2022-05-17 LAB — RAD ONC ARIA SESSION SUMMARY
Course Elapsed Days: 21
Plan Fractions Treated to Date: 15
Plan Prescribed Dose Per Fraction: 1.8 Gy
Plan Total Fractions Prescribed: 25
Plan Total Prescribed Dose: 45 Gy
Reference Point Dosage Given to Date: 27 Gy
Reference Point Session Dosage Given: 1.8 Gy
Session Number: 15

## 2022-05-18 ENCOUNTER — Other Ambulatory Visit: Payer: Self-pay

## 2022-05-18 ENCOUNTER — Ambulatory Visit
Admission: RE | Admit: 2022-05-18 | Discharge: 2022-05-18 | Disposition: A | Discharge status: Home / Self Care | Payer: Medicare Other | Source: Ambulatory Visit | Attending: Radiation Oncology | Admitting: Radiation Oncology

## 2022-05-18 DIAGNOSIS — Z510 Encounter for antineoplastic radiation therapy: Secondary | ICD-10-CM | POA: Diagnosis not present

## 2022-05-18 DIAGNOSIS — R9721 Rising PSA following treatment for malignant neoplasm of prostate: Secondary | ICD-10-CM | POA: Diagnosis not present

## 2022-05-18 DIAGNOSIS — C61 Malignant neoplasm of prostate: Secondary | ICD-10-CM | POA: Diagnosis not present

## 2022-05-18 LAB — RAD ONC ARIA SESSION SUMMARY
Course Elapsed Days: 22
Plan Fractions Treated to Date: 16
Plan Prescribed Dose Per Fraction: 1.8 Gy
Plan Total Fractions Prescribed: 25
Plan Total Prescribed Dose: 45 Gy
Reference Point Dosage Given to Date: 28.8 Gy
Reference Point Session Dosage Given: 1.8 Gy
Session Number: 16

## 2022-05-19 ENCOUNTER — Ambulatory Visit
Admission: RE | Admit: 2022-05-19 | Discharge: 2022-05-19 | Disposition: A | Discharge status: Home / Self Care | Payer: Medicare Other | Source: Ambulatory Visit | Attending: Radiation Oncology | Admitting: Radiation Oncology

## 2022-05-19 ENCOUNTER — Other Ambulatory Visit: Payer: Self-pay

## 2022-05-19 DIAGNOSIS — R9721 Rising PSA following treatment for malignant neoplasm of prostate: Secondary | ICD-10-CM | POA: Diagnosis not present

## 2022-05-19 DIAGNOSIS — Z510 Encounter for antineoplastic radiation therapy: Secondary | ICD-10-CM | POA: Diagnosis not present

## 2022-05-19 DIAGNOSIS — C61 Malignant neoplasm of prostate: Secondary | ICD-10-CM | POA: Diagnosis not present

## 2022-05-19 LAB — RAD ONC ARIA SESSION SUMMARY
Course Elapsed Days: 23
Plan Fractions Treated to Date: 17
Plan Prescribed Dose Per Fraction: 1.8 Gy
Plan Total Fractions Prescribed: 25
Plan Total Prescribed Dose: 45 Gy
Reference Point Dosage Given to Date: 30.6 Gy
Reference Point Session Dosage Given: 1.8 Gy
Session Number: 17

## 2022-05-20 ENCOUNTER — Ambulatory Visit
Admission: RE | Admit: 2022-05-20 | Discharge: 2022-05-20 | Disposition: A | Discharge status: Home / Self Care | Payer: Medicare Other | Source: Ambulatory Visit | Attending: Radiation Oncology | Admitting: Radiation Oncology

## 2022-05-20 ENCOUNTER — Other Ambulatory Visit: Payer: Self-pay

## 2022-05-20 DIAGNOSIS — Z510 Encounter for antineoplastic radiation therapy: Secondary | ICD-10-CM | POA: Diagnosis not present

## 2022-05-20 DIAGNOSIS — R9721 Rising PSA following treatment for malignant neoplasm of prostate: Secondary | ICD-10-CM | POA: Diagnosis not present

## 2022-05-20 DIAGNOSIS — C61 Malignant neoplasm of prostate: Secondary | ICD-10-CM | POA: Diagnosis not present

## 2022-05-20 LAB — RAD ONC ARIA SESSION SUMMARY
Course Elapsed Days: 24
Plan Fractions Treated to Date: 18
Plan Prescribed Dose Per Fraction: 1.8 Gy
Plan Total Fractions Prescribed: 25
Plan Total Prescribed Dose: 45 Gy
Reference Point Dosage Given to Date: 32.4 Gy
Reference Point Session Dosage Given: 1.8 Gy
Session Number: 18

## 2022-05-21 ENCOUNTER — Other Ambulatory Visit: Payer: Self-pay

## 2022-05-21 ENCOUNTER — Ambulatory Visit
Admission: RE | Admit: 2022-05-21 | Discharge: 2022-05-21 | Disposition: A | Discharge status: Home / Self Care | Payer: Medicare Other | Source: Ambulatory Visit | Attending: Radiation Oncology | Admitting: Radiation Oncology

## 2022-05-21 DIAGNOSIS — C61 Malignant neoplasm of prostate: Secondary | ICD-10-CM | POA: Diagnosis not present

## 2022-05-21 DIAGNOSIS — Z510 Encounter for antineoplastic radiation therapy: Secondary | ICD-10-CM | POA: Diagnosis not present

## 2022-05-21 DIAGNOSIS — I495 Sick sinus syndrome: Secondary | ICD-10-CM | POA: Diagnosis not present

## 2022-05-21 DIAGNOSIS — Z45018 Encounter for adjustment and management of other part of cardiac pacemaker: Secondary | ICD-10-CM | POA: Diagnosis not present

## 2022-05-21 DIAGNOSIS — R9721 Rising PSA following treatment for malignant neoplasm of prostate: Secondary | ICD-10-CM | POA: Diagnosis not present

## 2022-05-21 LAB — RAD ONC ARIA SESSION SUMMARY
Course Elapsed Days: 25
Plan Fractions Treated to Date: 19
Plan Prescribed Dose Per Fraction: 1.8 Gy
Plan Total Fractions Prescribed: 25
Plan Total Prescribed Dose: 45 Gy
Reference Point Dosage Given to Date: 34.2 Gy
Reference Point Session Dosage Given: 1.8 Gy
Session Number: 19

## 2022-05-24 ENCOUNTER — Other Ambulatory Visit: Payer: Self-pay

## 2022-05-24 ENCOUNTER — Ambulatory Visit
Admission: RE | Admit: 2022-05-24 | Discharge: 2022-05-24 | Disposition: A | Discharge status: Home / Self Care | Payer: Medicare Other | Source: Ambulatory Visit | Attending: Radiation Oncology | Admitting: Radiation Oncology

## 2022-05-24 DIAGNOSIS — C61 Malignant neoplasm of prostate: Secondary | ICD-10-CM | POA: Diagnosis not present

## 2022-05-24 DIAGNOSIS — Z510 Encounter for antineoplastic radiation therapy: Secondary | ICD-10-CM | POA: Diagnosis not present

## 2022-05-24 DIAGNOSIS — R9721 Rising PSA following treatment for malignant neoplasm of prostate: Secondary | ICD-10-CM | POA: Diagnosis not present

## 2022-05-24 LAB — RAD ONC ARIA SESSION SUMMARY
Course Elapsed Days: 28
Plan Fractions Treated to Date: 20
Plan Prescribed Dose Per Fraction: 1.8 Gy
Plan Total Fractions Prescribed: 25
Plan Total Prescribed Dose: 45 Gy
Reference Point Dosage Given to Date: 36 Gy
Reference Point Session Dosage Given: 1.8 Gy
Session Number: 20

## 2022-05-25 ENCOUNTER — Other Ambulatory Visit: Payer: Self-pay

## 2022-05-25 ENCOUNTER — Ambulatory Visit
Admission: RE | Admit: 2022-05-25 | Discharge: 2022-05-25 | Disposition: A | Discharge status: Home / Self Care | Payer: Medicare Other | Source: Ambulatory Visit | Attending: Radiation Oncology | Admitting: Radiation Oncology

## 2022-05-25 DIAGNOSIS — C61 Malignant neoplasm of prostate: Secondary | ICD-10-CM | POA: Diagnosis not present

## 2022-05-25 DIAGNOSIS — R9721 Rising PSA following treatment for malignant neoplasm of prostate: Secondary | ICD-10-CM | POA: Diagnosis not present

## 2022-05-25 DIAGNOSIS — Z510 Encounter for antineoplastic radiation therapy: Secondary | ICD-10-CM | POA: Diagnosis not present

## 2022-05-25 LAB — RAD ONC ARIA SESSION SUMMARY
Course Elapsed Days: 29
Plan Fractions Treated to Date: 21
Plan Prescribed Dose Per Fraction: 1.8 Gy
Plan Total Fractions Prescribed: 25
Plan Total Prescribed Dose: 45 Gy
Reference Point Dosage Given to Date: 37.8 Gy
Reference Point Session Dosage Given: 1.8 Gy
Session Number: 21

## 2022-05-26 ENCOUNTER — Other Ambulatory Visit: Payer: Self-pay

## 2022-05-26 ENCOUNTER — Ambulatory Visit
Admission: RE | Admit: 2022-05-26 | Discharge: 2022-05-26 | Disposition: A | Discharge status: Home / Self Care | Payer: Medicare Other | Source: Ambulatory Visit | Attending: Radiation Oncology | Admitting: Radiation Oncology

## 2022-05-26 DIAGNOSIS — C61 Malignant neoplasm of prostate: Secondary | ICD-10-CM | POA: Diagnosis not present

## 2022-05-26 DIAGNOSIS — Z510 Encounter for antineoplastic radiation therapy: Secondary | ICD-10-CM | POA: Diagnosis not present

## 2022-05-26 DIAGNOSIS — R9721 Rising PSA following treatment for malignant neoplasm of prostate: Secondary | ICD-10-CM | POA: Diagnosis not present

## 2022-05-26 LAB — RAD ONC ARIA SESSION SUMMARY
Course Elapsed Days: 30
Plan Fractions Treated to Date: 22
Plan Prescribed Dose Per Fraction: 1.8 Gy
Plan Total Fractions Prescribed: 25
Plan Total Prescribed Dose: 45 Gy
Reference Point Dosage Given to Date: 39.6 Gy
Reference Point Session Dosage Given: 1.8 Gy
Session Number: 22

## 2022-05-27 ENCOUNTER — Other Ambulatory Visit: Payer: Self-pay

## 2022-05-27 ENCOUNTER — Ambulatory Visit
Admission: RE | Admit: 2022-05-27 | Discharge: 2022-05-27 | Disposition: A | Discharge status: Home / Self Care | Payer: Medicare Other | Source: Ambulatory Visit | Attending: Radiation Oncology | Admitting: Radiation Oncology

## 2022-05-27 DIAGNOSIS — R9721 Rising PSA following treatment for malignant neoplasm of prostate: Secondary | ICD-10-CM | POA: Diagnosis not present

## 2022-05-27 DIAGNOSIS — C61 Malignant neoplasm of prostate: Secondary | ICD-10-CM | POA: Diagnosis not present

## 2022-05-27 DIAGNOSIS — Z510 Encounter for antineoplastic radiation therapy: Secondary | ICD-10-CM | POA: Diagnosis not present

## 2022-05-27 LAB — RAD ONC ARIA SESSION SUMMARY
Course Elapsed Days: 31
Plan Fractions Treated to Date: 23
Plan Prescribed Dose Per Fraction: 1.8 Gy
Plan Total Fractions Prescribed: 25
Plan Total Prescribed Dose: 45 Gy
Reference Point Dosage Given to Date: 41.4 Gy
Reference Point Session Dosage Given: 1.8 Gy
Session Number: 23

## 2022-05-28 ENCOUNTER — Ambulatory Visit: Payer: Medicare Other

## 2022-05-31 ENCOUNTER — Other Ambulatory Visit: Payer: Self-pay

## 2022-05-31 ENCOUNTER — Ambulatory Visit: Admission: RE | Admit: 2022-05-31 | Payer: Medicare Other | Source: Ambulatory Visit

## 2022-05-31 DIAGNOSIS — C61 Malignant neoplasm of prostate: Secondary | ICD-10-CM | POA: Diagnosis not present

## 2022-05-31 DIAGNOSIS — Z510 Encounter for antineoplastic radiation therapy: Secondary | ICD-10-CM | POA: Diagnosis not present

## 2022-05-31 DIAGNOSIS — R9721 Rising PSA following treatment for malignant neoplasm of prostate: Secondary | ICD-10-CM | POA: Diagnosis not present

## 2022-05-31 LAB — RAD ONC ARIA SESSION SUMMARY
Course Elapsed Days: 35
Plan Fractions Treated to Date: 24
Plan Prescribed Dose Per Fraction: 1.8 Gy
Plan Total Fractions Prescribed: 25
Plan Total Prescribed Dose: 45 Gy
Reference Point Dosage Given to Date: 43.2 Gy
Reference Point Session Dosage Given: 1.8 Gy
Session Number: 24

## 2022-06-01 ENCOUNTER — Ambulatory Visit: Payer: Medicare Other

## 2022-06-01 ENCOUNTER — Other Ambulatory Visit: Payer: Self-pay

## 2022-06-01 DIAGNOSIS — C61 Malignant neoplasm of prostate: Secondary | ICD-10-CM | POA: Diagnosis not present

## 2022-06-01 DIAGNOSIS — Z510 Encounter for antineoplastic radiation therapy: Secondary | ICD-10-CM | POA: Diagnosis not present

## 2022-06-01 DIAGNOSIS — R9721 Rising PSA following treatment for malignant neoplasm of prostate: Secondary | ICD-10-CM | POA: Diagnosis not present

## 2022-06-01 LAB — RAD ONC ARIA SESSION SUMMARY
Course Elapsed Days: 36
Plan Fractions Treated to Date: 25
Plan Prescribed Dose Per Fraction: 1.8 Gy
Plan Total Fractions Prescribed: 25
Plan Total Prescribed Dose: 45 Gy
Reference Point Dosage Given to Date: 45 Gy
Reference Point Session Dosage Given: 1.8 Gy
Session Number: 25

## 2022-06-02 ENCOUNTER — Ambulatory Visit: Payer: Medicare Other

## 2022-06-02 ENCOUNTER — Other Ambulatory Visit: Payer: Self-pay

## 2022-06-02 DIAGNOSIS — C61 Malignant neoplasm of prostate: Secondary | ICD-10-CM | POA: Diagnosis not present

## 2022-06-02 DIAGNOSIS — Z510 Encounter for antineoplastic radiation therapy: Secondary | ICD-10-CM | POA: Diagnosis not present

## 2022-06-02 LAB — RAD ONC ARIA SESSION SUMMARY
Course Elapsed Days: 37
Plan Fractions Treated to Date: 1
Plan Prescribed Dose Per Fraction: 1.8 Gy
Plan Total Fractions Prescribed: 13
Plan Total Prescribed Dose: 23.4 Gy
Reference Point Dosage Given to Date: 46.8 Gy
Reference Point Session Dosage Given: 1.8 Gy
Session Number: 26

## 2022-06-03 ENCOUNTER — Ambulatory Visit
Admission: RE | Admit: 2022-06-03 | Discharge: 2022-06-03 | Disposition: A | Discharge status: Home / Self Care | Payer: Medicare Other | Source: Ambulatory Visit | Attending: Radiation Oncology | Admitting: Radiation Oncology

## 2022-06-03 ENCOUNTER — Other Ambulatory Visit: Payer: Self-pay

## 2022-06-03 DIAGNOSIS — C61 Malignant neoplasm of prostate: Secondary | ICD-10-CM | POA: Diagnosis not present

## 2022-06-03 DIAGNOSIS — Z510 Encounter for antineoplastic radiation therapy: Secondary | ICD-10-CM | POA: Diagnosis not present

## 2022-06-03 LAB — RAD ONC ARIA SESSION SUMMARY
Course Elapsed Days: 38
Plan Fractions Treated to Date: 2
Plan Prescribed Dose Per Fraction: 1.8 Gy
Plan Total Fractions Prescribed: 13
Plan Total Prescribed Dose: 23.4 Gy
Reference Point Dosage Given to Date: 48.6 Gy
Reference Point Session Dosage Given: 1.8 Gy
Session Number: 27

## 2022-06-04 ENCOUNTER — Ambulatory Visit
Admission: RE | Admit: 2022-06-04 | Discharge: 2022-06-04 | Disposition: A | Discharge status: Home / Self Care | Payer: Medicare Other | Source: Ambulatory Visit | Attending: Radiation Oncology | Admitting: Radiation Oncology

## 2022-06-04 ENCOUNTER — Other Ambulatory Visit: Payer: Self-pay

## 2022-06-04 DIAGNOSIS — Z510 Encounter for antineoplastic radiation therapy: Secondary | ICD-10-CM | POA: Diagnosis not present

## 2022-06-04 DIAGNOSIS — C61 Malignant neoplasm of prostate: Secondary | ICD-10-CM | POA: Diagnosis not present

## 2022-06-04 LAB — RAD ONC ARIA SESSION SUMMARY
Course Elapsed Days: 39
Plan Fractions Treated to Date: 3
Plan Prescribed Dose Per Fraction: 1.8 Gy
Plan Total Fractions Prescribed: 13
Plan Total Prescribed Dose: 23.4 Gy
Reference Point Dosage Given to Date: 50.4 Gy
Reference Point Session Dosage Given: 1.8 Gy
Session Number: 28

## 2022-06-07 ENCOUNTER — Other Ambulatory Visit: Payer: Self-pay

## 2022-06-07 ENCOUNTER — Ambulatory Visit
Admission: RE | Admit: 2022-06-07 | Discharge: 2022-06-07 | Disposition: A | Discharge status: Home / Self Care | Payer: Medicare Other | Source: Ambulatory Visit | Attending: Radiation Oncology | Admitting: Radiation Oncology

## 2022-06-07 DIAGNOSIS — Z510 Encounter for antineoplastic radiation therapy: Secondary | ICD-10-CM | POA: Diagnosis not present

## 2022-06-07 DIAGNOSIS — C61 Malignant neoplasm of prostate: Secondary | ICD-10-CM | POA: Diagnosis not present

## 2022-06-07 LAB — RAD ONC ARIA SESSION SUMMARY
Course Elapsed Days: 42
Plan Fractions Treated to Date: 4
Plan Prescribed Dose Per Fraction: 1.8 Gy
Plan Total Fractions Prescribed: 13
Plan Total Prescribed Dose: 23.4 Gy
Reference Point Dosage Given to Date: 52.2 Gy
Reference Point Session Dosage Given: 1.8 Gy
Session Number: 29

## 2022-06-08 ENCOUNTER — Other Ambulatory Visit: Payer: Self-pay

## 2022-06-08 ENCOUNTER — Ambulatory Visit
Admission: RE | Admit: 2022-06-08 | Discharge: 2022-06-08 | Disposition: A | Discharge status: Home / Self Care | Payer: Medicare Other | Source: Ambulatory Visit | Attending: Radiation Oncology | Admitting: Radiation Oncology

## 2022-06-08 DIAGNOSIS — R9721 Rising PSA following treatment for malignant neoplasm of prostate: Secondary | ICD-10-CM | POA: Diagnosis not present

## 2022-06-08 DIAGNOSIS — C61 Malignant neoplasm of prostate: Secondary | ICD-10-CM | POA: Diagnosis not present

## 2022-06-08 DIAGNOSIS — Z510 Encounter for antineoplastic radiation therapy: Secondary | ICD-10-CM | POA: Diagnosis not present

## 2022-06-08 LAB — RAD ONC ARIA SESSION SUMMARY
Course Elapsed Days: 43
Plan Fractions Treated to Date: 5
Plan Prescribed Dose Per Fraction: 1.8 Gy
Plan Total Fractions Prescribed: 13
Plan Total Prescribed Dose: 23.4 Gy
Reference Point Dosage Given to Date: 54 Gy
Reference Point Session Dosage Given: 1.8 Gy
Session Number: 30

## 2022-06-09 ENCOUNTER — Other Ambulatory Visit: Payer: Self-pay

## 2022-06-09 ENCOUNTER — Ambulatory Visit
Admission: RE | Admit: 2022-06-09 | Discharge: 2022-06-09 | Disposition: A | Discharge status: Home / Self Care | Payer: Medicare Other | Source: Ambulatory Visit | Attending: Radiation Oncology | Admitting: Radiation Oncology

## 2022-06-09 DIAGNOSIS — Z510 Encounter for antineoplastic radiation therapy: Secondary | ICD-10-CM | POA: Diagnosis not present

## 2022-06-09 DIAGNOSIS — C61 Malignant neoplasm of prostate: Secondary | ICD-10-CM | POA: Diagnosis not present

## 2022-06-09 LAB — RAD ONC ARIA SESSION SUMMARY
Course Elapsed Days: 44
Plan Fractions Treated to Date: 6
Plan Prescribed Dose Per Fraction: 1.8 Gy
Plan Total Fractions Prescribed: 13
Plan Total Prescribed Dose: 23.4 Gy
Reference Point Dosage Given to Date: 55.8 Gy
Reference Point Session Dosage Given: 1.8 Gy
Session Number: 31

## 2022-06-10 ENCOUNTER — Other Ambulatory Visit: Payer: Self-pay

## 2022-06-10 ENCOUNTER — Ambulatory Visit
Admission: RE | Admit: 2022-06-10 | Discharge: 2022-06-10 | Disposition: A | Discharge status: Home / Self Care | Payer: Medicare Other | Source: Ambulatory Visit | Attending: Radiation Oncology | Admitting: Radiation Oncology

## 2022-06-10 DIAGNOSIS — C61 Malignant neoplasm of prostate: Secondary | ICD-10-CM | POA: Diagnosis not present

## 2022-06-10 DIAGNOSIS — Z510 Encounter for antineoplastic radiation therapy: Secondary | ICD-10-CM | POA: Diagnosis not present

## 2022-06-10 LAB — RAD ONC ARIA SESSION SUMMARY
Course Elapsed Days: 45
Plan Fractions Treated to Date: 7
Plan Prescribed Dose Per Fraction: 1.8 Gy
Plan Total Fractions Prescribed: 13
Plan Total Prescribed Dose: 23.4 Gy
Reference Point Dosage Given to Date: 57.6 Gy
Reference Point Session Dosage Given: 1.8 Gy
Session Number: 32

## 2022-06-11 ENCOUNTER — Other Ambulatory Visit: Payer: Self-pay

## 2022-06-11 ENCOUNTER — Ambulatory Visit
Admission: RE | Admit: 2022-06-11 | Discharge: 2022-06-11 | Disposition: A | Discharge status: Home / Self Care | Payer: Medicare Other | Source: Ambulatory Visit | Attending: Radiation Oncology | Admitting: Radiation Oncology

## 2022-06-11 DIAGNOSIS — Z510 Encounter for antineoplastic radiation therapy: Secondary | ICD-10-CM | POA: Diagnosis not present

## 2022-06-11 DIAGNOSIS — C61 Malignant neoplasm of prostate: Secondary | ICD-10-CM | POA: Insufficient documentation

## 2022-06-11 LAB — RAD ONC ARIA SESSION SUMMARY
Course Elapsed Days: 46
Plan Fractions Treated to Date: 8
Plan Prescribed Dose Per Fraction: 1.8 Gy
Plan Total Fractions Prescribed: 13
Plan Total Prescribed Dose: 23.4 Gy
Reference Point Dosage Given to Date: 59.4 Gy
Reference Point Session Dosage Given: 1.8 Gy
Session Number: 33

## 2022-06-15 ENCOUNTER — Other Ambulatory Visit: Payer: Self-pay

## 2022-06-15 ENCOUNTER — Ambulatory Visit
Admission: RE | Admit: 2022-06-15 | Discharge: 2022-06-15 | Disposition: A | Discharge status: Home / Self Care | Payer: Medicare Other | Source: Ambulatory Visit | Attending: Radiation Oncology | Admitting: Radiation Oncology

## 2022-06-15 DIAGNOSIS — C61 Malignant neoplasm of prostate: Secondary | ICD-10-CM | POA: Diagnosis not present

## 2022-06-15 DIAGNOSIS — Z510 Encounter for antineoplastic radiation therapy: Secondary | ICD-10-CM | POA: Diagnosis not present

## 2022-06-15 LAB — RAD ONC ARIA SESSION SUMMARY
Course Elapsed Days: 50
Plan Fractions Treated to Date: 9
Plan Prescribed Dose Per Fraction: 1.8 Gy
Plan Total Fractions Prescribed: 13
Plan Total Prescribed Dose: 23.4 Gy
Reference Point Dosage Given to Date: 61.2 Gy
Reference Point Session Dosage Given: 1.8 Gy
Session Number: 34

## 2022-06-15 NOTE — Progress Notes (Signed)
RN left voicemail for call back regarding establishing care @ [facility].

## 2022-06-16 ENCOUNTER — Ambulatory Visit
Admission: RE | Admit: 2022-06-16 | Discharge: 2022-06-16 | Disposition: A | Discharge status: Home / Self Care | Payer: Medicare Other | Source: Ambulatory Visit | Attending: Radiation Oncology | Admitting: Radiation Oncology

## 2022-06-16 ENCOUNTER — Other Ambulatory Visit: Payer: Self-pay

## 2022-06-16 DIAGNOSIS — R9721 Rising PSA following treatment for malignant neoplasm of prostate: Secondary | ICD-10-CM | POA: Diagnosis not present

## 2022-06-16 DIAGNOSIS — C61 Malignant neoplasm of prostate: Secondary | ICD-10-CM | POA: Diagnosis not present

## 2022-06-16 DIAGNOSIS — Z510 Encounter for antineoplastic radiation therapy: Secondary | ICD-10-CM | POA: Diagnosis not present

## 2022-06-16 LAB — RAD ONC ARIA SESSION SUMMARY
Course Elapsed Days: 51
Plan Fractions Treated to Date: 10
Plan Prescribed Dose Per Fraction: 1.8 Gy
Plan Total Fractions Prescribed: 13
Plan Total Prescribed Dose: 23.4 Gy
Reference Point Dosage Given to Date: 63 Gy
Reference Point Session Dosage Given: 1.8 Gy
Session Number: 35

## 2022-06-17 ENCOUNTER — Ambulatory Visit
Admission: RE | Admit: 2022-06-17 | Discharge: 2022-06-17 | Disposition: A | Discharge status: Home / Self Care | Payer: Medicare Other | Source: Ambulatory Visit | Attending: Radiation Oncology | Admitting: Radiation Oncology

## 2022-06-17 ENCOUNTER — Ambulatory Visit: Payer: Medicare Other

## 2022-06-17 ENCOUNTER — Other Ambulatory Visit: Payer: Self-pay

## 2022-06-17 DIAGNOSIS — Z510 Encounter for antineoplastic radiation therapy: Secondary | ICD-10-CM | POA: Diagnosis not present

## 2022-06-17 DIAGNOSIS — C61 Malignant neoplasm of prostate: Secondary | ICD-10-CM | POA: Diagnosis not present

## 2022-06-17 LAB — RAD ONC ARIA SESSION SUMMARY
Course Elapsed Days: 52
Plan Fractions Treated to Date: 11
Plan Prescribed Dose Per Fraction: 1.8 Gy
Plan Total Fractions Prescribed: 13
Plan Total Prescribed Dose: 23.4 Gy
Reference Point Dosage Given to Date: 64.8 Gy
Reference Point Session Dosage Given: 1.8 Gy
Session Number: 36

## 2022-06-18 ENCOUNTER — Ambulatory Visit: Payer: Medicare Other

## 2022-06-18 ENCOUNTER — Ambulatory Visit
Admission: RE | Admit: 2022-06-18 | Discharge: 2022-06-18 | Disposition: A | Discharge status: Home / Self Care | Payer: Medicare Other | Source: Ambulatory Visit | Attending: Radiation Oncology | Admitting: Radiation Oncology

## 2022-06-18 ENCOUNTER — Other Ambulatory Visit: Payer: Self-pay

## 2022-06-18 DIAGNOSIS — C61 Malignant neoplasm of prostate: Secondary | ICD-10-CM | POA: Diagnosis not present

## 2022-06-18 DIAGNOSIS — Z510 Encounter for antineoplastic radiation therapy: Secondary | ICD-10-CM | POA: Diagnosis not present

## 2022-06-18 LAB — RAD ONC ARIA SESSION SUMMARY
Course Elapsed Days: 53
Plan Fractions Treated to Date: 12
Plan Prescribed Dose Per Fraction: 1.8 Gy
Plan Total Fractions Prescribed: 13
Plan Total Prescribed Dose: 23.4 Gy
Reference Point Dosage Given to Date: 66.6 Gy
Reference Point Session Dosage Given: 1.8 Gy
Session Number: 37

## 2022-06-21 ENCOUNTER — Ambulatory Visit
Admission: RE | Admit: 2022-06-21 | Discharge: 2022-06-21 | Disposition: A | Discharge status: Home / Self Care | Payer: Medicare Other | Source: Ambulatory Visit | Attending: Radiation Oncology | Admitting: Radiation Oncology

## 2022-06-21 ENCOUNTER — Encounter: Payer: Self-pay | Admitting: Urology

## 2022-06-21 ENCOUNTER — Other Ambulatory Visit: Payer: Self-pay

## 2022-06-21 DIAGNOSIS — Z510 Encounter for antineoplastic radiation therapy: Secondary | ICD-10-CM | POA: Diagnosis not present

## 2022-06-21 DIAGNOSIS — C61 Malignant neoplasm of prostate: Secondary | ICD-10-CM

## 2022-06-21 DIAGNOSIS — R9721 Rising PSA following treatment for malignant neoplasm of prostate: Secondary | ICD-10-CM | POA: Diagnosis not present

## 2022-06-21 LAB — RAD ONC ARIA SESSION SUMMARY
Course Elapsed Days: 56
Plan Fractions Treated to Date: 13
Plan Prescribed Dose Per Fraction: 1.8 Gy
Plan Total Fractions Prescribed: 13
Plan Total Prescribed Dose: 23.4 Gy
Reference Point Dosage Given to Date: 68.4 Gy
Reference Point Session Dosage Given: 1.8 Gy
Session Number: 38

## 2022-06-21 NOTE — Progress Notes (Signed)
Patient presented for final radiation treatment today.    RN spoke with patient to congratulate and assess any needs.   Patient is established with Dr. Jeffie Pollock @ [facility] and is scheduled for labs on 9/20.  I explained to patient this lab appointment may be recommended by Dr. Jeffie Pollock to be pushed out due to completing treatment on 9/11.  Verbalized understanding.  RN will reach out to Dr. Jeffie Pollock regarding completing treatment and post treatment follow ups.

## 2022-06-22 NOTE — Progress Notes (Signed)
Dr. Jeffie Pollock recommendations for patient to push back upcoming September appointments due to recently completing radiation treatment.   RN left voicemail with patient updating on recommendations.  Contact information left for [facility] and myself.

## 2022-06-29 NOTE — Progress Notes (Signed)
RN spoke with patient to confirm he received information regarding changing date of upcoming PSA due to recently finishing radiation treatment.   Pt is scheduled at [facility] on 9/20 for PSA check.   Pt confirmed he will call today to have appointment rescheduled closer to end of the year per Dr. Ralene Muskrat recommendations.

## 2022-07-24 ENCOUNTER — Other Ambulatory Visit: Payer: Self-pay | Admitting: Cardiology

## 2022-07-24 DIAGNOSIS — G9332 Myalgic encephalomyelitis/chronic fatigue syndrome: Secondary | ICD-10-CM

## 2022-07-26 ENCOUNTER — Other Ambulatory Visit: Payer: Self-pay | Admitting: Cardiology

## 2022-07-26 DIAGNOSIS — M797 Fibromyalgia: Secondary | ICD-10-CM

## 2022-07-26 DIAGNOSIS — G9332 Myalgic encephalomyelitis/chronic fatigue syndrome: Secondary | ICD-10-CM

## 2022-07-27 ENCOUNTER — Other Ambulatory Visit: Payer: Self-pay | Admitting: Cardiology

## 2022-07-27 ENCOUNTER — Telehealth: Payer: Self-pay

## 2022-07-27 ENCOUNTER — Other Ambulatory Visit: Payer: Self-pay

## 2022-07-28 ENCOUNTER — Telehealth: Payer: Self-pay

## 2022-07-28 DIAGNOSIS — M797 Fibromyalgia: Secondary | ICD-10-CM

## 2022-07-28 MED ORDER — PREGABALIN 150 MG PO CAPS: ORAL_CAPSULE | ORAL | 3 refills | Status: DC

## 2022-07-28 NOTE — Telephone Encounter (Signed)
error 

## 2022-08-03 NOTE — Progress Notes (Signed)
  Radiation Oncology         (336) [phone] ________________________________  Name: Manuel Escobar MRN: [id number]  Date of Service: 08/04/2022  DOB: 03/25/1946  Post Treatment Telephone Note  Diagnosis:  76 y.o. 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 (as documented in provider EOT note).  Pre Treatment IPSS Score: 0 (as documented in the provider consult note).   The patient was available for call today.   Symptoms of fatigue have improved since completing therapy.  Symptoms of bladder changes have  improved since completing therapy. Current symptoms include none and medications for bladder symptoms include none.  Symptoms of bowel changes have  improved since completing therapy. Current symptoms include none, and medications for bowel symptoms include none.     Post Treatment IPSS Score: IPSS Questionnaire (AUA-7): Over the past month.   1)  How often have you had a sensation of not emptying your bladder completely after you finish urinating?  0 - Not at all  2)  How often have you had to urinate again less than two hours after you finished urinating? 0 - Not at all  3)  How often have you found you stopped and started again several times when you urinated?  0 - Not at all  4) How difficult have you found it to postpone urination?  0 - Not at all  5) How often have you had a weak urinary stream?  0 - Not at all  6) How often have you had to push or strain to begin urination?  0 - Not at all  7) How many times did you most typically get up to urinate from the time you went to bed until the time you got up in the morning?  0 - None  Total score:  0 Which indicates mild symptoms  0-7 mildly symptomatic   8-19 moderately symptomatic   20-35 severely symptomatic    Patient was advised to call to schedule their post-treatment follow up with his urologist Dr. Irine Seal  for ongoing surveillance. He was counseled that PSA levels will be drawn in his urologist office, and was reassured that additional time is expected to improve bowel and bladder symptoms. He was encouraged to call back with concerns or questions regarding radiation.   This concludes the nursing interview.   Leandra Kern, LPN

## 2022-08-04 ENCOUNTER — Ambulatory Visit
Admission: RE | Admit: 2022-08-04 | Discharge: 2022-08-04 | Disposition: A | Discharge status: Home / Self Care | Payer: Medicare Other | Source: Ambulatory Visit | Attending: Urology | Admitting: Urology

## 2022-08-04 ENCOUNTER — Other Ambulatory Visit: Payer: Self-pay | Admitting: Urology

## 2022-08-04 DIAGNOSIS — C61 Malignant neoplasm of prostate: Secondary | ICD-10-CM

## 2022-08-04 NOTE — Progress Notes (Signed)
  Radiation Oncology         (336) [phone] ________________________________  Name: Manuel Escobar MRN: [id number]  Date: 06/21/2022  DOB: Sep 06, 1946  End of Treatment Note  Diagnosis:   76 y.o. 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      Indication for treatment:  Curative, Definitive Radiotherapy       Radiation treatment dates:   04/26/22 - 06/21/22  Site/dose:  1. The prostate fossa and pelvic lymph nodes were initially treated to 45 Gy in 25 fractions of 1.8 Gy  2. The prostate fossa only was boosted to 68.4 Gy with 13 additional fractions of 1.8 Gy   Beams/energy:  1. The prostate fossa  and pelvic lymph nodes were initially treated using VMAT intensity modulated radiotherapy delivering 6 megavolt photons. Image guidance was performed with CB-CT studies prior to each fraction. He was immobilized with a body fix lower extremity mold.  2. The prostate fossa only was boosted using VMAT intensity modulated radiotherapy delivering 6 megavolt photons. Image guidance was performed with CB-CT studies prior to each fraction. He was immobilized with a body fix lower extremity mold.  Narrative: The patient tolerated radiation treatment relatively well with only minor urinary irritation and modest fatigue.  He reported some increased nocturia and urgency but denied dysuria, gross hematuria, straining to void or incontinence.  He also experienced some loose stools/diarrhea but did not require any medications for management.  Plan: The patient has completed radiation treatment. He will return to radiation oncology clinic for routine followup in one month. I advised him to call or return sooner if he has any questions or concerns related to his recovery or treatment. ________________________________  Sheral Apley. Tammi Klippel, M.D.

## 2022-08-11 DIAGNOSIS — M545 Low back pain, unspecified: Secondary | ICD-10-CM | POA: Diagnosis not present

## 2022-08-20 DIAGNOSIS — Z45018 Encounter for adjustment and management of other part of cardiac pacemaker: Secondary | ICD-10-CM | POA: Diagnosis not present

## 2022-08-20 DIAGNOSIS — I495 Sick sinus syndrome: Secondary | ICD-10-CM | POA: Diagnosis not present

## 2022-08-31 DIAGNOSIS — I10 Essential (primary) hypertension: Secondary | ICD-10-CM | POA: Diagnosis not present

## 2022-08-31 DIAGNOSIS — F329 Major depressive disorder, single episode, unspecified: Secondary | ICD-10-CM | POA: Diagnosis not present

## 2022-08-31 DIAGNOSIS — G4733 Obstructive sleep apnea (adult) (pediatric): Secondary | ICD-10-CM | POA: Diagnosis not present

## 2022-08-31 DIAGNOSIS — Z8546 Personal history of malignant neoplasm of prostate: Secondary | ICD-10-CM | POA: Diagnosis not present

## 2022-08-31 DIAGNOSIS — Z0000 Encounter for general adult medical examination without abnormal findings: Secondary | ICD-10-CM | POA: Diagnosis not present

## 2022-08-31 DIAGNOSIS — Z23 Encounter for immunization: Secondary | ICD-10-CM | POA: Diagnosis not present

## 2022-08-31 DIAGNOSIS — Z950 Presence of cardiac pacemaker: Secondary | ICD-10-CM | POA: Diagnosis not present

## 2022-08-31 DIAGNOSIS — E782 Mixed hyperlipidemia: Secondary | ICD-10-CM | POA: Diagnosis not present

## 2022-09-06 ENCOUNTER — Other Ambulatory Visit: Payer: Self-pay | Admitting: Cardiology

## 2022-10-01 DIAGNOSIS — Z8546 Personal history of malignant neoplasm of prostate: Secondary | ICD-10-CM | POA: Diagnosis not present

## 2022-10-08 DIAGNOSIS — C61 Malignant neoplasm of prostate: Secondary | ICD-10-CM | POA: Diagnosis not present

## 2022-10-19 NOTE — Telephone Encounter (Signed)
error 

## 2022-10-21 ENCOUNTER — Encounter: Payer: Self-pay | Admitting: *Deleted

## 2022-11-04 ENCOUNTER — Encounter: Payer: Self-pay | Admitting: *Deleted

## 2022-11-19 DIAGNOSIS — I495 Sick sinus syndrome: Secondary | ICD-10-CM | POA: Diagnosis not present

## 2022-11-19 DIAGNOSIS — Z45018 Encounter for adjustment and management of other part of cardiac pacemaker: Secondary | ICD-10-CM | POA: Diagnosis not present

## 2022-12-16 DIAGNOSIS — G4733 Obstructive sleep apnea (adult) (pediatric): Secondary | ICD-10-CM | POA: Diagnosis not present

## 2022-12-17 ENCOUNTER — Encounter: Payer: Self-pay | Admitting: Cardiology

## 2022-12-17 ENCOUNTER — Ambulatory Visit: Payer: Medicare Other | Admitting: Cardiology

## 2022-12-17 VITALS — BP 128/82 | HR 80 | Resp 16 | Ht 70.0 in | Wt 231.0 lb

## 2022-12-17 DIAGNOSIS — I495 Sick sinus syndrome: Secondary | ICD-10-CM

## 2022-12-17 DIAGNOSIS — I6523 Occlusion and stenosis of bilateral carotid arteries: Secondary | ICD-10-CM | POA: Diagnosis not present

## 2022-12-17 DIAGNOSIS — Z45018 Encounter for adjustment and management of other part of cardiac pacemaker: Secondary | ICD-10-CM | POA: Diagnosis not present

## 2022-12-17 DIAGNOSIS — Z953 Presence of xenogenic heart valve: Secondary | ICD-10-CM

## 2022-12-17 DIAGNOSIS — Z950 Presence of cardiac pacemaker: Secondary | ICD-10-CM
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
## 2022-12-22 ENCOUNTER — Ambulatory Visit: Payer: Medicare Other

## 2022-12-22 DIAGNOSIS — I6523 Occlusion and stenosis of bilateral carotid arteries: Secondary | ICD-10-CM

## 2023-01-24 ENCOUNTER — Other Ambulatory Visit: Payer: Self-pay | Admitting: Cardiology

## 2023-01-26 DIAGNOSIS — Z23 Encounter for immunization: Secondary | ICD-10-CM | POA: Diagnosis not present

## 2023-01-27 ENCOUNTER — Other Ambulatory Visit: Payer: Self-pay | Admitting: Cardiology

## 2023-01-27 DIAGNOSIS — G4733 Obstructive sleep apnea (adult) (pediatric): Secondary | ICD-10-CM | POA: Diagnosis not present

## 2023-01-27 NOTE — Telephone Encounter (Signed)
Refill request. Would you send this medication to his pharmacy.

## 2023-02-01 DIAGNOSIS — M5451 Vertebrogenic low back pain: Secondary | ICD-10-CM | POA: Diagnosis not present

## 2023-02-02 DIAGNOSIS — R9721 Rising PSA following treatment for malignant neoplasm of prostate: Secondary | ICD-10-CM | POA: Diagnosis not present

## 2023-02-18 DIAGNOSIS — I495 Sick sinus syndrome: Secondary | ICD-10-CM | POA: Diagnosis not present

## 2023-02-18 DIAGNOSIS — Z45018 Encounter for adjustment and management of other part of cardiac pacemaker: Secondary | ICD-10-CM | POA: Diagnosis not present

## 2023-02-21 DIAGNOSIS — M791 Myalgia, unspecified site: Secondary | ICD-10-CM | POA: Diagnosis not present

## 2023-02-21 DIAGNOSIS — M47816 Spondylosis without myelopathy or radiculopathy, lumbar region: Secondary | ICD-10-CM | POA: Diagnosis not present

## 2023-03-15 ENCOUNTER — Other Ambulatory Visit: Payer: Self-pay | Admitting: Cardiology

## 2023-03-29 ENCOUNTER — Ambulatory Visit (HOSPITAL_BASED_OUTPATIENT_CLINIC_OR_DEPARTMENT_OTHER): Payer: Medicare Other | Attending: Orthopedic Surgery | Admitting: Physical Therapy

## 2023-03-29 ENCOUNTER — Other Ambulatory Visit: Payer: Self-pay

## 2023-03-29 DIAGNOSIS — M5459 Other low back pain: Secondary | ICD-10-CM | POA: Diagnosis not present

## 2023-03-29 DIAGNOSIS — R2689 Other abnormalities of gait and mobility: Secondary | ICD-10-CM | POA: Insufficient documentation

## 2023-03-29 NOTE — Therapy (Signed)
OUTPATIENT PHYSICAL THERAPY THORACOLUMBAR EVALUATION   Patient Name: Manuel Escobar MRN: [id number] DOB:[birth year], 77 y.o., male Today's Date: 03/29/2023  END OF SESSION:  PT End of Session - 03/29/23 1448     Visit Number 1    Number of Visits 8    Date for PT Re-Evaluation 05/24/23    PT Start Time 1015    PT Stop Time 1108    PT Time Calculation (min) 53 min             Past Medical History:  Diagnosis Date   Alcoholism (HCC)    Anxiety    Aortic stenosis 08/14/2017   Cardiac syncope 04/28/2019   Depression    Encounter for care of pacemaker 04/28/2019   History of pacemaker    Hypertension    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HCC)    Sinus node dysfunction (HCC) 04/28/2019   Past Surgical History:  Procedure Laterality Date   APPENDECTOMY     CORONARY PRESSURE/FFR STUDY N/A 08/30/2017   Procedure: INTRAVASCULAR PRESSURE WIRE/FFR STUDY;  Surgeon: Elder Negus, MD;  Location: [facility];  Service: Cardiovascular;  Laterality: N/A;   PACEMAKER INSERTION  04/13/2014   PROSTATECTOMY     RIGHT/LEFT HEART CATH AND CORONARY ANGIOGRAPHY N/A 08/30/2017   Procedure: RIGHT/LEFT HEART CATH AND CORONARY ANGIOGRAPHY;  Surgeon: Elder Negus, MD;  Location: [facility];  Service: Cardiovascular;  Laterality: N/A;   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   Scalp   TEE WITHOUT CARDIOVERSION N/A 08/18/2017   Procedure: TRANSESOPHAGEAL ECHOCARDIOGRAM (TEE);  Surgeon: Yates Decamp, MD;  Location: [facility] ENDOS[facility];  Service: Cardiovascular;  Laterality: N/A;   Patient Active Problem List   Diagnosis Date Noted   Pure hypercholesterolemia 03/01/2022   Recurrent prostate adenocarcinoma (HCC) 03/01/2022   Obesity 03/01/2022   Mixed hyperlipidemia 03/01/2022   History of malignant neoplasm of prostate 03/01/2022   Personal history of colonic polyps 03/01/2022   History of  cardiac arrest 03/01/2022   History of alcohol abuse 03/01/2022   H/O alcohol dependence (HCC) 03/01/2022   Fibromyalgia 03/01/2022   Essential hypertension 03/01/2022   Depression 03/01/2022   Carotid atherosclerosis 03/01/2022   Valvular heart disease 03/01/2022   Anxiety 03/01/2022   Chronic intermittent hypoxia with obstructive sleep apnea 09/14/2020   Chronic fatigue syndrome with fibromyalgia 08/28/2020   Witnessed episode of apnea 08/28/2020   Non-restorative sleep 08/28/2020   Severe obstructive sleep apnea-hypopnea syndrome 08/28/2020   Cardiac syncope 04/28/2019   Sinus node dysfunction (HCC) 04/28/2019   Encounter for care of pacemaker 04/28/2019   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 Henrine Screws  REFERRING PROVIDER: Dr. Venita Lick  REFERRING DIAG: Diagnosis  M54.51 (ICD-10-CM) - Vertebrogenic low back pain     Rationale for Evaluation and Treatment: Rehabilitation  THERAPY DIAG:  Other low back pain  Other abnormalities of gait and mobility  ONSET DATE: Long history of low back pain  SUBJECTIVE:  SUBJECTIVE STATEMENT: Patient reports a long history of right-sided lower back pain.  The pain has increased over time.  At this point he feels like he cannot work more than 250 yards before he has an increase in right-sided lower back pain.  He was an avid runner in the past.  PERTINENT HISTORY:  Anxiety, aortic stenosis, depression, pacemaker, recurrent prostate cancer.  PAIN:  Are you having pain? Yes: NPRS scale: 5-6 when standing/10 Pain location: Right-sided low back Pain description: Aching Aggravating factors: Standing and walking Relieving factors:  Sitting  PRECAUTIONS: ICD/Pacemaker  WEIGHT BEARING RESTRICTIONS: No  FALLS:  Has patient fallen in last 6 months? No  LIVING ENVIRONMENT:  OCCUPATION:  Retired Psychologist, sport and exercise  Hobbies: Tries to stay active  PLOF: Independent  PATIENT GOALS: To develop plan to improve pain  NEXT MD VISIT:  Nothing scheduled  OBJECTIVE:   DIAGNOSTIC FINDINGS:  Lumbar CT 2021Narrative & Impression  CLINICAL DATA:  77 year old male with chronic low back pain. History of prostate cancer. Pacemaker.   EXAM: CT LUMBAR SPINE WITHOUT CONTRAST   TECHNIQUE: Multidetector CT imaging of the lumbar spine was performed without intravenous contrast administration. Multiplanar CT image reconstructions were also generated.   COMPARISON:  Lumbar radiographs 04/08/2020.   FINDINGS: Segmentation: Normal.   Alignment: Stable straightening of lumbar lordosis as seen on radiographs in June. There is mild degenerative retrolisthesis of L5 on S1. Mild levoconvex lumbar scoliosis.   Vertebrae: No acute osseous abnormality identified. Intact visible sacrum and SI joints, with moderate to severe degenerative sacral osteophytosis which is greater on the left (series 4, image 119).   Paraspinal and other soft tissues: Several small 4-5 mm gallstones are evident (series 3, image 6). Punctate right lower pole nephrolithiasis. Mild Aortoiliac calcified atherosclerosis. Negative lumbar paraspinal soft tissues.   Disc levels:   T11-T12: Preserved disc space height with mild mostly anterior and lateral endplate spurring. No stenosis.   T12-L1: Disc space loss with vacuum disc. Mild circumferential disc osteophyte complex. Borderline to mild left T12 foraminal stenosis.   L1-L2: Disc space loss with mild vacuum disc. Circumferential disc osteophyte complex. No convincing spinal stenosis. Borderline to mild L1 foraminal stenosis mostly on the right.   L2-L3: Disc space loss with  circumferential disc bulge and endplate spurring. Up to mild spinal stenosis. Mild to moderate L2 foraminal stenosis greater on the left.   L3-L4: Mild vacuum disc. Circumferential disc bulge and endplate spurring eccentric to the right. Mild posterior element hypertrophy. No significant spinal stenosis. Up to mild left and moderate right L3 foraminal stenosis.   L4-L5: Circumferential disc bulge eccentric to the left. Endplate spurring. Moderate posterior element hypertrophy, including vacuum facet on the right. No significant spinal stenosis, although leftward disc probably results in mild left lateral recess stenosis (left L5 nerve level series 3, image 87). Moderate left and moderate to severe right L4 foraminal stenosis.   L5-S1: Disc space loss with vacuum disc. Circumferential disc osteophyte complex. Mild facet hypertrophy greater on the left. Mild epidural lipomatosis, no spinal or lateral recess stenosis. Moderate bilateral L5 foraminal stenosis.   IMPRESSION: 1. No acute or suspicious osseous abnormality in the lumbar spine.   2. Advanced disc and endplate degeneration, especially T12-L1, L1-L2, L5-S1 where there is vacuum disc. But no significant spinal stenosis at those levels.   3. 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 Mild left lateral recess stenosis suspected at L4-L5 (left L5 nerve level). Moderate neural foraminal stenosis at the left L2, right  L3, bilateral L4 (severe on the right) and bilateral L5 nerve levels.   4. Cholelithiasis and right nephrolithiasis. Aortic Atherosclerosis (ICD10-I70.0).    PATIENT SURVEYS:  FOTO    SCREENING FOR RED FLAGS: Bowel or bladder incontinence: No Spinal tumors: No Cauda equina syndrome: No Compression fracture: No Abdominal aneurysm: No  COGNITION: Overall cognitive status: Within functional limits for tasks assessed     SENSATION: WFL  MUSCLE LENGTH: POSTURE: flexed posture    PALPATION: Spasming along right PSIS   LUMBAR ROM:   AROM eval  Flexion WFL going down pain upon extension coming up   Extension Limited 50%   Right lateral flexion painful  Left lateral flexion Stretch on right side   Right rotation WNL  Left rotation WNL patient reports rotation limited when he does not take gabapentin    (Blank rows = not tested)  LOWER EXTREMITY ROM:     Passive  Right eval Left eval  Hip flexion    Hip extension    Hip abduction    Hip adduction    Hip internal rotation    Hip external rotation    Knee flexion    Knee extension    Ankle dorsiflexion    Ankle plantarflexion    Ankle inversion    Ankle eversion     (Blank rows = not tested)  LOWER EXTREMITY MMT:    MMT Right eval Left eval  Hip flexion 35.3 34.9  Hip extension    Hip abduction 39.5 44.6  Hip adduction    Hip internal rotation    Hip external rotation    Knee flexion    Knee extension 41.3 44.8  Ankle dorsiflexion    Ankle plantarflexion    Ankle inversion    Ankle eversion     (Blank rows = not tested)   GAIT: Flexed trunk, decreased bilateral hip flexion TODAY'S TREATMENT:                                                                                                                              DATE:   Access Code: [email or web address] URL: [email or web address] Date: 03/29/2023 Prepared by: Lorayne Bender  Exercises - Supine Piriformis Stretch with Foot on Ground  - 1 x daily - 7 x weekly - 3 sets - 3 reps - 20 hold - Seated Bilateral Shoulder Flexion Towel Slide at Table Top  - 1 x daily - 7 x weekly - 3 sets - 3 reps - 10 sec  hold - Standing Glute Med Mobilization with Small Ball on Wall  - 1 x daily - 7 x weekly - 3 sets - 1-76min  hold - Theracane Over Shoulder  - 1 x daily - 7 x weekly - 3 sets - 10 reps - Supine Bridge  - 1 x daily - 7 x weekly - 3 sets - 10 reps - Small Range Straight Leg Raise  - 1 x daily - 7 x weekly -  3 sets - 10  reps   PATIENT EDUCATION:  Education details: HEP, symptom management, anatomy of condition Person educated: Patient Education method: Explanation, Demonstration, Tactile cues, Verbal cues, and Handouts Education comprehension: verbalized understanding, returned demonstration, verbal cues required, tactile cues required, and needs further education  HOME EXERCISE PROGRAM: Access Code: [email or web address] URL: [email or web address] Date: 03/29/2023 Prepared by: Lorayne Bender  Exercises - Supine Piriformis Stretch with Foot on Ground  - 1 x daily - 7 x weekly - 3 sets - 3 reps - 20 hold - Seated Bilateral Shoulder Flexion Towel Slide at Table Top  - 1 x daily - 7 x weekly - 3 sets - 3 reps - 10 sec  hold - Standing Glute Med Mobilization with Small Ball on Wall  - 1 x daily - 7 x weekly - 3 sets - 1-52min  hold - Theracane Over Shoulder  - 1 x daily - 7 x weekly - 3 sets - 10 reps - Supine Bridge  - 1 x daily - 7 x weekly - 3 sets - 10 reps - Small Range Straight Leg Raise  - 1 x daily - 7 x weekly - 3 sets - 10 reps   ASSESSMENT:  CLINICAL IMPRESSION: Patient is a 77 year old male who presents to physical therapy with right-sided low back pain.Marland Kitchen  His low back pain is chronic but is recently begun to limit his ability to ambulate.  He has increased pain with lumbar extension and right lumbar sidebending.  He has a mild decrease in strength on the right side compared to left.  He has deficits in hip abduction strength as well as knee extension strength.  He has  spasming in the right upper gluteal and right lower lumbar spine.  He would benefit from skilled therapy to increase core strength and stability increase general functional mobility and develop an exercise program to promote decreased low back pain.  OBJECTIVE IMPAIRMENTS: Abnormal gait, decreased activity tolerance, difficulty walking, decreased ROM, decreased strength, postural dysfunction, and pain.   ACTIVITY LIMITATIONS:  carrying, bending, standing, and locomotion level  PARTICIPATION LIMITATIONS: meal prep, cleaning, laundry, shopping, community activity, and yard work  PERSONAL FACTORS: 1-2 comorbidities: pacemaker, anxiety/depression   are also affecting patient's functional outcome.   REHAB POTENTIAL: Good  CLINICAL DECISION MAKING: Evolving/moderate complexity decreasing ability to stand and ambualte   EVALUATION COMPLEXITY: Moderate   GOALS: Goals reviewed with patient? Yes  SHORT TERM GOALS: Target date: 04/26/2023    Patient will stand without increased trunk flexion Baseline: Goal status: INITIAL  2.  Patient will demonstrate right lumbar sidebending without increased pain Baseline:  Goal status: INITIAL  3.  Patient will increase gross bilateral lower extremity strength by 5 pounds Baseline:  Goal status: INITIAL   LONG TERM GOALS: Target date: 05/24/2023    Patient will stand for greater than 15 minutes without increased low back pain Baseline:  Goal status: INITIAL  2.  Patient will walk community distances without increased low back pain Baseline:  Goal status: INITIAL  3.  Patient will have complete HEP to promote core stability and decrease back pain with time Baseline:  Goal status: INITIAL  PLAN:  PT FREQUENCY: 2x/week  PT DURATION: 8 weeks  PLANNED INTERVENTIONS: Therapeutic exercises, Therapeutic activity, Neuromuscular re-education, Balance training, Gait training, Patient/Family education, Self Care, Joint mobilization, Aquatic Therapy, Dry Needling, Cryotherapy, Moist heat, Taping, and Manual therapy.  PLAN FOR NEXT SESSION: Consider manual therapy to trigger point right-sided lower back, consider LAD for decompression  of lower back.  Expand supine exercises.  Consider supine march.  Add standing series including rows and shoulder extension with TA breathing.  Progress core stability as tolerated.  Patient is interested in aquatic therapy at this time he  becomes interested schedule.   Dessie Coma, PT 03/29/2023, 2:49 PM

## 2023-04-07 ENCOUNTER — Ambulatory Visit (HOSPITAL_BASED_OUTPATIENT_CLINIC_OR_DEPARTMENT_OTHER): Payer: Medicare Other

## 2023-04-07 ENCOUNTER — Encounter (HOSPITAL_BASED_OUTPATIENT_CLINIC_OR_DEPARTMENT_OTHER): Payer: Self-pay

## 2023-04-07 DIAGNOSIS — M5459 Other low back pain: Secondary | ICD-10-CM | POA: Diagnosis not present

## 2023-04-07 DIAGNOSIS — R2689 Other abnormalities of gait and mobility: Secondary | ICD-10-CM | POA: Diagnosis not present

## 2023-04-07 NOTE — Therapy (Signed)
OUTPATIENT PHYSICAL THERAPY THORACOLUMBAR TREATMENT   Patient Name: Manuel Escobar MRN: [id number] DOB:[birth year], 77 y.o., male Today's Date: 04/07/2023  END OF SESSION:  PT End of Session - 04/07/23 1135     Visit Number 2    Number of Visits 8    Date for PT Re-Evaluation 05/24/23    PT Start Time 1103    PT Stop Time 1145    PT Time Calculation (min) 42 min    Activity Tolerance Patient tolerated treatment well    Behavior During Therapy [facility] for tasks assessed/performed              Past Medical History:  Diagnosis Date   Alcoholism (HCC)    Anxiety    Aortic stenosis 08/14/2017   Cardiac syncope 04/28/2019   Depression    Encounter for care of pacemaker 04/28/2019   History of pacemaker    Hypertension    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HCC)    Sinus node dysfunction (HCC) 04/28/2019   Past Surgical History:  Procedure Laterality Date   APPENDECTOMY     CORONARY PRESSURE/FFR STUDY N/A 08/30/2017   Procedure: INTRAVASCULAR PRESSURE WIRE/FFR STUDY;  Surgeon: Elder Negus, MD;  Location: [facility];  Service: Cardiovascular;  Laterality: N/A;   PACEMAKER INSERTION  04/13/2014   PROSTATECTOMY     RIGHT/LEFT HEART CATH AND CORONARY ANGIOGRAPHY N/A 08/30/2017   Procedure: RIGHT/LEFT HEART CATH AND CORONARY ANGIOGRAPHY;  Surgeon: Elder Negus, MD;  Location: [facility] CV [facility];  Service: Cardiovascular;  Laterality: N/A;   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   Scalp   TEE WITHOUT CARDIOVERSION N/A 08/18/2017   Procedure: TRANSESOPHAGEAL ECHOCARDIOGRAM (TEE);  Surgeon: Yates Decamp, MD;  Location: [facility] ENDOSCOPY;  Service: Cardiovascular;  Laterality: N/A;   Patient Active Problem List   Diagnosis Date Noted   Pure hypercholesterolemia 03/01/2022   Recurrent prostate adenocarcinoma (HCC) 03/01/2022   Obesity 03/01/2022   Mixed hyperlipidemia 03/01/2022    History of malignant neoplasm of prostate 03/01/2022   Personal history of colonic polyps 03/01/2022   History of cardiac arrest 03/01/2022   History of alcohol abuse 03/01/2022   H/O alcohol dependence (HCC) 03/01/2022   Fibromyalgia 03/01/2022   Essential hypertension 03/01/2022   Depression 03/01/2022   Carotid atherosclerosis 03/01/2022   Valvular heart disease 03/01/2022   Anxiety 03/01/2022   Chronic intermittent hypoxia with obstructive sleep apnea 09/14/2020   Chronic fatigue syndrome with fibromyalgia 08/28/2020   Witnessed episode of apnea 08/28/2020   Non-restorative sleep 08/28/2020   Severe obstructive sleep apnea-hypopnea syndrome 08/28/2020   Cardiac syncope 04/28/2019   Sinus node dysfunction (HCC) 04/28/2019   Encounter for care of pacemaker 04/28/2019   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 Henrine Screws  REFERRING PROVIDER: Dr. Venita Lick  REFERRING DIAG: Diagnosis  M54.51 (ICD-10-CM) - Vertebrogenic low back pain     Rationale for Evaluation and Treatment: Rehabilitation  THERAPY DIAG:  Other low back pain  Other abnormalities of gait and mobility  ONSET DATE: Long history of low back pain  SUBJECTIVE:  SUBJECTIVE STATEMENT: Pt reports he did 45 minutes on recumbent bike at home. Reports stiffness and tightness following this. Mild pain in R side of low back.  PERTINENT HISTORY:  Anxiety, aortic stenosis, depression, pacemaker, recurrent prostate cancer.  PAIN:  Are you having pain? Yes: NPRS scale: 5-6 when standing/10 Pain location: Right-sided low back Pain description: Aching Aggravating factors: Standing and walking Relieving factors:  Sitting  PRECAUTIONS: ICD/Pacemaker  WEIGHT BEARING RESTRICTIONS: No  FALLS:  Has patient fallen in last 6 months? No  LIVING ENVIRONMENT:  OCCUPATION:  Retired Psychologist, sport and exercise  Hobbies: Tries to stay active  PLOF: Independent  PATIENT GOALS: To develop plan to improve pain  NEXT MD VISIT:  Nothing scheduled  OBJECTIVE:   DIAGNOSTIC FINDINGS:  Lumbar CT 2021Narrative & Impression  CLINICAL DATA:  77 year old male with chronic low back pain. History of prostate cancer. Pacemaker.   EXAM: CT LUMBAR SPINE WITHOUT CONTRAST   TECHNIQUE: Multidetector CT imaging of the lumbar spine was performed without intravenous contrast administration. Multiplanar CT image reconstructions were also generated.   COMPARISON:  Lumbar radiographs 04/08/2020.   FINDINGS: Segmentation: Normal.   Alignment: Stable straightening of lumbar lordosis as seen on radiographs in June. There is mild degenerative retrolisthesis of L5 on S1. Mild levoconvex lumbar scoliosis.   Vertebrae: No acute osseous abnormality identified. Intact visible sacrum and SI joints, with moderate to severe degenerative sacral osteophytosis which is greater on the left (series 4, image 119).   Paraspinal and other soft tissues: Several small 4-5 mm gallstones are evident (series 3, image 6). Punctate right lower pole nephrolithiasis. Mild Aortoiliac calcified atherosclerosis. Negative lumbar paraspinal soft tissues.   Disc levels:   T11-T12: Preserved disc space height with mild mostly anterior and lateral endplate spurring. No stenosis.   T12-L1: Disc space loss with vacuum disc. Mild circumferential disc osteophyte complex. Borderline to mild left T12 foraminal stenosis.   L1-L2: Disc space loss with mild vacuum disc. Circumferential disc osteophyte complex. No convincing spinal stenosis. Borderline to mild L1 foraminal stenosis mostly on the right.   L2-L3: Disc space loss with  circumferential disc bulge and endplate spurring. Up to mild spinal stenosis. Mild to moderate L2 foraminal stenosis greater on the left.   L3-L4: Mild vacuum disc. Circumferential disc bulge and endplate spurring eccentric to the right. Mild posterior element hypertrophy. No significant spinal stenosis. Up to mild left and moderate right L3 foraminal stenosis.   L4-L5: Circumferential disc bulge eccentric to the left. Endplate spurring. Moderate posterior element hypertrophy, including vacuum facet on the right. No significant spinal stenosis, although leftward disc probably results in mild left lateral recess stenosis (left L5 nerve level series 3, image 87). Moderate left and moderate to severe right L4 foraminal stenosis.   L5-S1: Disc space loss with vacuum disc. Circumferential disc osteophyte complex. Mild facet hypertrophy greater on the left. Mild epidural lipomatosis, no spinal or lateral recess stenosis. Moderate bilateral L5 foraminal stenosis.   IMPRESSION: 1. No acute or suspicious osseous abnormality in the lumbar spine.   2. Advanced disc and endplate degeneration, especially T12-L1, L1-L2, L5-S1 where there is vacuum disc. But no significant spinal stenosis at those levels.   3. 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 Mild left lateral recess stenosis suspected at L4-L5 (left L5 nerve level). Moderate neural foraminal stenosis at the left L2, right L3, bilateral L4 (severe on the right) and bilateral L5 nerve levels.   4. Cholelithiasis and right nephrolithiasis. Aortic Atherosclerosis (ICD10-I70.0).  PATIENT SURVEYS:  FOTO    SCREENING FOR RED FLAGS: Bowel or bladder incontinence: No Spinal tumors: No Cauda equina syndrome: No Compression fracture: No Abdominal aneurysm: No  COGNITION: Overall cognitive status: Within functional limits for tasks assessed     SENSATION: WFL  MUSCLE LENGTH: POSTURE: flexed posture    PALPATION: Spasming along right PSIS   LUMBAR ROM:   AROM eval  Flexion WFL going down pain upon extension coming up   Extension Limited 50%   Right lateral flexion painful  Left lateral flexion Stretch on right side   Right rotation WNL  Left rotation WNL patient reports rotation limited when he does not take gabapentin    (Blank rows = not tested)  LOWER EXTREMITY ROM:     Passive  Right eval Left eval  Hip flexion    Hip extension    Hip abduction    Hip adduction    Hip internal rotation    Hip external rotation    Knee flexion    Knee extension    Ankle dorsiflexion    Ankle plantarflexion    Ankle inversion    Ankle eversion     (Blank rows = not tested)  LOWER EXTREMITY MMT:    MMT Right eval Left eval  Hip flexion 35.3 34.9  Hip extension    Hip abduction 39.5 44.6  Hip adduction    Hip internal rotation    Hip external rotation    Knee flexion    Knee extension 41.3 44.8  Ankle dorsiflexion    Ankle plantarflexion    Ankle inversion    Ankle eversion     (Blank rows = not tested)   GAIT: Flexed trunk, decreased bilateral hip flexion TODAY'S TREATMENT:                                                                                                                              DATE:  6/27  -Manual HSS Piriformis stretch STM and TPR to lateral R glutes and QL in sidelying Child's pose with/without side bend 30sec x3ea (showed  Seated QL stretch 30sec x2 Hooklying PPT- 5" hold x10 Sidelying clam 2x10L HEP update and review    Exercises - Supine Piriformis Stretch with Foot on Ground  - 1 x daily - 7 x weekly - 3 sets - 3 reps - 20 hold - Seated Bilateral Shoulder Flexion Towel Slide at Table Top  - 1 x daily - 7 x weekly - 3 sets - 3 reps - 10 sec  hold - Standing Glute Med Mobilization with Small Ball on Wall  - 1 x daily - 7 x weekly - 3 sets - 1-84min  hold - Theracane Over Shoulder  - 1 x daily - 7 x weekly - 3 sets - 10 reps -  Supine Bridge  - 1 x daily - 7 x weekly - 3 sets - 10 reps - Small Range Straight Leg Raise  -  1 x daily - 7 x weekly - 3 sets - 10 reps   PATIENT EDUCATION:  Education details: HEP, symptom management, anatomy of condition Person educated: Patient Education method: Explanation, Demonstration, Tactile cues, Verbal cues, and Handouts Education comprehension: verbalized understanding, returned demonstration, verbal cues required, tactile cues required, and needs further education  HOME EXERCISE PROGRAM: Access Code: [email or web address] URL: [email or web address] Date: 03/29/2023 Prepared by: Lorayne Bender  Exercises - Supine Piriformis Stretch with Foot on Ground  - 1 x daily - 7 x weekly - 3 sets - 3 reps - 20 hold - Seated Bilateral Shoulder Flexion Towel Slide at Table Top  - 1 x daily - 7 x weekly - 3 sets - 3 reps - 10 sec  hold - Standing Glute Med Mobilization with Small Ball on Wall  - 1 x daily - 7 x weekly - 3 sets - 1-40min  hold - Theracane Over Shoulder  - 1 x daily - 7 x weekly - 3 sets - 10 reps - Supine Bridge  - 1 x daily - 7 x weekly - 3 sets - 10 reps - Small Range Straight Leg Raise  - 1 x daily - 7 x weekly - 3 sets - 10 reps   ASSESSMENT:  CLINICAL IMPRESSION: Pt with palpable trigger points throughout lateral aspect of R hip and R QL. Spent time on manual release to this area with subjective improvement reported. Instructed pt in stretching for lumbar paraspinals and QL, which he reported benefit from. Pt reported significant improvement in lumbar tightness by end of session. Provided updated HEP for pt to work on stretching and sidelying clams at home. Educated about DOMS.    OBJECTIVE IMPAIRMENTS: Abnormal gait, decreased activity tolerance, difficulty walking, decreased ROM, decreased strength, postural dysfunction, and pain.   ACTIVITY LIMITATIONS: carrying, bending, standing, and locomotion level  PARTICIPATION LIMITATIONS: meal prep, cleaning, laundry,  shopping, community activity, and yard work  PERSONAL FACTORS: 1-2 comorbidities: pacemaker, anxiety/depression   are also affecting patient's functional outcome.   REHAB POTENTIAL: Good  CLINICAL DECISION MAKING: Evolving/moderate complexity decreasing ability to stand and ambualte   EVALUATION COMPLEXITY: Moderate   GOALS: Goals reviewed with patient? Yes  SHORT TERM GOALS: Target date: 04/26/2023    Patient will stand without increased trunk flexion Baseline: Goal status: INITIAL  2.  Patient will demonstrate right lumbar sidebending without increased pain Baseline:  Goal status: INITIAL  3.  Patient will increase gross bilateral lower extremity strength by 5 pounds Baseline:  Goal status: INITIAL   LONG TERM GOALS: Target date: 05/24/2023    Patient will stand for greater than 15 minutes without increased low back pain Baseline:  Goal status: INITIAL  2.  Patient will walk community distances without increased low back pain Baseline:  Goal status: INITIAL  3.  Patient will have complete HEP to promote core stability and decrease back pain with time Baseline:  Goal status: INITIAL  PLAN:  PT FREQUENCY: 2x/week  PT DURATION: 8 weeks  PLANNED INTERVENTIONS: Therapeutic exercises, Therapeutic activity, Neuromuscular re-education, Balance training, Gait training, Patient/Family education, Self Care, Joint mobilization, Aquatic Therapy, Dry Needling, Cryotherapy, Moist heat, Taping, and Manual therapy.  PLAN FOR NEXT SESSION: Consider manual therapy to trigger point right-sided lower back, consider LAD for decompression of lower back.  Expand supine exercises.  Consider supine march.  Add standing series including rows and shoulder extension with TA breathing.  Progress core stability as tolerated.  Patient is interested in aquatic therapy at this time  he becomes interested schedule.   Donnel Saxon Aariv Medlock, PTA 04/07/2023, 12:25 PM

## 2023-04-12 ENCOUNTER — Ambulatory Visit (HOSPITAL_BASED_OUTPATIENT_CLINIC_OR_DEPARTMENT_OTHER): Payer: Medicare Other

## 2023-04-21 ENCOUNTER — Encounter (HOSPITAL_BASED_OUTPATIENT_CLINIC_OR_DEPARTMENT_OTHER): Payer: Self-pay

## 2023-04-21 ENCOUNTER — Ambulatory Visit (HOSPITAL_BASED_OUTPATIENT_CLINIC_OR_DEPARTMENT_OTHER): Payer: Medicare Other | Attending: Orthopedic Surgery

## 2023-04-21 DIAGNOSIS — M5459 Other low back pain: Secondary | ICD-10-CM | POA: Diagnosis not present

## 2023-04-21 DIAGNOSIS — R2689 Other abnormalities of gait and mobility: Secondary | ICD-10-CM | POA: Insufficient documentation

## 2023-04-21 NOTE — Therapy (Signed)
OUTPATIENT PHYSICAL THERAPY THORACOLUMBAR TREATMENT   Patient Name: Manuel Escobar MRN: [id number] DOB:[birth year], 77 y.o., male Today's Date: 04/21/2023  END OF SESSION:  PT End of Session - 04/21/23 1103     Visit Number 3    Number of Visits 8    Date for PT Re-Evaluation 05/24/23    PT Start Time 1103    PT Stop Time 1145    PT Time Calculation (min) 42 min    Activity Tolerance Patient tolerated treatment well    Behavior During Therapy [facility] for tasks assessed/performed              Past Medical History:  Diagnosis Date   Alcoholism (HCC)    Anxiety    Aortic stenosis 08/14/2017   Cardiac syncope 04/28/2019   Depression    Encounter for care of pacemaker 04/28/2019   History of pacemaker    Hypertension    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HCC)    Sinus node dysfunction (HCC) 04/28/2019   Past Surgical History:  Procedure Laterality Date   APPENDECTOMY     CORONARY PRESSURE/FFR STUDY N/A 08/30/2017   Procedure: INTRAVASCULAR PRESSURE WIRE/FFR STUDY;  Surgeon: Elder Negus, MD;  Location: [facility] INVASIVE CV [facility];  Service: Cardiovascular;  Laterality: N/A;   PACEMAKER INSERTION  04/13/2014   PROSTATECTOMY     RIGHT/LEFT HEART CATH AND CORONARY ANGIOGRAPHY N/A 08/30/2017   Procedure: RIGHT/LEFT HEART CATH AND CORONARY ANGIOGRAPHY;  Surgeon: Elder Negus, MD;  Location: [facility] INVASIVE CV LAB;  Service: Cardiovascular;  Laterality: N/A;   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   Scalp   TEE WITHOUT CARDIOVERSION N/A 08/18/2017   Procedure: TRANSESOPHAGEAL ECHOCARDIOGRAM (TEE);  Surgeon: Yates Decamp, MD;  Location: [facility] ENDOSCOPY;  Service: Cardiovascular;  Laterality: N/A;   Patient Active Problem List   Diagnosis Date Noted   Pure hypercholesterolemia 03/01/2022   Recurrent prostate adenocarcinoma (HCC) 03/01/2022   Obesity 03/01/2022   Mixed hyperlipidemia 03/01/2022    History of malignant neoplasm of prostate 03/01/2022   Personal history of colonic polyps 03/01/2022   History of cardiac arrest 03/01/2022   History of alcohol abuse 03/01/2022   H/O alcohol dependence (HCC) 03/01/2022   Fibromyalgia 03/01/2022   Essential hypertension 03/01/2022   Depression 03/01/2022   Carotid atherosclerosis 03/01/2022   Valvular heart disease 03/01/2022   Anxiety 03/01/2022   Chronic intermittent hypoxia with obstructive sleep apnea 09/14/2020   Chronic fatigue syndrome with fibromyalgia 08/28/2020   Witnessed episode of apnea 08/28/2020   Non-restorative sleep 08/28/2020   Severe obstructive sleep apnea-hypopnea syndrome 08/28/2020   Cardiac syncope 04/28/2019   Sinus node dysfunction (HCC) 04/28/2019   Encounter for care of pacemaker 04/28/2019   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 Henrine Screws  REFERRING PROVIDER: Dr. Venita Lick  REFERRING DIAG: Diagnosis  M54.51 (ICD-10-CM) - Vertebrogenic low back pain     Rationale for Evaluation and Treatment: Rehabilitation  THERAPY DIAG:  Other low back pain  Other abnormalities of gait and mobility  ONSET DATE: Long history of low back pain  SUBJECTIVE:  SUBJECTIVE STATEMENT: Pt reports mild R sided back tightness but no pain. Compliant with HEP.   PERTINENT HISTORY:  Anxiety, aortic stenosis, depression, pacemaker, recurrent prostate cancer.  PAIN:  Are you having pain? Yes: NPRS scale: 5-6 when standing/10 Pain location: Right-sided low back Pain description: Aching Aggravating factors: Standing and walking Relieving factors: Sitting  PRECAUTIONS: ICD/Pacemaker  WEIGHT BEARING RESTRICTIONS: No  FALLS:   Has patient fallen in last 6 months? No  LIVING ENVIRONMENT:  OCCUPATION:  Retired Psychologist, sport and exercise  Hobbies: Tries to stay active  PLOF: Independent  PATIENT GOALS: To develop plan to improve pain  NEXT MD VISIT:  Nothing scheduled  OBJECTIVE:   DIAGNOSTIC FINDINGS:  Lumbar CT 2021Narrative & Impression  CLINICAL DATA:  77 year old male with chronic low back pain. History of prostate cancer. Pacemaker.   EXAM: CT LUMBAR SPINE WITHOUT CONTRAST   TECHNIQUE: Multidetector CT imaging of the lumbar spine was performed without intravenous contrast administration. Multiplanar CT image reconstructions were also generated.   COMPARISON:  Lumbar radiographs 04/08/2020.   FINDINGS: Segmentation: Normal.   Alignment: Stable straightening of lumbar lordosis as seen on radiographs in June. There is mild degenerative retrolisthesis of L5 on S1. Mild levoconvex lumbar scoliosis.   Vertebrae: No acute osseous abnormality identified. Intact visible sacrum and SI joints, with moderate to severe degenerative sacral osteophytosis which is greater on the left (series 4, image 119).   Paraspinal and other soft tissues: Several small 4-5 mm gallstones are evident (series 3, image 6). Punctate right lower pole nephrolithiasis. Mild Aortoiliac calcified atherosclerosis. Negative lumbar paraspinal soft tissues.   Disc levels:   T11-T12: Preserved disc space height with mild mostly anterior and lateral endplate spurring. No stenosis.   T12-L1: Disc space loss with vacuum disc. Mild circumferential disc osteophyte complex. Borderline to mild left T12 foraminal stenosis.   L1-L2: Disc space loss with mild vacuum disc. Circumferential disc osteophyte complex. No convincing spinal stenosis. Borderline to mild L1 foraminal stenosis mostly on the right.   L2-L3: Disc space loss with circumferential disc bulge and endplate spurring. Up to mild spinal stenosis. Mild to moderate  L2 foraminal stenosis greater on the left.   L3-L4: Mild vacuum disc. Circumferential disc bulge and endplate spurring eccentric to the right. Mild posterior element hypertrophy. No significant spinal stenosis. Up to mild left and moderate right L3 foraminal stenosis.   L4-L5: Circumferential disc bulge eccentric to the left. Endplate spurring. Moderate posterior element hypertrophy, including vacuum facet on the right. No significant spinal stenosis, although leftward disc probably results in mild left lateral recess stenosis (left L5 nerve level series 3, image 87). Moderate left and moderate to severe right L4 foraminal stenosis.   L5-S1: Disc space loss with vacuum disc. Circumferential disc osteophyte complex. Mild facet hypertrophy greater on the left. Mild epidural lipomatosis, no spinal or lateral recess stenosis. Moderate bilateral L5 foraminal stenosis.   IMPRESSION: 1. No acute or suspicious osseous abnormality in the lumbar spine.   2. Advanced disc and endplate degeneration, especially T12-L1, L1-L2, L5-S1 where there is vacuum disc. But no significant spinal stenosis at those levels.   3. 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 Mild left lateral recess stenosis suspected at L4-L5 (left L5 nerve level). Moderate neural foraminal stenosis at the left L2, right L3, bilateral L4 (severe on the right) and bilateral L5 nerve levels.   4. Cholelithiasis and right nephrolithiasis. Aortic Atherosclerosis (ICD10-I70.0).    PATIENT SURVEYS:  FOTO    SCREENING FOR RED FLAGS:  Bowel or bladder incontinence: No Spinal tumors: No Cauda equina syndrome: No Compression fracture: No Abdominal aneurysm: No  COGNITION: Overall cognitive status: Within functional limits for tasks assessed     SENSATION: WFL  MUSCLE LENGTH: POSTURE: flexed posture   PALPATION: Spasming along right PSIS   LUMBAR ROM:   AROM eval  Flexion WFL going down pain upon  extension coming up   Extension Limited 50%   Right lateral flexion painful  Left lateral flexion Stretch on right side   Right rotation WNL  Left rotation WNL patient reports rotation limited when he does not take gabapentin    (Blank rows = not tested)  LOWER EXTREMITY ROM:     Passive  Right eval Left eval  Hip flexion    Hip extension    Hip abduction    Hip adduction    Hip internal rotation    Hip external rotation    Knee flexion    Knee extension    Ankle dorsiflexion    Ankle plantarflexion    Ankle inversion    Ankle eversion     (Blank rows = not tested)  LOWER EXTREMITY MMT:    MMT Right eval Left eval  Hip flexion 35.3 34.9  Hip extension    Hip abduction 39.5 44.6  Hip adduction    Hip internal rotation    Hip external rotation    Knee flexion    Knee extension 41.3 44.8  Ankle dorsiflexion    Ankle plantarflexion    Ankle inversion    Ankle eversion     (Blank rows = not tested)   GAIT: Flexed trunk, decreased bilateral hip flexion TODAY'S TREATMENT:                                                                                                                              DATE:   7/11  -Manual HSS Piriformis stretch STM and TPR to lateral R glutes and QL in sidelying  Hooklying march with TrA x20 Supine SLR x10ea Sidelying clam 2x10L Sidelying hip abduction x10 R Prone hip extension x10ea (trialled with knee flexed, but had cramps) Sit to stands x10 HEP update and review     6/27  -Manual HSS Piriformis stretch STM and TPR to lateral R glutes and QL in sidelying Child's pose with/without side bend 30sec x3ea (showed  Seated QL stretch 30sec x2 Hooklying PPT- 5" hold x10 Sidelying clam 2x10L HEP update and review    Exercises - Supine Piriformis Stretch with Foot on Ground  - 1 x daily - 7 x weekly - 3 sets - 3 reps - 20 hold - Seated Bilateral Shoulder Flexion Towel Slide at Table Top  - 1 x daily - 7 x weekly - 3  sets - 3 reps - 10 sec  hold - Standing Glute Med Mobilization with Small Ball on Wall  - 1 x daily - 7 x weekly - 3 sets -  1-1min  hold - Theracane Over Shoulder  - 1 x daily - 7 x weekly - 3 sets - 10 reps - Supine Bridge  - 1 x daily - 7 x weekly - 3 sets - 10 reps - Small Range Straight Leg Raise  - 1 x daily - 7 x weekly - 3 sets - 10 reps   PATIENT EDUCATION:  Education details: HEP, symptom management, anatomy of condition Person educated: Patient Education method: Explanation, Demonstration, Tactile cues, Verbal cues, and Handouts Education comprehension: verbalized understanding, returned demonstration, verbal cues required, tactile cues required, and needs further education  HOME EXERCISE PROGRAM: Access Code: [email or web address] URL: [email or web address] Date: 03/29/2023 Prepared by: Lorayne Bender  Exercises - Supine Piriformis Stretch with Foot on Ground  - 1 x daily - 7 x weekly - 3 sets - 3 reps - 20 hold - Seated Bilateral Shoulder Flexion Towel Slide at Table Top  - 1 x daily - 7 x weekly - 3 sets - 3 reps - 10 sec  hold - Standing Glute Med Mobilization with Small Ball on Wall  - 1 x daily - 7 x weekly - 3 sets - 1-37min  hold - Theracane Over Shoulder  - 1 x daily - 7 x weekly - 3 sets - 10 reps - Supine Bridge  - 1 x daily - 7 x weekly - 3 sets - 10 reps - Small Range Straight Leg Raise  - 1 x daily - 7 x weekly - 3 sets - 10 reps   ASSESSMENT:  CLINICAL IMPRESSION:  Pt reported improved tightness following manual stretching. Reviewed proper SLR position, as pt had been overusing lumbar extensors. He felt no pain after correction. Challenged by sidelying abduction, but able to complete without compensation. No difficulty with sit to stands. Updated HEP to include hooklying marching and sidelying abduction.   Pt with palpable trigger points throughout lateral aspect of R hip and R QL. Spent time on manual release to this area with subjective improvement  reported. Instructed pt in stretching for lumbar paraspinals and QL, which he reported benefit from. Pt reported significant improvement in lumbar tightness by end of session. Provided updated HEP for pt to work on stretching and sidelying clams at home. Educated about DOMS.    OBJECTIVE IMPAIRMENTS: Abnormal gait, decreased activity tolerance, difficulty walking, decreased ROM, decreased strength, postural dysfunction, and pain.   ACTIVITY LIMITATIONS: carrying, bending, standing, and locomotion level  PARTICIPATION LIMITATIONS: meal prep, cleaning, laundry, shopping, community activity, and yard work  PERSONAL FACTORS: 1-2 comorbidities: pacemaker, anxiety/depression   are also affecting patient's functional outcome.   REHAB POTENTIAL: Good  CLINICAL DECISION MAKING: Evolving/moderate complexity decreasing ability to stand and ambualte   EVALUATION COMPLEXITY: Moderate   GOALS: Goals reviewed with patient? Yes  SHORT TERM GOALS: Target date: 04/26/2023    Patient will stand without increased trunk flexion Baseline: Goal status: INITIAL  2.  Patient will demonstrate right lumbar sidebending without increased pain Baseline:  Goal status: INITIAL  3.  Patient will increase gross bilateral lower extremity strength by 5 pounds Baseline:  Goal status: INITIAL   LONG TERM GOALS: Target date: 05/24/2023    Patient will stand for greater than 15 minutes without increased low back pain Baseline:  Goal status: INITIAL  2.  Patient will walk community distances without increased low back pain Baseline:  Goal status: INITIAL  3.  Patient will have complete HEP to promote core stability and decrease back pain with time Baseline:  Goal  status: INITIAL  PLAN:  PT FREQUENCY: 2x/week  PT DURATION: 8 weeks  PLANNED INTERVENTIONS: Therapeutic exercises, Therapeutic activity, Neuromuscular re-education, Balance training, Gait training, Patient/Family education, Self Care, Joint  mobilization, Aquatic Therapy, Dry Needling, Cryotherapy, Moist heat, Taping, and Manual therapy.  PLAN FOR NEXT SESSION: Consider manual therapy to trigger point right-sided lower back, consider LAD for decompression of lower back.  Expand supine exercises.  Consider supine march.  Add standing series including rows and shoulder extension with TA breathing.  Progress core stability as tolerated.  Patient is interested in aquatic therapy at this time he becomes interested schedule.   Donnel Saxon Lennell Shanks, PTA 04/21/2023, 11:52 AM

## 2023-04-28 ENCOUNTER — Encounter (HOSPITAL_BASED_OUTPATIENT_CLINIC_OR_DEPARTMENT_OTHER): Payer: Self-pay

## 2023-04-28 ENCOUNTER — Ambulatory Visit (HOSPITAL_BASED_OUTPATIENT_CLINIC_OR_DEPARTMENT_OTHER): Payer: Medicare Other

## 2023-04-28 DIAGNOSIS — R2689 Other abnormalities of gait and mobility: Secondary | ICD-10-CM

## 2023-04-28 DIAGNOSIS — M5459 Other low back pain: Secondary | ICD-10-CM

## 2023-04-28 NOTE — Therapy (Signed)
OUTPATIENT PHYSICAL THERAPY THORACOLUMBAR TREATMENT   Patient Name: Manuel Escobar MRN: [id number] DOB:March 19, 1946, 77 y.o., male Today's Date: 04/28/2023  END OF SESSION:  PT End of Session - 04/28/23 1102     Visit Number 4    Number of Visits 8    Date for PT Re-Evaluation 05/24/23    PT Start Time 1104    PT Stop Time 1148    PT Time Calculation (min) 44 min    Activity Tolerance Patient tolerated treatment well    Behavior During Therapy [facility] for tasks assessed/performed               Past Medical History:  Diagnosis Date   Alcoholism (HCC)    Anxiety    Aortic stenosis 08/14/2017   Cardiac syncope 04/28/2019   Depression    Encounter for care of pacemaker 04/28/2019   History of pacemaker    Hypertension    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HCC)    Sinus node dysfunction (HCC) 04/28/2019   Past Surgical History:  Procedure Laterality Date   APPENDECTOMY     CORONARY PRESSURE/FFR STUDY N/A 08/30/2017   Procedure: INTRAVASCULAR PRESSURE WIRE/FFR STUDY;  Surgeon: Elder Negus, MD;  Location: [facility];  Service: Cardiovascular;  Laterality: N/A;   PACEMAKER INSERTION  04/13/2014   PROSTATECTOMY     RIGHT/LEFT HEART CATH AND CORONARY ANGIOGRAPHY N/A 08/30/2017   Procedure: RIGHT/LEFT HEART CATH AND CORONARY ANGIOGRAPHY;  Surgeon: Elder Negus, MD;  Location: [facility];  Service: Cardiovascular;  Laterality: N/A;   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   Scalp   TEE WITHOUT CARDIOVERSION N/A 08/18/2017   Procedure: TRANSESOPHAGEAL ECHOCARDIOGRAM (TEE);  Surgeon: Yates Decamp, MD;  Location: [facility] ENDOSCOPY;  Service: Cardiovascular;  Laterality: N/A;   Patient Active Problem List   Diagnosis Date Noted   Pure hypercholesterolemia 03/01/2022   Recurrent prostate adenocarcinoma (HCC) 03/01/2022   Obesity 03/01/2022   Mixed hyperlipidemia 03/01/2022    History of malignant neoplasm of prostate 03/01/2022   Personal history of colonic polyps 03/01/2022   History of cardiac arrest 03/01/2022   History of alcohol abuse 03/01/2022   H/O alcohol dependence (HCC) 03/01/2022   Fibromyalgia 03/01/2022   Essential hypertension 03/01/2022   Depression 03/01/2022   Carotid atherosclerosis 03/01/2022   Valvular heart disease 03/01/2022   Anxiety 03/01/2022   Chronic intermittent hypoxia with obstructive sleep apnea 09/14/2020   Chronic fatigue syndrome with fibromyalgia 08/28/2020   Witnessed episode of apnea 08/28/2020   Non-restorative sleep 08/28/2020   Severe obstructive sleep apnea-hypopnea syndrome 08/28/2020   Cardiac syncope 04/28/2019   Sinus node dysfunction (HCC) 04/28/2019   Encounter for care of pacemaker 04/28/2019   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 Henrine Screws  REFERRING PROVIDER: Dr. Venita Lick  REFERRING DIAG: Diagnosis  M54.51 (ICD-10-CM) - Vertebrogenic low back pain     Rationale for Evaluation and Treatment: Rehabilitation  THERAPY DIAG:  Other low back pain  Other abnormalities of gait and mobility  ONSET DATE: Long history of low back pain  SUBJECTIVE:  SUBJECTIVE STATEMENT: Pt reports continued R sided low back tightness.   PERTINENT HISTORY:  Anxiety, aortic stenosis, depression, pacemaker, recurrent prostate cancer.  PAIN:  Are you having pain? Yes: NPRS scale: 5-6 when standing/10 Pain location: Right-sided low back Pain description: Aching Aggravating factors: Standing and walking Relieving factors: Sitting  PRECAUTIONS: ICD/Pacemaker  WEIGHT BEARING RESTRICTIONS: No  FALLS:  Has patient fallen in  last 6 months? No  LIVING ENVIRONMENT:  OCCUPATION:  Retired Psychologist, sport and exercise  Hobbies: Tries to stay active  PLOF: Independent  PATIENT GOALS: To develop plan to improve pain  NEXT MD VISIT:  Nothing scheduled  OBJECTIVE:   DIAGNOSTIC FINDINGS:  Lumbar CT 2021Narrative & Impression  CLINICAL DATA:  77 year old male with chronic low back pain. History of prostate cancer. Pacemaker.   EXAM: CT LUMBAR SPINE WITHOUT CONTRAST   TECHNIQUE: Multidetector CT imaging of the lumbar spine was performed without intravenous contrast administration. Multiplanar CT image reconstructions were also generated.   COMPARISON:  Lumbar radiographs 04/08/2020.   FINDINGS: Segmentation: Normal.   Alignment: Stable straightening of lumbar lordosis as seen on radiographs in June. There is mild degenerative retrolisthesis of L5 on S1. Mild levoconvex lumbar scoliosis.   Vertebrae: No acute osseous abnormality identified. Intact visible sacrum and SI joints, with moderate to severe degenerative sacral osteophytosis which is greater on the left (series 4, image 119).   Paraspinal and other soft tissues: Several small 4-5 mm gallstones are evident (series 3, image 6). Punctate right lower pole nephrolithiasis. Mild Aortoiliac calcified atherosclerosis. Negative lumbar paraspinal soft tissues.   Disc levels:   T11-T12: Preserved disc space height with mild mostly anterior and lateral endplate spurring. No stenosis.   T12-L1: Disc space loss with vacuum disc. Mild circumferential disc osteophyte complex. Borderline to mild left T12 foraminal stenosis.   L1-L2: Disc space loss with mild vacuum disc. Circumferential disc osteophyte complex. No convincing spinal stenosis. Borderline to mild L1 foraminal stenosis mostly on the right.   L2-L3: Disc space loss with circumferential disc bulge and endplate spurring. Up to mild spinal stenosis. Mild to moderate L2 foraminal stenosis  greater on the left.   L3-L4: Mild vacuum disc. Circumferential disc bulge and endplate spurring eccentric to the right. Mild posterior element hypertrophy. No significant spinal stenosis. Up to mild left and moderate right L3 foraminal stenosis.   L4-L5: Circumferential disc bulge eccentric to the left. Endplate spurring. Moderate posterior element hypertrophy, including vacuum facet on the right. No significant spinal stenosis, although leftward disc probably results in mild left lateral recess stenosis (left L5 nerve level series 3, image 87). Moderate left and moderate to severe right L4 foraminal stenosis.   L5-S1: Disc space loss with vacuum disc. Circumferential disc osteophyte complex. Mild facet hypertrophy greater on the left. Mild epidural lipomatosis, no spinal or lateral recess stenosis. Moderate bilateral L5 foraminal stenosis.   IMPRESSION: 1. No acute or suspicious osseous abnormality in the lumbar spine.   2. Advanced disc and endplate degeneration, especially T12-L1, L1-L2, L5-S1 where there is vacuum disc. But no significant spinal stenosis at those levels.   3. 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 Mild left lateral recess stenosis suspected at L4-L5 (left L5 nerve level). Moderate neural foraminal stenosis at the left L2, right L3, bilateral L4 (severe on the right) and bilateral L5 nerve levels.   4. Cholelithiasis and right nephrolithiasis. Aortic Atherosclerosis (ICD10-I70.0).    PATIENT SURVEYS:  FOTO    SCREENING FOR RED FLAGS: Bowel or bladder incontinence: No  Spinal tumors: No Cauda equina syndrome: No Compression fracture: No Abdominal aneurysm: No  COGNITION: Overall cognitive status: Within functional limits for tasks assessed     SENSATION: WFL  MUSCLE LENGTH: POSTURE: flexed posture   PALPATION: Spasming along right PSIS   LUMBAR ROM:   AROM eval  Flexion WFL going down pain upon extension coming up    Extension Limited 50%   Right lateral flexion painful  Left lateral flexion Stretch on right side   Right rotation WNL  Left rotation WNL patient reports rotation limited when he does not take gabapentin    (Blank rows = not tested)  LOWER EXTREMITY ROM:     Passive  Right eval Left eval  Hip flexion    Hip extension    Hip abduction    Hip adduction    Hip internal rotation    Hip external rotation    Knee flexion    Knee extension    Ankle dorsiflexion    Ankle plantarflexion    Ankle inversion    Ankle eversion     (Blank rows = not tested)  LOWER EXTREMITY MMT:    MMT Right eval Left eval  Hip flexion 35.3 34.9  Hip extension    Hip abduction 39.5 44.6  Hip adduction    Hip internal rotation    Hip external rotation    Knee flexion    Knee extension 41.3 44.8  Ankle dorsiflexion    Ankle plantarflexion    Ankle inversion    Ankle eversion     (Blank rows = not tested)   GAIT: Flexed trunk, decreased bilateral hip flexion TODAY'S TREATMENT:                                                                                                                              DATE:    7/18  -Manual HSS Piriformis stretch STM and TPR to lateral R glutes and QL in sidelying  Hooklying march with TrA 2# x20 Supine SLR 2x10ea with Tr Sidelying hip abduction 2x10 Bil Sit to stands 2x10 (easy) Standing hip abduction 2# 2x10 Standing hip extension 2# 2x10 Updated HEP   7/11  -Manual HSS Piriformis stretch STM and TPR to lateral R glutes and QL in sidelying  Hooklying march with TrA x20 Supine SLR x10ea Sidelying clam 2x10L Sidelying hip abduction x10 R Prone hip extension x10ea (trialled with knee flexed, but had cramps) Sit to stands x10 HEP update and review     6/27  -Manual HSS Piriformis stretch STM and TPR to lateral R glutes and QL in sidelying Child's pose with/without side bend 30sec x3ea (showed  Seated QL stretch 30sec  x2 Hooklying PPT- 5" hold x10 Sidelying clam 2x10L HEP update and review    Exercises - Supine Piriformis Stretch with Foot on Ground  - 1 x daily - 7 x weekly - 3 sets - 3 reps - 20 hold - Seated Bilateral Shoulder  Flexion Towel Slide at Table Top  - 1 x daily - 7 x weekly - 3 sets - 3 reps - 10 sec  hold - Standing Glute Med Mobilization with Small Ball on Wall  - 1 x daily - 7 x weekly - 3 sets - 1-29min  hold - Theracane Over Shoulder  - 1 x daily - 7 x weekly - 3 sets - 10 reps - Supine Bridge  - 1 x daily - 7 x weekly - 3 sets - 10 reps - Small Range Straight Leg Raise  - 1 x daily - 7 x weekly - 3 sets - 10 reps   PATIENT EDUCATION:  Education details: HEP, symptom management, anatomy of condition Person educated: Patient Education method: Explanation, Demonstration, Tactile cues, Verbal cues, and Handouts Education comprehension: verbalized understanding, returned demonstration, verbal cues required, tactile cues required, and needs further education  HOME EXERCISE PROGRAM: Access Code: [email or web address] URL: [email or web address] Date: 03/29/2023 Prepared by: Lorayne Bender  Exercises - Supine Piriformis Stretch with Foot on Ground  - 1 x daily - 7 x weekly - 3 sets - 3 reps - 20 hold - Seated Bilateral Shoulder Flexion Towel Slide at Table Top  - 1 x daily - 7 x weekly - 3 sets - 3 reps - 10 sec  hold - Standing Glute Med Mobilization with Small Ball on Wall  - 1 x daily - 7 x weekly - 3 sets - 1-70min  hold - Theracane Over Shoulder  - 1 x daily - 7 x weekly - 3 sets - 10 reps - Supine Bridge  - 1 x daily - 7 x weekly - 3 sets - 10 reps - Small Range Straight Leg Raise  - 1 x daily - 7 x weekly - 3 sets - 10 reps   ASSESSMENT:  CLINICAL IMPRESSION:  Pt has good self correction of abdominal engagement with SLR. He did report greater challenge with R hip abduction in sidelying compared to L. Cues required with sit to stands for increased hip flexion upon initiation  of movement. Reviewed goals today: pt has met 1/3 STG and 1/3 LTG.     OBJECTIVE IMPAIRMENTS: Abnormal gait, decreased activity tolerance, difficulty walking, decreased ROM, decreased strength, postural dysfunction, and pain.   ACTIVITY LIMITATIONS: carrying, bending, standing, and locomotion level  PARTICIPATION LIMITATIONS: meal prep, cleaning, laundry, shopping, community activity, and yard work  PERSONAL FACTORS: 1-2 comorbidities: pacemaker, anxiety/depression   are also affecting patient's functional outcome.   REHAB POTENTIAL: Good  CLINICAL DECISION MAKING: Evolving/moderate complexity decreasing ability to stand and ambualte   EVALUATION COMPLEXITY: Moderate   GOALS: Goals reviewed with patient? Yes  SHORT TERM GOALS: Target date: 04/26/2023    Patient will stand without increased trunk flexion Baseline: Goal status: IN PROGRESS  2.  Patient will demonstrate right lumbar sidebending without increased pain Baseline:  Goal status: MET (some R sided tightness with L side bending) 7/18  3.  Patient will increase gross bilateral lower extremity strength by 5 pounds Baseline:  Goal status: INITIAL   LONG TERM GOALS: Target date: 05/24/2023    Patient will stand for greater than 15 minutes without increased low back pain Baseline:  Goal status: MET 7/18  2.  Patient will walk community distances without increased low back pain Baseline:  Goal status: IN PROGRESS  3.  Patient will have complete HEP to promote core stability and decrease back pain with time Baseline:  Goal status: INITIAL  PLAN:  PT FREQUENCY: 2x/week  PT DURATION: 8 weeks  PLANNED INTERVENTIONS: Therapeutic exercises, Therapeutic activity, Neuromuscular re-education, Balance training, Gait training, Patient/Family education, Self Care, Joint mobilization, Aquatic Therapy, Dry Needling, Cryotherapy, Moist heat, Taping, and Manual therapy.  PLAN FOR NEXT SESSION: Progress core stability as  tolerated.  Patient is interested in aquatic therapy at this time he becomes interested schedule.   Donnel Saxon Harutyun Monteverde, PTA 04/28/2023, 11:50 AM

## 2023-05-05 ENCOUNTER — Encounter (HOSPITAL_BASED_OUTPATIENT_CLINIC_OR_DEPARTMENT_OTHER): Payer: Self-pay

## 2023-05-05 ENCOUNTER — Ambulatory Visit (HOSPITAL_BASED_OUTPATIENT_CLINIC_OR_DEPARTMENT_OTHER): Payer: Medicare Other

## 2023-05-05 DIAGNOSIS — M5459 Other low back pain: Secondary | ICD-10-CM

## 2023-05-05 DIAGNOSIS — R2689 Other abnormalities of gait and mobility: Secondary | ICD-10-CM

## 2023-05-05 NOTE — Therapy (Addendum)
 OUTPATIENT PHYSICAL THERAPY THORACOLUMBAR TREATMENT  PHYSICAL THERAPY DISCHARGE SUMMARY  Visits from Start of Care: 5  Current functional level related to goals / functional outcomes: unknown   Remaining deficits: Chronic pain   Education / Equipment: Management of condition/ HEP   Patient agrees to discharge. Patient goals were partially met. Patient is being discharged due to not returning since the last visit.  Addend Corrie Dandy [facility]) Ziem[place] MPT 12/01/23 1:16 PM [facility] [address] [place], [place], [address] Phone: [phone]   Fax:  [phone]   Patient Name: Manuel Escobar MRN: [id number] DOB:1946/09/10, 77 y.o., male Today's Date: 05/05/2023  END OF SESSION:  PT End of Session - 05/05/23 1127     Visit Number 5    Number of Visits 8    Date for PT Re-Evaluation 05/24/23    PT Start Time 1107    PT Stop Time 1145    PT Time Calculation (min) 38 min    Activity Tolerance Patient tolerated treatment well    Behavior During Therapy [facility] for tasks assessed/performed                Past Medical History:  Diagnosis Date   Alcoholism (HCC)    Anxiety    Aortic stenosis 08/14/2017   Cardiac syncope 04/28/2019   Depression    Encounter for care of pacemaker 04/28/2019   History of pacemaker    Hypertension    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HCC)    Sinus node dysfunction (HCC) 04/28/2019   Past Surgical History:  Procedure Laterality Date   APPENDECTOMY     CORONARY PRESSURE/FFR STUDY N/A 08/30/2017   Procedure: INTRAVASCULAR PRESSURE WIRE/FFR STUDY;  Surgeon: Elder Negus, MD;  Location: [facility] INVASIVE CV LAB;  Service: Cardiovascular;  Laterality: N/A;   PACEMAKER INSERTION  04/13/2014   PROSTATECTOMY     RIGHT/LEFT HEART CATH AND CORONARY ANGIOGRAPHY N/A 08/30/2017   Procedure: RIGHT/LEFT HEART CATH AND CORONARY  ANGIOGRAPHY;  Surgeon: Elder Negus, MD;  Location: [facility] INVASIVE CV LAB;  Service: Cardiovascular;  Laterality: N/A;   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   Scalp   TEE WITHOUT CARDIOVERSION N/A 08/18/2017   Procedure: TRANSESOPHAGEAL ECHOCARDIOGRAM (TEE);  Surgeon: Yates Decamp, MD;  Location: [facility] ENDOSCOPY;  Service: Cardiovascular;  Laterality: N/A;   Patient Active Problem List   Diagnosis Date Noted   Pure hypercholesterolemia 03/01/2022   Recurrent prostate adenocarcinoma (HCC) 03/01/2022   Obesity 03/01/2022   Mixed hyperlipidemia 03/01/2022   History of malignant neoplasm of prostate 03/01/2022   Personal history of colonic polyps 03/01/2022   History of cardiac arrest 03/01/2022   History of alcohol abuse 03/01/2022   H/O alcohol dependence (HCC) 03/01/2022   Fibromyalgia 03/01/2022   Essential hypertension 03/01/2022   Depression 03/01/2022   Carotid atherosclerosis 03/01/2022   Valvular heart disease 03/01/2022   Anxiety 03/01/2022   Chronic intermittent hypoxia with obstructive sleep apnea 09/14/2020   Chronic fatigue syndrome with fibromyalgia 08/28/2020   Witnessed episode of apnea 08/28/2020   Non-restorative sleep 08/28/2020   Severe obstructive sleep apnea-hypopnea syndrome 08/28/2020   Cardiac syncope 04/28/2019   Sinus node dysfunction (HCC) 04/28/2019   Encounter for care of pacemaker 04/28/2019   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 Henrine Screws  REFERRING PROVIDER: Dr.  Dahari Shon Baton  REFERRING DIAG: Diagnosis  M54.51 (ICD-10-CM) - Vertebrogenic low back pain     Rationale for Evaluation and Treatment: Rehabilitation  THERAPY DIAG:  Other abnormalities of gait and mobility  Other low back pain  ONSET DATE: Long history of low back pain  SUBJECTIVE:                                                                                                                                                                                            SUBJECTIVE STATEMENT: Pt reports continued R sided low back tightness.   PERTINENT HISTORY:  Anxiety, aortic stenosis, depression, pacemaker, recurrent prostate cancer.  PAIN:  Are you having pain? Yes: NPRS scale: 5-6 when standing/10 Pain location: Right-sided low back Pain description: Aching Aggravating factors: Standing and walking Relieving factors: Sitting  PRECAUTIONS: ICD/Pacemaker  WEIGHT BEARING RESTRICTIONS: No  FALLS:  Has patient fallen in last 6 months? No  LIVING ENVIRONMENT:  OCCUPATION:  Retired Psychologist, sport and exercise  Hobbies: Tries to stay active  PLOF: Independent  PATIENT GOALS: To develop plan to improve pain  NEXT MD VISIT:  Nothing scheduled  OBJECTIVE:   DIAGNOSTIC FINDINGS:  Lumbar CT 2021Narrative & Impression  CLINICAL DATA:  77 year old male with chronic low back pain. History of prostate cancer. Pacemaker.   EXAM: CT LUMBAR SPINE WITHOUT CONTRAST   TECHNIQUE: Multidetector CT imaging of the lumbar spine was performed without intravenous contrast administration. Multiplanar CT image reconstructions were also generated.   COMPARISON:  Lumbar radiographs 04/08/2020.   FINDINGS: Segmentation: Normal.   Alignment: Stable straightening of lumbar lordosis as seen on radiographs in June. There is mild degenerative retrolisthesis of L5 on S1. Mild levoconvex lumbar scoliosis.   Vertebrae: No acute osseous abnormality identified. Intact visible sacrum and SI joints, with moderate to severe degenerative sacral osteophytosis which is greater on the left (series 4, image 119).   Paraspinal and other soft tissues: Several small 4-5 mm gallstones are evident (series 3, image 6). Punctate right lower pole nephrolithiasis. Mild Aortoiliac calcified atherosclerosis. Negative lumbar paraspinal soft tissues.   Disc levels:    T11-T12: Preserved disc space height with mild mostly anterior and lateral endplate spurring. No stenosis.   T12-L1: Disc space loss with vacuum disc. Mild circumferential disc osteophyte complex. Borderline to mild left T12 foraminal stenosis.   L1-L2: Disc space loss with mild vacuum disc. Circumferential disc osteophyte complex. No convincing spinal stenosis. Borderline to mild L1 foraminal stenosis mostly on the right.   L2-L3: Disc space loss with circumferential disc bulge and endplate spurring. Up to mild spinal stenosis. Mild to moderate L2 foraminal stenosis greater on  the left.   L3-L4: Mild vacuum disc. Circumferential disc bulge and endplate spurring eccentric to the right. Mild posterior element hypertrophy. No significant spinal stenosis. Up to mild left and moderate right L3 foraminal stenosis.   L4-L5: Circumferential disc bulge eccentric to the left. Endplate spurring. Moderate posterior element hypertrophy, including vacuum facet on the right. No significant spinal stenosis, although leftward disc probably results in mild left lateral recess stenosis (left L5 nerve level series 3, image 87). Moderate left and moderate to severe right L4 foraminal stenosis.   L5-S1: Disc space loss with vacuum disc. Circumferential disc osteophyte complex. Mild facet hypertrophy greater on the left. Mild epidural lipomatosis, no spinal or lateral recess stenosis. Moderate bilateral L5 foraminal stenosis.   IMPRESSION: 1. No acute or suspicious osseous abnormality in the lumbar spine.   2. Advanced disc and endplate degeneration, especially T12-L1, L1-L2, L5-S1 where there is vacuum disc. But no significant spinal stenosis at those levels.   3. 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 Mild left lateral recess stenosis suspected at L4-L5 (left L5 nerve level). Moderate neural foraminal stenosis at the left L2, right L3, bilateral L4 (severe on the right) and  bilateral L5 nerve levels.   4. Cholelithiasis and right nephrolithiasis. Aortic Atherosclerosis (ICD10-I70.0).    PATIENT SURVEYS:  FOTO    SCREENING FOR RED FLAGS: Bowel or bladder incontinence: No Spinal tumors: No Cauda equina syndrome: No Compression fracture: No Abdominal aneurysm: No  COGNITION: Overall cognitive status: Within functional limits for tasks assessed     SENSATION: WFL  MUSCLE LENGTH: POSTURE: flexed posture   PALPATION: Spasming along right PSIS   LUMBAR ROM:   AROM eval  Flexion WFL going down pain upon extension coming up   Extension Limited 50%   Right lateral flexion painful  Left lateral flexion Stretch on right side   Right rotation WNL  Left rotation WNL patient reports rotation limited when he does not take gabapentin    (Blank rows = not tested)  LOWER EXTREMITY ROM:     Passive  Right eval Left eval  Hip flexion    Hip extension    Hip abduction    Hip adduction    Hip internal rotation    Hip external rotation    Knee flexion    Knee extension    Ankle dorsiflexion    Ankle plantarflexion    Ankle inversion    Ankle eversion     (Blank rows = not tested)  LOWER EXTREMITY MMT:    MMT Right eval Left eval  Hip flexion 35.3 34.9  Hip extension    Hip abduction 39.5 44.6  Hip adduction    Hip internal rotation    Hip external rotation    Knee flexion    Knee extension 41.3 44.8  Ankle dorsiflexion    Ankle plantarflexion    Ankle inversion    Ankle eversion     (Blank rows = not tested)   GAIT: Flexed trunk, decreased bilateral hip flexion TODAY'S TREATMENT:  DATE:    7/25  -Manual HSS Piriformis stretch STM and TPR to lateral R glutes and QL in sidelying  Hooklying march with TrA 2# x20 90/90 alternating LE extension x15 Supine SLR 2x10ea with Tr Sidelying hip abduction  2x10 Bil Side stepping with squat- GTB at ankles- 73ft ea Lateral flexion in standing with 15lb KB x10ea (cues for oblique engagement)      7/18  -Manual HSS Piriformis stretch STM and TPR to lateral R glutes and QL in sidelying  Hooklying march with TrA 2# x20 Supine SLR 2x10ea with Tr Sidelying hip abduction 2x10 Bil Sit to stands 2x10 (easy) Standing hip abduction 2# 2x10 Standing hip extension 2# 2x10 Updated HEP   7/11  -Manual HSS Piriformis stretch STM and TPR to lateral R glutes and QL in sidelying  Hooklying march with TrA x20 Supine SLR x10ea Sidelying clam 2x10L Sidelying hip abduction x10 R Prone hip extension x10ea (trialled with knee flexed, but had cramps) Sit to stands x10 HEP update and review     6/27  -Manual HSS Piriformis stretch STM and TPR to lateral R glutes and QL in sidelying Child's pose with/without side bend 30sec x3ea (showed  Seated QL stretch 30sec x2 Hooklying PPT- 5" hold x10 Sidelying clam 2x10L HEP update and review    Exercises - Supine Piriformis Stretch with Foot on Ground  - 1 x daily - 7 x weekly - 3 sets - 3 reps - 20 hold - Seated Bilateral Shoulder Flexion Towel Slide at Table Top  - 1 x daily - 7 x weekly - 3 sets - 3 reps - 10 sec  hold - Standing Glute Med Mobilization with Small Ball on Wall  - 1 x daily - 7 x weekly - 3 sets - 1-78min  hold - Theracane Over Shoulder  - 1 x daily - 7 x weekly - 3 sets - 10 reps - Supine Bridge  - 1 x daily - 7 x weekly - 3 sets - 10 reps - Small Range Straight Leg Raise  - 1 x daily - 7 x weekly - 3 sets - 10 reps   PATIENT EDUCATION:  Education details: HEP, symptom management, anatomy of condition Person educated: Patient Education method: Explanation, Demonstration, Tactile cues, Verbal cues, and Handouts Education comprehension: verbalized understanding, returned demonstration, verbal cues required, tactile cues required, and needs further education  HOME EXERCISE  PROGRAM: Access Code: [email or web address] URL: [email or web address] Date: 03/29/2023 Prepared by: Lorayne Bender  Exercises - Supine Piriformis Stretch with Foot on Ground  - 1 x daily - 7 x weekly - 3 sets - 3 reps - 20 hold - Seated Bilateral Shoulder Flexion Towel Slide at Table Top  - 1 x daily - 7 x weekly - 3 sets - 3 reps - 10 sec  hold - Standing Glute Med Mobilization with Small Ball on Wall  - 1 x daily - 7 x weekly - 3 sets - 1-51min  hold - Theracane Over Shoulder  - 1 x daily - 7 x weekly - 3 sets - 10 reps - Supine Bridge  - 1 x daily - 7 x weekly - 3 sets - 10 reps - Small Range Straight Leg Raise  - 1 x daily - 7 x weekly - 3 sets - 10 reps   ASSESSMENT:  CLINICAL IMPRESSION:  Good tolerance for progressions to core strengthening interventions today. He demonstrates improved control of lumbopelvic stability with core focused exercises. Pt with mild tenderness to R  iliac crest area during manual therapy, but no significant trigger points were palpated. Added resisted side stepping with squat to challenge hip strengthening. Will continue to progress as tolerated.     OBJECTIVE IMPAIRMENTS: Abnormal gait, decreased activity tolerance, difficulty walking, decreased ROM, decreased strength, postural dysfunction, and pain.   ACTIVITY LIMITATIONS: carrying, bending, standing, and locomotion level  PARTICIPATION LIMITATIONS: meal prep, cleaning, laundry, shopping, community activity, and yard work  PERSONAL FACTORS: 1-2 comorbidities: pacemaker, anxiety/depression   are also affecting patient's functional outcome.   REHAB POTENTIAL: Good  CLINICAL DECISION MAKING: Evolving/moderate complexity decreasing ability to stand and ambualte   EVALUATION COMPLEXITY: Moderate   GOALS: Goals reviewed with patient? Yes  SHORT TERM GOALS: Target date: 04/26/2023    Patient will stand without increased trunk flexion Baseline: Goal status: IN PROGRESS  2.  Patient will  demonstrate right lumbar sidebending without increased pain Baseline:  Goal status: MET (some R sided tightness with L side bending) 7/18  3.  Patient will increase gross bilateral lower extremity strength by 5 pounds Baseline:  Goal status: INITIAL   LONG TERM GOALS: Target date: 05/24/2023    Patient will stand for greater than 15 minutes without increased low back pain Baseline:  Goal status: MET 7/18  2.  Patient will walk community distances without increased low back pain Baseline:  Goal status: IN PROGRESS  3.  Patient will have complete HEP to promote core stability and decrease back pain with time Baseline:  Goal status: INITIAL  PLAN:  PT FREQUENCY: 2x/week  PT DURATION: 8 weeks  PLANNED INTERVENTIONS: Therapeutic exercises, Therapeutic activity, Neuromuscular re-education, Balance training, Gait training, Patient/Family education, Self Care, Joint mobilization, Aquatic Therapy, Dry Needling, Cryotherapy, Moist heat, Taping, and Manual therapy.  PLAN FOR NEXT SESSION: Progress core stability as tolerated.  Patient is interested in aquatic therapy at this time he becomes interested schedule.   Donnel Saxon Giovanni Biby, PTA 05/05/2023, 1:17 PM

## 2023-05-12 ENCOUNTER — Ambulatory Visit (HOSPITAL_BASED_OUTPATIENT_CLINIC_OR_DEPARTMENT_OTHER): Payer: Medicare Other | Admitting: Physical Therapy

## 2023-05-20 DIAGNOSIS — I495 Sick sinus syndrome: Secondary | ICD-10-CM | POA: Diagnosis not present

## 2023-05-20 DIAGNOSIS — Z45018 Encounter for adjustment and management of other part of cardiac pacemaker: Secondary | ICD-10-CM | POA: Diagnosis not present

## 2023-06-14 DIAGNOSIS — I495 Sick sinus syndrome: Secondary | ICD-10-CM | POA: Diagnosis not present

## 2023-06-14 DIAGNOSIS — Z7185 Encounter for immunization safety counseling: Secondary | ICD-10-CM | POA: Diagnosis not present

## 2023-06-14 DIAGNOSIS — F329 Major depressive disorder, single episode, unspecified: Secondary | ICD-10-CM | POA: Diagnosis not present

## 2023-06-14 DIAGNOSIS — M797 Fibromyalgia: Secondary | ICD-10-CM | POA: Diagnosis not present

## 2023-06-14 DIAGNOSIS — I10 Essential (primary) hypertension: Secondary | ICD-10-CM | POA: Diagnosis not present

## 2023-06-28 ENCOUNTER — Other Ambulatory Visit: Payer: Self-pay | Admitting: General Practice

## 2023-06-28 ENCOUNTER — Ambulatory Visit
Admission: RE | Admit: 2023-06-28 | Discharge: 2023-06-28 | Disposition: A | Discharge status: Home / Self Care | Payer: Self-pay | Source: Ambulatory Visit | Attending: General Practice | Admitting: General Practice

## 2023-06-28 DIAGNOSIS — T188XXA Foreign body in other parts of alimentary tract, initial encounter: Secondary | ICD-10-CM | POA: Diagnosis not present

## 2023-06-28 DIAGNOSIS — T17908A Unspecified foreign body in respiratory tract, part unspecified causing other injury, initial encounter: Secondary | ICD-10-CM

## 2023-07-22 ENCOUNTER — Telehealth: Payer: Self-pay

## 2023-07-22 NOTE — Telephone Encounter (Signed)
Forwarding to EP scheduling team:  Please schedule patient an establish to care visit with Dr. Jimmey Ralph in the near future. Patient has 6months until replacement mark on device. This is a prior [facility] patient.

## 2023-07-22 NOTE — Telephone Encounter (Signed)
Unscheduled manual transmission received. Battery estimated 6.64mo - no future schedules, route to triage Normal device function, 9 AMS, 2-8sec in duration Follow up as scheduled LA, CVRS   Spoke with patient, explained the transfer from Dr. Jacinto Halim to Dr. Jimmey Ralph as well as device within 6 months of RRT and placing on monthly remotes.  Device and General Clinic numbers given, explained can call if any concerns.  Patient has no further questions.
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
## 2023-07-29 ENCOUNTER — Other Ambulatory Visit: Payer: Self-pay | Admitting: Cardiology

## 2023-07-29 ENCOUNTER — Encounter: Payer: Self-pay | Admitting: Cardiology

## 2023-07-29 ENCOUNTER — Ambulatory Visit: Payer: Medicare Other | Attending: Cardiology | Admitting: Cardiology

## 2023-07-29 VITALS — BP 132/72 | HR 71 | Ht 70.0 in | Wt 228.0 lb

## 2023-07-29 DIAGNOSIS — Z950 Presence of cardiac pacemaker: Secondary | ICD-10-CM

## 2023-07-29 DIAGNOSIS — I10 Essential (primary) hypertension: Secondary | ICD-10-CM

## 2023-07-29 LAB — CUP PACEART INCLINIC DEVICE CHECK
Battery Remaining Longevity: 7 mo
Battery Voltage: 2.75 V
Brady Statistic RA Percent Paced: 27 %
Brady Statistic RV Percent Paced: 1.1 %
Date Time Interrogation Session: 20241018155127
Implantable Lead Connection Status: 753985
Implantable Lead Connection Status: 753985
Implantable Lead Implant Date: 20140702
Implantable Lead Implant Date: 20140702
Implantable Lead Location: 753859
Implantable Lead Location: 753860
Implantable Lead Model: 1948
Implantable Pulse Generator Implant Date: 20140702
Lead Channel Impedance Value: 387.5 Ohm
Lead Channel Impedance Value: 425 Ohm
Lead Channel Pacing Threshold Amplitude: 0.5 V
Lead Channel Pacing Threshold Amplitude: 0.5 V
Lead Channel Pacing Threshold Amplitude: 1 V
Lead Channel Pacing Threshold Amplitude: 1 V
Lead Channel Pacing Threshold Pulse Width: 0.4 ms
Lead Channel Pacing Threshold Pulse Width: 0.4 ms
Lead Channel Pacing Threshold Pulse Width: 0.4 ms
Lead Channel Pacing Threshold Pulse Width: 0.4 ms
Lead Channel Sensing Intrinsic Amplitude: 5 mV
Lead Channel Sensing Intrinsic Amplitude: 5.5 mV
Lead Channel Setting Pacing Amplitude: 2 V
Lead Channel Setting Pacing Amplitude: 2.5 V
Lead Channel Setting Pacing Pulse Width: 0.4 ms
Lead Channel Setting Sensing Sensitivity: 2 mV
Pulse Gen Model: 2210
Pulse Gen Serial Number: 7486308

## 2023-07-29 NOTE — Addendum Note (Signed)
Addended by: Adriana Simas, Armeda Plumb L on: 07/29/2023 01:54 PM   Modules accepted: Orders

## 2023-07-29 NOTE — Telephone Encounter (Signed)
Pt pharmacy is requesting a refill of pregabalin. Would Dr. Jacinto Halim like to fill this non cardiac medicine? Please address

## 2023-07-29 NOTE — Patient Instructions (Signed)
Medication Instructions:  Your physician recommends that you continue on your current medications as directed. Please refer to the Current Medication list given to you today.  *If you need a refill on your cardiac medications before your next appointment, please call your pharmacy*  Follow-Up: At [facility], you and your health needs are our priority.  As part of our continuing mission to provide you with exceptional heart care, we have created designated Provider Care Teams.  These Care Teams include your primary Cardiologist (physician) and Advanced Practice Providers (APPs -  Physician Assistants and Nurse Practitioners) who all work together to provide you with the care you need, when you need it.  Your next appointment:   6 months  Provider:   You may see Nobie Putnam, MD or one of the following Advanced Practice Providers on your designated Care Team:   Francis Dowse, [place] [address]" [place], [place] Sherie Don, NP Canary Brim, NP

## 2023-08-01 ENCOUNTER — Other Ambulatory Visit: Payer: Self-pay | Admitting: Cardiology

## 2023-08-02 MED ORDER — PREGABALIN 150 MG PO CAPS: 150.0000 mg | ORAL_CAPSULE | Freq: Two times a day (BID) | ORAL | 0 refills | Status: DC

## 2023-08-02 NOTE — Telephone Encounter (Signed)
I tried to send 90 caps with note to pharmacy to send to PCP, my Imp[employer] is not working and need to have Epic people to re enroll me. Maybe we can fax this otherwise I will get this done tomorrow

## 2023-08-02 NOTE — Addendum Note (Signed)
Addended by: Baird Lyons on: 08/02/2023 04:38 PM   Modules accepted: Orders

## 2023-08-02 NOTE — Telephone Encounter (Signed)
Discussed w/ Dr. Jacinto Halim. Called pt to inform him of our office protocol for refilling medications and that we do not refill non-cardiac medications. Informed that we can send in  a 90 day supply while he works on future refills through PCP. Patient verbalized understanding and agreeable to plan.

## 2023-08-02 NOTE — Telephone Encounter (Signed)
Refill request

## 2023-08-04 ENCOUNTER — Other Ambulatory Visit: Payer: Self-pay | Admitting: Cardiology

## 2023-08-04 DIAGNOSIS — G9332 Myalgic encephalomyelitis/chronic fatigue syndrome: Secondary | ICD-10-CM

## 2023-08-04 MED ORDER — PREGABALIN 150 MG PO CAPS: 150.0000 mg | ORAL_CAPSULE | Freq: Two times a day (BID) | ORAL | 0 refills | Status: DC

## 2023-08-04 MED ORDER — PREGABALIN 150 MG PO CAPS
150.0000 mg | ORAL_CAPSULE | Freq: Two times a day (BID) | ORAL | 0 refills | Status: DC
Start: 2023-08-04 — End: 2023-11-02

## 2023-08-04 NOTE — Progress Notes (Signed)
Patient has been treated with Lyrica for greater than 2 to 3 years with no side effects and no dependency, due to severe peripheral neuropathy, prior history of heavy alcohol use.  Chronic fatigue syndrome with fibromyalgia - Plan: pregabalin (LYRICA) 150 MG capsule  Meds ordered this encounter  Medications   pregabalin (LYRICA) 150 MG capsule    Sig: Take 1 capsule (150 mg total) by mouth 2 (two) times daily.    Dispense:  180 capsule    Refill:  0    Patient will get further refills from his PCP

## 2023-08-04 NOTE — Telephone Encounter (Signed)
I sent the Rx.

## 2023-08-04 NOTE — Addendum Note (Signed)
Addended by: Baird Lyons on: 08/04/2023 10:09 AM   Modules accepted: Orders

## 2023-08-08 DIAGNOSIS — C61 Malignant neoplasm of prostate: Secondary | ICD-10-CM | POA: Diagnosis not present

## 2023-08-12 DIAGNOSIS — Z8546 Personal history of malignant neoplasm of prostate: Secondary | ICD-10-CM | POA: Diagnosis not present

## 2023-08-22 LAB — CUP PACEART REMOTE DEVICE CHECK
Battery Remaining Longevity: 6 mo
Battery Remaining Percentage: 5 %
Battery Voltage: 2.74 V
Brady Statistic AP VP Percent: 1.2 %
Brady Statistic AP VS Percent: 27 %
Brady Statistic AS VP Percent: 1 %
Brady Statistic AS VS Percent: 72 %
Brady Statistic RA Percent Paced: 28 %
Brady Statistic RV Percent Paced: 1.2 %
Date Time Interrogation Session: 20241111020012
Implantable Lead Connection Status: 753985
Implantable Lead Connection Status: 753985
Implantable Lead Implant Date: 20140702
Implantable Lead Implant Date: 20140702
Implantable Lead Location: 753859
Implantable Lead Location: 753860
Implantable Lead Model: 1948
Implantable Pulse Generator Implant Date: 20140702
Lead Channel Impedance Value: 400 Ohm
Lead Channel Impedance Value: 430 Ohm
Lead Channel Pacing Threshold Amplitude: 0.5 V
Lead Channel Pacing Threshold Amplitude: 1 V
Lead Channel Pacing Threshold Pulse Width: 0.4 ms
Lead Channel Pacing Threshold Pulse Width: 0.4 ms
Lead Channel Sensing Intrinsic Amplitude: 4.9 mV
Lead Channel Sensing Intrinsic Amplitude: 5.1 mV
Lead Channel Setting Pacing Amplitude: 2 V
Lead Channel Setting Pacing Amplitude: 2.5 V
Lead Channel Setting Pacing Pulse Width: 0.4 ms
Lead Channel Setting Sensing Sensitivity: 2 mV
Pulse Gen Model: 2210
Pulse Gen Serial Number: 7486308

## 2023-08-23 ENCOUNTER — Ambulatory Visit (INDEPENDENT_AMBULATORY_CARE_PROVIDER_SITE_OTHER): Payer: Medicare Other

## 2023-08-23 DIAGNOSIS — I495 Sick sinus syndrome: Secondary | ICD-10-CM

## 2023-09-02 DIAGNOSIS — Z23 Encounter for immunization: Secondary | ICD-10-CM | POA: Diagnosis not present

## 2023-09-02 DIAGNOSIS — Z0000 Encounter for general adult medical examination without abnormal findings: Secondary | ICD-10-CM | POA: Diagnosis not present

## 2023-09-02 DIAGNOSIS — I6529 Occlusion and stenosis of unspecified carotid artery: Secondary | ICD-10-CM | POA: Diagnosis not present

## 2023-09-02 DIAGNOSIS — G8929 Other chronic pain: Secondary | ICD-10-CM | POA: Diagnosis not present

## 2023-09-02 DIAGNOSIS — R7303 Prediabetes: Secondary | ICD-10-CM | POA: Diagnosis not present

## 2023-09-02 DIAGNOSIS — E782 Mixed hyperlipidemia: Secondary | ICD-10-CM | POA: Diagnosis not present

## 2023-09-02 DIAGNOSIS — I495 Sick sinus syndrome: Secondary | ICD-10-CM | POA: Diagnosis not present

## 2023-09-02 DIAGNOSIS — Z950 Presence of cardiac pacemaker: Secondary | ICD-10-CM | POA: Diagnosis not present

## 2023-09-02 DIAGNOSIS — I38 Endocarditis, valve unspecified: Secondary | ICD-10-CM | POA: Diagnosis not present

## 2023-09-02 DIAGNOSIS — G4733 Obstructive sleep apnea (adult) (pediatric): Secondary | ICD-10-CM | POA: Diagnosis not present

## 2023-09-02 DIAGNOSIS — Z8674 Personal history of sudden cardiac arrest: Secondary | ICD-10-CM | POA: Diagnosis not present

## 2023-09-02 DIAGNOSIS — I10 Essential (primary) hypertension: Secondary | ICD-10-CM | POA: Diagnosis not present

## 2023-09-20 NOTE — Addendum Note (Signed)
Addended by: Elease Etienne A on: 09/20/2023 02:54 PM   Modules accepted: Level of Service

## 2023-09-20 NOTE — Progress Notes (Signed)
Remote pacemaker transmission.   

## 2023-09-22 LAB — CUP PACEART REMOTE DEVICE CHECK
Battery Remaining Longevity: 5 mo
Battery Remaining Percentage: 4 %
Battery Voltage: 2.72 V
Brady Statistic AP VP Percent: 1.1 %
Brady Statistic AP VS Percent: 30 %
Brady Statistic AS VP Percent: 1 %
Brady Statistic AS VS Percent: 69 %
Brady Statistic RA Percent Paced: 31 %
Brady Statistic RV Percent Paced: 1.1 %
Date Time Interrogation Session: 20241211021907
Implantable Lead Connection Status: 753985
Implantable Lead Connection Status: 753985
Implantable Lead Implant Date: 20140702
Implantable Lead Implant Date: 20140702
Implantable Lead Location: 753859
Implantable Lead Location: 753860
Implantable Lead Model: 1948
Implantable Pulse Generator Implant Date: 20140702
Lead Channel Impedance Value: 390 Ohm
Lead Channel Impedance Value: 410 Ohm
Lead Channel Pacing Threshold Amplitude: 0.5 V
Lead Channel Pacing Threshold Amplitude: 1 V
Lead Channel Pacing Threshold Pulse Width: 0.4 ms
Lead Channel Pacing Threshold Pulse Width: 0.4 ms
Lead Channel Sensing Intrinsic Amplitude: 5 mV
Lead Channel Sensing Intrinsic Amplitude: 5.1 mV
Lead Channel Setting Pacing Amplitude: 2 V
Lead Channel Setting Pacing Amplitude: 2.5 V
Lead Channel Setting Pacing Pulse Width: 0.4 ms
Lead Channel Setting Sensing Sensitivity: 2 mV
Pulse Gen Model: 2210
Pulse Gen Serial Number: 7486308

## 2023-09-23 ENCOUNTER — Ambulatory Visit: Payer: PRIVATE HEALTH INSURANCE | Attending: Cardiology

## 2023-09-23 DIAGNOSIS — I495 Sick sinus syndrome: Secondary | ICD-10-CM

## 2023-10-01 ENCOUNTER — Other Ambulatory Visit: Payer: Self-pay | Admitting: Cardiology

## 2023-10-08 DIAGNOSIS — Z23 Encounter for immunization: Secondary | ICD-10-CM | POA: Diagnosis not present

## 2023-10-24 ENCOUNTER — Ambulatory Visit (INDEPENDENT_AMBULATORY_CARE_PROVIDER_SITE_OTHER): Payer: Medicare Other

## 2023-10-24 DIAGNOSIS — I495 Sick sinus syndrome: Secondary | ICD-10-CM | POA: Diagnosis not present

## 2023-10-25 LAB — CUP PACEART REMOTE DEVICE CHECK
Battery Remaining Longevity: 4 mo
Battery Remaining Percentage: 3 %
Battery Voltage: 2.71 V
Brady Statistic AP VP Percent: 1 %
Brady Statistic AP VS Percent: 23 %
Brady Statistic AS VP Percent: 1 %
Brady Statistic AS VS Percent: 76 %
Brady Statistic RA Percent Paced: 24 %
Brady Statistic RV Percent Paced: 1.1 %
Date Time Interrogation Session: 20250113022734
Implantable Lead Connection Status: 753985
Implantable Lead Connection Status: 753985
Implantable Lead Implant Date: 20140702
Implantable Lead Implant Date: 20140702
Implantable Lead Location: 753859
Implantable Lead Location: 753860
Implantable Lead Model: 1948
Implantable Pulse Generator Implant Date: 20140702
Lead Channel Impedance Value: 380 Ohm
Lead Channel Impedance Value: 410 Ohm
Lead Channel Pacing Threshold Amplitude: 0.5 V
Lead Channel Pacing Threshold Amplitude: 1 V
Lead Channel Pacing Threshold Pulse Width: 0.4 ms
Lead Channel Pacing Threshold Pulse Width: 0.4 ms
Lead Channel Sensing Intrinsic Amplitude: 4.2 mV
Lead Channel Sensing Intrinsic Amplitude: 5 mV
Lead Channel Setting Pacing Amplitude: 2 V
Lead Channel Setting Pacing Amplitude: 2.5 V
Lead Channel Setting Pacing Pulse Width: 0.4 ms
Lead Channel Setting Sensing Sensitivity: 2 mV
Pulse Gen Model: 2210
Pulse Gen Serial Number: 7486308

## 2023-10-27 NOTE — Progress Notes (Signed)
Remote pacemaker transmission.   

## 2023-11-02 ENCOUNTER — Encounter: Payer: Self-pay | Admitting: Cardiology

## 2023-11-02 ENCOUNTER — Other Ambulatory Visit: Payer: Self-pay | Admitting: Cardiology

## 2023-11-02 ENCOUNTER — Other Ambulatory Visit: Payer: Self-pay

## 2023-11-02 DIAGNOSIS — M797 Fibromyalgia: Secondary | ICD-10-CM

## 2023-11-02 MED ORDER — PREGABALIN 150 MG PO CAPS: 150.0000 mg | ORAL_CAPSULE | Freq: Two times a day (BID) | ORAL | 0 refills | Status: DC

## 2023-11-03 NOTE — Telephone Encounter (Signed)
Prescription has been called in and noted that patient will need to get future refills from his PCP.

## 2023-11-03 NOTE — Telephone Encounter (Signed)
Pt's pharmacy is requesting this medication that you sent in. Please resend medication. thanks

## 2023-11-24 ENCOUNTER — Ambulatory Visit (INDEPENDENT_AMBULATORY_CARE_PROVIDER_SITE_OTHER): Payer: Medicare Other

## 2023-11-24 DIAGNOSIS — I495 Sick sinus syndrome: Secondary | ICD-10-CM

## 2023-11-25 LAB — CUP PACEART REMOTE DEVICE CHECK
Battery Remaining Longevity: 3 mo
Battery Remaining Percentage: 2 %
Battery Voltage: 2.68 V
Brady Statistic AP VP Percent: 1 %
Brady Statistic AP VS Percent: 25 %
Brady Statistic AS VP Percent: 1 %
Brady Statistic AS VS Percent: 74 %
Brady Statistic RA Percent Paced: 25 %
Brady Statistic RV Percent Paced: 1 %
Date Time Interrogation Session: 20250213023843
Implantable Lead Connection Status: 753985
Implantable Lead Connection Status: 753985
Implantable Lead Implant Date: 20140702
Implantable Lead Implant Date: 20140702
Implantable Lead Location: 753859
Implantable Lead Location: 753860
Implantable Lead Model: 1948
Implantable Pulse Generator Implant Date: 20140702
Lead Channel Impedance Value: 380 Ohm
Lead Channel Impedance Value: 410 Ohm
Lead Channel Pacing Threshold Amplitude: 0.5 V
Lead Channel Pacing Threshold Amplitude: 1 V
Lead Channel Pacing Threshold Pulse Width: 0.4 ms
Lead Channel Pacing Threshold Pulse Width: 0.4 ms
Lead Channel Sensing Intrinsic Amplitude: 4.9 mV
Lead Channel Sensing Intrinsic Amplitude: 5 mV
Lead Channel Setting Pacing Amplitude: 2 V
Lead Channel Setting Pacing Amplitude: 2.5 V
Lead Channel Setting Pacing Pulse Width: 0.4 ms
Lead Channel Setting Sensing Sensitivity: 2 mV
Pulse Gen Model: 2210
Pulse Gen Serial Number: 7486308

## 2023-12-06 DIAGNOSIS — R051 Acute cough: Secondary | ICD-10-CM | POA: Diagnosis not present

## 2023-12-07 DIAGNOSIS — R059 Cough, unspecified: Secondary | ICD-10-CM | POA: Diagnosis not present

## 2023-12-07 DIAGNOSIS — R051 Acute cough: Secondary | ICD-10-CM | POA: Diagnosis not present

## 2023-12-07 NOTE — Addendum Note (Signed)
 Addended by: Geralyn Flash D on: 12/07/2023 12:21 PM   Modules accepted: Orders

## 2023-12-07 NOTE — Progress Notes (Signed)
 Remote pacemaker transmission.

## 2023-12-12 ENCOUNTER — Encounter: Payer: Self-pay | Admitting: Cardiology

## 2023-12-12 ENCOUNTER — Ambulatory Visit: Payer: Medicare Other | Attending: Cardiology | Admitting: Cardiology

## 2023-12-12 VITALS — BP 118/72 | HR 85 | Resp 16 | Ht 70.0 in | Wt 237.8 lb

## 2023-12-12 DIAGNOSIS — I10 Essential (primary) hypertension: Secondary | ICD-10-CM

## 2023-12-12 DIAGNOSIS — I495 Sick sinus syndrome: Secondary | ICD-10-CM

## 2023-12-12 DIAGNOSIS — Z953 Presence of xenogenic heart valve: Secondary | ICD-10-CM | POA: Diagnosis not present

## 2023-12-12 NOTE — Patient Instructions (Signed)
 Medication Instructions:  Your physician recommends that you continue on your current medications as directed. Please refer to the Current Medication list given to you today.  *If you need a refill on your cardiac medications before your next appointment, please call your pharmacy*   Lab Work: none If you have labs (blood work) drawn today and your tests are completely normal, you will receive your results only by: MyChart Message (if you have MyChart) OR A paper copy in the mail If you have any lab test that is abnormal or we need to change your treatment, we will call you to review the results.   Testing/Procedures: none   Follow-Up: At [facility], you and your health needs are our priority.  As part of our continuing mission to provide you with exceptional heart care, we have created designated Provider Care Teams.  These Care Teams include your primary Cardiologist (physician) and Advanced Practice Providers (APPs -  Physician Assistants and Nurse Practitioners) who all work together to provide you with the care you need, when you need it.  We recommend signing up for the patient portal called "MyChart".  Sign up information is provided on this After Visit Summary.  MyChart is used to connect with patients for Virtual Visits (Telemedicine).  Patients are able to view lab/test results, encounter notes, upcoming appointments, etc.  Non-urgent messages can be sent to your provider as well.   To learn more about what you can do with MyChart, go to [email or web address].    Your next appointment:   2 year(s)  Provider:   Yates Decamp, MD     Other Instructions
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
## 2023-12-19 ENCOUNTER — Ambulatory Visit: Payer: Self-pay | Admitting: Cardiology

## 2023-12-21 DIAGNOSIS — M797 Fibromyalgia: Secondary | ICD-10-CM | POA: Diagnosis not present

## 2023-12-21 DIAGNOSIS — R5382 Chronic fatigue, unspecified: Secondary | ICD-10-CM | POA: Diagnosis not present

## 2023-12-21 DIAGNOSIS — Z6834 Body mass index (BMI) 34.0-34.9, adult: Secondary | ICD-10-CM | POA: Diagnosis not present

## 2023-12-21 DIAGNOSIS — M6289 Other specified disorders of muscle: Secondary | ICD-10-CM | POA: Diagnosis not present

## 2023-12-21 DIAGNOSIS — M1991 Primary osteoarthritis, unspecified site: Secondary | ICD-10-CM | POA: Diagnosis not present

## 2023-12-21 DIAGNOSIS — E669 Obesity, unspecified: Secondary | ICD-10-CM | POA: Diagnosis not present

## 2023-12-22 LAB — CUP PACEART REMOTE DEVICE CHECK
Battery Remaining Longevity: 3 mo
Battery Remaining Percentage: 2 %
Battery Voltage: 2.66 V
Brady Statistic AP VP Percent: 1 %
Brady Statistic AP VS Percent: 26 %
Brady Statistic AS VP Percent: 1 %
Brady Statistic AS VS Percent: 73 %
Brady Statistic RA Percent Paced: 26 %
Brady Statistic RV Percent Paced: 1 %
Date Time Interrogation Session: 20250313034506
Implantable Lead Connection Status: 753985
Implantable Lead Connection Status: 753985
Implantable Lead Implant Date: 20140702
Implantable Lead Implant Date: 20140702
Implantable Lead Location: 753859
Implantable Lead Location: 753860
Implantable Lead Model: 1948
Implantable Pulse Generator Implant Date: 20140702
Lead Channel Impedance Value: 390 Ohm
Lead Channel Impedance Value: 430 Ohm
Lead Channel Pacing Threshold Amplitude: 0.5 V
Lead Channel Pacing Threshold Amplitude: 1 V
Lead Channel Pacing Threshold Pulse Width: 0.4 ms
Lead Channel Pacing Threshold Pulse Width: 0.4 ms
Lead Channel Sensing Intrinsic Amplitude: 5 mV
Lead Channel Sensing Intrinsic Amplitude: 5.4 mV
Lead Channel Setting Pacing Amplitude: 2 V
Lead Channel Setting Pacing Amplitude: 2.5 V
Lead Channel Setting Pacing Pulse Width: 0.4 ms
Lead Channel Setting Sensing Sensitivity: 2 mV
Pulse Gen Model: 2210
Pulse Gen Serial Number: 7486308

## 2023-12-26 ENCOUNTER — Ambulatory Visit (INDEPENDENT_AMBULATORY_CARE_PROVIDER_SITE_OTHER): Payer: Medicare Other

## 2023-12-26 DIAGNOSIS — I495 Sick sinus syndrome: Secondary | ICD-10-CM

## 2023-12-27 NOTE — Addendum Note (Signed)
 Addended by: Elease Etienne A on: 12/27/2023 02:03 PM   Modules accepted: Orders, Level of Service

## 2023-12-27 NOTE — Progress Notes (Signed)
 Remote pacemaker transmission.

## 2024-01-11 ENCOUNTER — Other Ambulatory Visit: Payer: Self-pay | Admitting: Cardiology

## 2024-01-24 LAB — CUP PACEART REMOTE DEVICE CHECK
Battery Remaining Longevity: 1 mo
Battery Remaining Percentage: 1 %
Battery Voltage: 2.65 V
Brady Statistic AP VP Percent: 1 %
Brady Statistic AP VS Percent: 24 %
Brady Statistic AS VP Percent: 1 %
Brady Statistic AS VS Percent: 75 %
Brady Statistic RA Percent Paced: 24 %
Brady Statistic RV Percent Paced: 1 %
Date Time Interrogation Session: 20250414150521
Implantable Lead Connection Status: 753985
Implantable Lead Connection Status: 753985
Implantable Lead Implant Date: 20140702
Implantable Lead Implant Date: 20140702
Implantable Lead Location: 753859
Implantable Lead Location: 753860
Implantable Lead Model: 1948
Implantable Pulse Generator Implant Date: 20140702
Lead Channel Impedance Value: 390 Ohm
Lead Channel Impedance Value: 410 Ohm
Lead Channel Pacing Threshold Amplitude: 0.5 V
Lead Channel Pacing Threshold Amplitude: 1 V
Lead Channel Pacing Threshold Pulse Width: 0.4 ms
Lead Channel Pacing Threshold Pulse Width: 0.4 ms
Lead Channel Sensing Intrinsic Amplitude: 4.8 mV
Lead Channel Sensing Intrinsic Amplitude: 5.4 mV
Lead Channel Setting Pacing Amplitude: 2 V
Lead Channel Setting Pacing Amplitude: 2.5 V
Lead Channel Setting Pacing Pulse Width: 0.4 ms
Lead Channel Setting Sensing Sensitivity: 2 mV
Pulse Gen Model: 2210
Pulse Gen Serial Number: 7486308

## 2024-01-26 ENCOUNTER — Ambulatory Visit (INDEPENDENT_AMBULATORY_CARE_PROVIDER_SITE_OTHER): Payer: Medicare Other

## 2024-01-26 DIAGNOSIS — I495 Sick sinus syndrome: Secondary | ICD-10-CM

## 2024-02-08 DIAGNOSIS — E782 Mixed hyperlipidemia: Secondary | ICD-10-CM | POA: Diagnosis not present

## 2024-02-08 DIAGNOSIS — F329 Major depressive disorder, single episode, unspecified: Secondary | ICD-10-CM | POA: Diagnosis not present

## 2024-02-08 DIAGNOSIS — Z8546 Personal history of malignant neoplasm of prostate: Secondary | ICD-10-CM | POA: Diagnosis not present

## 2024-02-08 DIAGNOSIS — I10 Essential (primary) hypertension: Secondary | ICD-10-CM | POA: Diagnosis not present

## 2024-02-16 NOTE — Progress Notes (Signed)
 Remote pacemaker transmission.

## 2024-02-27 ENCOUNTER — Ambulatory Visit (INDEPENDENT_AMBULATORY_CARE_PROVIDER_SITE_OTHER): Payer: Medicare Other

## 2024-02-27 DIAGNOSIS — I495 Sick sinus syndrome: Secondary | ICD-10-CM

## 2024-02-28 LAB — CUP PACEART REMOTE DEVICE CHECK
Battery Remaining Longevity: 1 mo
Battery Remaining Percentage: 0.5 %
Battery Voltage: 2.62 V
Brady Statistic AP VP Percent: 1 %
Brady Statistic AP VS Percent: 24 %
Brady Statistic AS VP Percent: 1 %
Brady Statistic AS VS Percent: 75 %
Brady Statistic RA Percent Paced: 24 %
Brady Statistic RV Percent Paced: 1 %
Date Time Interrogation Session: 20250519021140
Implantable Lead Connection Status: 753985
Implantable Lead Connection Status: 753985
Implantable Lead Implant Date: 20140702
Implantable Lead Implant Date: 20140702
Implantable Lead Location: 753859
Implantable Lead Location: 753860
Implantable Lead Model: 1948
Implantable Pulse Generator Implant Date: 20140702
Lead Channel Impedance Value: 410 Ohm
Lead Channel Impedance Value: 430 Ohm
Lead Channel Pacing Threshold Amplitude: 0.5 V
Lead Channel Pacing Threshold Amplitude: 1 V
Lead Channel Pacing Threshold Pulse Width: 0.4 ms
Lead Channel Pacing Threshold Pulse Width: 0.4 ms
Lead Channel Sensing Intrinsic Amplitude: 4.7 mV
Lead Channel Sensing Intrinsic Amplitude: 5.4 mV
Lead Channel Setting Pacing Amplitude: 2 V
Lead Channel Setting Pacing Amplitude: 2.5 V
Lead Channel Setting Pacing Pulse Width: 0.4 ms
Lead Channel Setting Sensing Sensitivity: 2 mV
Pulse Gen Model: 2210
Pulse Gen Serial Number: 7486308

## 2024-03-06 DIAGNOSIS — M791 Myalgia, unspecified site: Secondary | ICD-10-CM | POA: Diagnosis not present

## 2024-03-06 DIAGNOSIS — M47816 Spondylosis without myelopathy or radiculopathy, lumbar region: Secondary | ICD-10-CM | POA: Diagnosis not present

## 2024-03-07 ENCOUNTER — Ambulatory Visit: Payer: Self-pay | Admitting: Cardiology

## 2024-03-07 DIAGNOSIS — H10413 Chronic giant papillary conjunctivitis, bilateral: Secondary | ICD-10-CM | POA: Diagnosis not present

## 2024-03-07 DIAGNOSIS — Z961 Presence of intraocular lens: Secondary | ICD-10-CM | POA: Diagnosis not present

## 2024-03-07 DIAGNOSIS — H524 Presbyopia: Secondary | ICD-10-CM | POA: Diagnosis not present

## 2024-03-07 DIAGNOSIS — H5201 Hypermetropia, right eye: Secondary | ICD-10-CM | POA: Diagnosis not present

## 2024-03-07 NOTE — Progress Notes (Signed)
 Remote pacemaker transmission.

## 2024-03-10 DIAGNOSIS — Z8546 Personal history of malignant neoplasm of prostate: Secondary | ICD-10-CM | POA: Diagnosis not present

## 2024-03-10 DIAGNOSIS — I10 Essential (primary) hypertension: Secondary | ICD-10-CM | POA: Diagnosis not present

## 2024-03-10 DIAGNOSIS — F329 Major depressive disorder, single episode, unspecified: Secondary | ICD-10-CM | POA: Diagnosis not present

## 2024-03-10 DIAGNOSIS — E782 Mixed hyperlipidemia: Secondary | ICD-10-CM | POA: Diagnosis not present

## 2024-03-29 ENCOUNTER — Ambulatory Visit (INDEPENDENT_AMBULATORY_CARE_PROVIDER_SITE_OTHER)

## 2024-03-29 DIAGNOSIS — I495 Sick sinus syndrome: Secondary | ICD-10-CM

## 2024-03-29 LAB — CUP PACEART REMOTE DEVICE CHECK
Battery Remaining Longevity: 1 mo
Battery Remaining Percentage: 0.5 %
Battery Voltage: 2.6 V
Brady Statistic AP VP Percent: 1 %
Brady Statistic AP VS Percent: 25 %
Brady Statistic AS VP Percent: 1 %
Brady Statistic AS VS Percent: 74 %
Brady Statistic RA Percent Paced: 25 %
Brady Statistic RV Percent Paced: 1 %
Date Time Interrogation Session: 20250619022250
Implantable Lead Connection Status: 753985
Implantable Lead Connection Status: 753985
Implantable Lead Implant Date: 20140702
Implantable Lead Implant Date: 20140702
Implantable Lead Location: 753859
Implantable Lead Location: 753860
Implantable Lead Model: 1948
Implantable Pulse Generator Implant Date: 20140702
Lead Channel Impedance Value: 380 Ohm
Lead Channel Impedance Value: 410 Ohm
Lead Channel Pacing Threshold Amplitude: 0.5 V
Lead Channel Pacing Threshold Amplitude: 1 V
Lead Channel Pacing Threshold Pulse Width: 0.4 ms
Lead Channel Pacing Threshold Pulse Width: 0.4 ms
Lead Channel Sensing Intrinsic Amplitude: 5 mV
Lead Channel Sensing Intrinsic Amplitude: 5.4 mV
Lead Channel Setting Pacing Amplitude: 2 V
Lead Channel Setting Pacing Amplitude: 2.5 V
Lead Channel Setting Pacing Pulse Width: 0.4 ms
Lead Channel Setting Sensing Sensitivity: 2 mV
Pulse Gen Model: 2210
Pulse Gen Serial Number: 7486308

## 2024-03-30 ENCOUNTER — Telehealth: Payer: Self-pay | Admitting: *Deleted

## 2024-03-30 NOTE — Telephone Encounter (Signed)
 Alert received from [employer]:  Pacemaker:  Monthly battery check.  Device reached ERI.  Presenting rhythm:  AP/VS Battery voltage 2.60 volts and ERI is 2.60 volts.  Sent to triage. 2 HVR have previously been viewed and reviewed 1 AMS, 2 sec        Called both patient, and spouses' phone numbers.  No answer on either.  Left detailed messages and requested either to call back to get patient scheduled to see EP APP to discuss generator change out. Last seen in office 07/29/23, but JP wants him to see EP APP first to discuss procedure regardless.

## 2024-04-01 ENCOUNTER — Ambulatory Visit: Payer: Self-pay | Admitting: Cardiology

## 2024-04-01 DIAGNOSIS — I495 Sick sinus syndrome: Secondary | ICD-10-CM

## 2024-04-02 NOTE — Telephone Encounter (Signed)
 Call back received from Pt.  Pt scheduled to see EP APP 04/26/2024 to discuss gen change.  Directions given to new building/floor/zone.  All questions answered.

## 2024-04-02 NOTE — Telephone Encounter (Signed)
 Left message requesting call back.  Needs appointment scheduled with EP APP to discuss gen change.

## 2024-04-11 DIAGNOSIS — Z8546 Personal history of malignant neoplasm of prostate: Secondary | ICD-10-CM | POA: Diagnosis not present

## 2024-04-16 NOTE — Telephone Encounter (Signed)
 Pt is scheduled for PPM Gen Change with Dr. Kennyth on 8/4 at 3:00 PM. He is scheduled to see Charlies Arthur in clinic on 7/17 and we will get labs that day and go over procedure Instructions at that time.

## 2024-04-16 NOTE — Telephone Encounter (Signed)
 Patient returned staff call regarding scheduling procedure.

## 2024-04-16 NOTE — Telephone Encounter (Signed)
-----   Message from [facility] Karielle Davidow G sent at 04/12/2024 12:20 PM EDT ----- LM for pt to call back to schedule PPM Gen Change.  ----- Message ----- From: Kennyth Chew, MD Sent: 04/01/2024  11:39 AM EDT To: Tobie Hellen Dreama, CMA; [facility]  Remote pacemaker interrogation. Presenting Rhythm:A-paced V-sensed. Lead parameters stable with stable capture and sensing. Device programming is appropriate. Device has reached ERI; will need to  arrange for generator change.   ----- Message ----- From: Interface, [employer] Sent: 03/29/2024  10:13 PM EDT To: Chew Kennyth, MD

## 2024-04-17 DIAGNOSIS — R531 Weakness: Secondary | ICD-10-CM | POA: Diagnosis not present

## 2024-04-17 DIAGNOSIS — Z8546 Personal history of malignant neoplasm of prostate: Secondary | ICD-10-CM | POA: Diagnosis not present

## 2024-04-18 DIAGNOSIS — C44319 Basal cell carcinoma of skin of other parts of face: Secondary | ICD-10-CM | POA: Diagnosis not present

## 2024-04-18 DIAGNOSIS — Z85828 Personal history of other malignant neoplasm of skin: Secondary | ICD-10-CM | POA: Diagnosis not present

## 2024-04-18 DIAGNOSIS — L570 Actinic keratosis: Secondary | ICD-10-CM | POA: Diagnosis not present

## 2024-04-18 DIAGNOSIS — D044 Carcinoma in situ of skin of scalp and neck: Secondary | ICD-10-CM | POA: Diagnosis not present

## 2024-04-20 NOTE — Progress Notes (Signed)
 Remote pacemaker transmission.

## 2024-04-20 NOTE — Addendum Note (Signed)
 Addended by: TAWNI DRILLING D on: 04/20/2024 11:24 AM   Modules accepted: Orders, Level of Service
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
## 2024-04-26 ENCOUNTER — Ambulatory Visit: Attending: Physician Assistant | Admitting: Physician Assistant

## 2024-04-26 ENCOUNTER — Encounter: Payer: Self-pay | Admitting: Physician Assistant

## 2024-04-26 VITALS — BP 120/76 | HR 82 | Ht 70.0 in | Wt 236.3 lb

## 2024-04-26 DIAGNOSIS — T82110A Breakdown (mechanical) of cardiac electrode, initial encounter: Secondary | ICD-10-CM | POA: Diagnosis present

## 2024-04-26 DIAGNOSIS — I10 Essential (primary) hypertension: Secondary | ICD-10-CM | POA: Insufficient documentation

## 2024-04-26 DIAGNOSIS — Z952 Presence of prosthetic heart valve: Secondary | ICD-10-CM | POA: Insufficient documentation

## 2024-04-26 DIAGNOSIS — Z950 Presence of cardiac pacemaker: Secondary | ICD-10-CM | POA: Diagnosis present

## 2024-04-26 LAB — CUP PACEART INCLINIC DEVICE CHECK
Battery Remaining Longevity: 0 mo
Battery Voltage: 2.57 V
Brady Statistic RA Percent Paced: 26 %
Brady Statistic RV Percent Paced: 1 %
Date Time Interrogation Session: 20250717175111
Implantable Lead Connection Status: 753985
Implantable Lead Connection Status: 753985
Implantable Lead Implant Date: 20140702
Implantable Lead Implant Date: 20140702
Implantable Lead Location: 753859
Implantable Lead Location: 753860
Implantable Lead Model: 1948
Implantable Pulse Generator Implant Date: 20140702
Lead Channel Impedance Value: 400 Ohm
Lead Channel Impedance Value: 437.5 Ohm
Lead Channel Pacing Threshold Amplitude: 0.75 V
Lead Channel Pacing Threshold Amplitude: 0.75 V
Lead Channel Pacing Threshold Amplitude: 0.75 V
Lead Channel Pacing Threshold Amplitude: 0.75 V
Lead Channel Pacing Threshold Pulse Width: 0.4 ms
Lead Channel Pacing Threshold Pulse Width: 0.4 ms
Lead Channel Pacing Threshold Pulse Width: 0.4 ms
Lead Channel Pacing Threshold Pulse Width: 0.4 ms
Lead Channel Sensing Intrinsic Amplitude: 5 mV
Lead Channel Sensing Intrinsic Amplitude: 5.4 mV
Lead Channel Setting Pacing Amplitude: 2 V
Lead Channel Setting Pacing Amplitude: 2.5 V
Lead Channel Setting Pacing Pulse Width: 0.4 ms
Lead Channel Setting Sensing Sensitivity: 2 mV
Pulse Gen Model: 2210
Pulse Gen Serial Number: 7486308

## 2024-04-26 NOTE — Patient Instructions (Signed)
 Medication Instructions:   Your physician recommends that you continue on your current medications as directed. Please refer to the Current Medication list given to you today.   *If you need a refill on your cardiac medications before your next appointment, please call your pharmacy*   Lab Work:  PLEASE GO DOWN STAIRS  [facility]  FIRST FLOOR   ( GET OFF ELEVATORS WALK TOWARDS WAITING AREA LAB LOCATED BY PHARMACY):  BMET  AND CBC TODAY       If you have labs (blood work) drawn today and your tests are completely normal, you will receive your results only by: MyChart Message (if you have MyChart) OR A paper copy in the mail If you have any lab test that is abnormal or we need to change your treatment, we will call you to review the results.    Testing/Procedures: Your physician has requested that you have an echocardiogram. Echocardiography is a painless test that uses sound waves to create images of your heart. It provides your doctor with information about the size and shape of your heart and how well your heart's chambers and valves are working. This procedure takes approximately one hour. There are no restrictions for this procedure. Please do NOT wear cologne, perfume, aftershave, or lotions (deodorant is allowed). Please arrive 15 minutes prior to your appointment time.  Please note: We ask at that you not bring children with you during ultrasound (echo/ vascular) testing. Due to room size and safety concerns, children are not allowed in the ultrasound rooms during exams. Our front office staff cannot provide observation of children in our lobby area while testing is being conducted. An adult accompanying a patient to their appointment will only be allowed in the ultrasound room at the discretion of the ultrasound technician under special circumstances. We apologize for any inconvenience.      Follow-Up: At [facility], you and your health needs are our priority.  As part  of our continuing mission to provide you with exceptional heart care, our providers are all part of one team.  This team includes your primary Cardiologist (physician) and Advanced Practice Providers or APPs (Physician Assistants and Nurse Practitioners) who all work together to provide you with the care you need, when you need it.  Your next appointment:  AS  SCHEDULED WITH EP DEPARTMENT    We recommend signing up for the patient portal called MyChart.  Sign up information is provided on this After Visit Summary.  MyChart is used to connect with patients for Virtual Visits (Telemedicine).  Patients are able to view lab/test results, encounter notes, upcoming appointments, etc.  Non-urgent messages can be sent to your provider as well.   To learn more about what you can do with MyChart, go to [email or web address].   Other Instructions

## 2024-04-27 ENCOUNTER — Ambulatory Visit: Payer: Self-pay | Admitting: Physician Assistant

## 2024-04-27 LAB — CBC
Hematocrit: 41.8 % (ref 37.5–51.0)
Hemoglobin: 13.9 g/dL (ref 13.0–17.7)
MCH: 29.8 pg (ref 26.6–33.0)
MCHC: 33.3 g/dL (ref 31.5–35.7)
MCV: 90 fL (ref 79–97)
Platelets: 227 x10E3/uL (ref 150–450)
RBC: 4.66 x10E6/uL (ref 4.14–5.80)
RDW: 12.6 % (ref 11.6–15.4)
WBC: 5.2 x10E3/uL (ref 3.4–10.8)

## 2024-04-27 LAB — BASIC METABOLIC PANEL WITH GFR
BUN/Creatinine Ratio: 17 (ref 10–24)
BUN: 18 mg/dL (ref 8–27)
CO2: 22 mmol/L (ref 20–29)
Calcium: 9.2 mg/dL (ref 8.6–10.2)
Chloride: 99 mmol/L (ref 96–106)
Creatinine, Ser: 1.09 mg/dL (ref 0.76–1.27)
Glucose: 87 mg/dL (ref 70–99)
Potassium: 5.2 mmol/L (ref 3.5–5.2)
Sodium: 134 mmol/L (ref 134–144)
eGFR: 69 mL/min/1.73 (ref >59)

## 2024-04-30 ENCOUNTER — Ambulatory Visit

## 2024-04-30 DIAGNOSIS — I495 Sick sinus syndrome: Secondary | ICD-10-CM

## 2024-05-01 LAB — CUP PACEART REMOTE DEVICE CHECK
Battery Remaining Longevity: 1 mo
Battery Remaining Percentage: 0.5 %
Battery Voltage: 2.57 V
Brady Statistic AP VP Percent: 1 %
Brady Statistic AP VS Percent: 14 %
Brady Statistic AS VP Percent: 1 %
Brady Statistic AS VS Percent: 85 %
Brady Statistic RA Percent Paced: 15 %
Brady Statistic RV Percent Paced: 1 %
Date Time Interrogation Session: 20250721023052
Implantable Lead Connection Status: 753985
Implantable Lead Connection Status: 753985
Implantable Lead Implant Date: 20140702
Implantable Lead Implant Date: 20140702
Implantable Lead Location: 753859
Implantable Lead Location: 753860
Implantable Lead Model: 1948
Implantable Pulse Generator Implant Date: 20140702
Lead Channel Impedance Value: 380 Ohm
Lead Channel Impedance Value: 430 Ohm
Lead Channel Pacing Threshold Amplitude: 0.75 V
Lead Channel Pacing Threshold Amplitude: 0.75 V
Lead Channel Pacing Threshold Pulse Width: 0.4 ms
Lead Channel Pacing Threshold Pulse Width: 0.4 ms
Lead Channel Sensing Intrinsic Amplitude: 4.4 mV
Lead Channel Sensing Intrinsic Amplitude: 5.4 mV
Lead Channel Setting Pacing Amplitude: 2 V
Lead Channel Setting Pacing Amplitude: 2.5 V
Lead Channel Setting Pacing Pulse Width: 0.4 ms
Lead Channel Setting Sensing Sensitivity: 2 mV
Pulse Gen Model: 2210
Pulse Gen Serial Number: 7486308

## 2024-05-07 ENCOUNTER — Telehealth (HOSPITAL_COMMUNITY): Payer: Self-pay

## 2024-05-07 NOTE — Telephone Encounter (Signed)
 Spoke with patient to discuss upcoming procedure.   Confirmed patient is scheduled for a PPM generator change on Monday, August 4 with Dr. Sidra Kitty. Instructed patient to arrive at the Main Entrance A at [facility]: [address] [place], [place] [address] and check in at Admitting at 1:00 PM.   Labs completed  Any recent signs of acute illness or been started on antibiotics?  No Any new medications started? No Medication instructions:  On the morning of your procedure you may take your morning prescribed medications with a small sip of water. You may have a LIGHT breakfast prior to 7:00 AM on the morning of your procedure. Nothing to eat or drink after 7:00 AM except for sips of water with your medications.   The night before your procedure and the morning of your procedure, wash thoroughly with the CHG surgical soap from the neck down, paying special attention to the area where your procedure will be performed.  Advised of plan to go home the same day and will only stay overnight if medically necessary. You MUST have a responsible adult to drive you home and MUST be with you the first 24 hours after you arrive home.  Patient verbalized understanding to all instructions provided and agreed to proceed with procedure.
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
## 2024-05-09 ENCOUNTER — Ambulatory Visit: Attending: Physician Assistant

## 2024-05-09 DIAGNOSIS — Z952 Presence of prosthetic heart valve: Secondary | ICD-10-CM | POA: Insufficient documentation

## 2024-05-10 DIAGNOSIS — I10 Essential (primary) hypertension: Secondary | ICD-10-CM | POA: Diagnosis not present

## 2024-05-10 DIAGNOSIS — Z8546 Personal history of malignant neoplasm of prostate: Secondary | ICD-10-CM | POA: Diagnosis not present

## 2024-05-10 DIAGNOSIS — E782 Mixed hyperlipidemia: Secondary | ICD-10-CM | POA: Diagnosis not present

## 2024-05-10 DIAGNOSIS — F329 Major depressive disorder, single episode, unspecified: Secondary | ICD-10-CM | POA: Diagnosis not present

## 2024-05-10 LAB — ECHOCARDIOGRAM COMPLETE
AV Mean grad: 12 mmHg
AV Peak grad: 22.8 mmHg
Ao pk vel: 2.39 m/s
Area-P 1/2: 3.27 cm2
Calc EF: 66.3 %
S' Lateral: 2.7 cm
Single Plane A2C EF: 72.6 %
Single Plane A4C EF: 59.4 %

## 2024-05-11 ENCOUNTER — Encounter: Payer: Self-pay | Admitting: Emergency Medicine

## 2024-05-11 ENCOUNTER — Encounter: Payer: Self-pay | Admitting: *Deleted

## 2024-05-11 ENCOUNTER — Encounter: Payer: Self-pay | Admitting: Physician Assistant

## 2024-05-11 ENCOUNTER — Ambulatory Visit: Attending: Physician Assistant | Admitting: Physician Assistant

## 2024-05-11 VITALS — BP 120/84 | HR 74 | Ht 70.0 in | Wt 232.0 lb

## 2024-05-11 DIAGNOSIS — I10 Essential (primary) hypertension: Secondary | ICD-10-CM | POA: Diagnosis not present

## 2024-05-11 DIAGNOSIS — Z952 Presence of prosthetic heart valve: Secondary | ICD-10-CM | POA: Insufficient documentation

## 2024-05-11 DIAGNOSIS — Z950 Presence of cardiac pacemaker: Secondary | ICD-10-CM | POA: Diagnosis not present

## 2024-05-11 DIAGNOSIS — T82110D Breakdown (mechanical) of cardiac electrode, subsequent encounter: Secondary | ICD-10-CM | POA: Diagnosis not present

## 2024-05-11 DIAGNOSIS — Z45010 Encounter for checking and testing of cardiac pacemaker pulse generator [battery]: Secondary | ICD-10-CM | POA: Insufficient documentation

## 2024-05-11 LAB — CUP PACEART INCLINIC DEVICE CHECK
Battery Remaining Longevity: 0 mo
Battery Voltage: 2.56 V
Brady Statistic RA Percent Paced: 15 %
Brady Statistic RV Percent Paced: 0.73 %
Date Time Interrogation Session: 20250801093309
Implantable Lead Connection Status: 753985
Implantable Lead Connection Status: 753985
Implantable Lead Implant Date: 20140702
Implantable Lead Implant Date: 20140702
Implantable Lead Location: 753859
Implantable Lead Location: 753860
Implantable Lead Model: 1948
Implantable Pulse Generator Implant Date: 20140702
Lead Channel Impedance Value: 400 Ohm
Lead Channel Impedance Value: 412.5 Ohm
Lead Channel Pacing Threshold Amplitude: 0.75 V
Lead Channel Pacing Threshold Amplitude: 0.75 V
Lead Channel Pacing Threshold Amplitude: 0.75 V
Lead Channel Pacing Threshold Amplitude: 0.75 V
Lead Channel Pacing Threshold Pulse Width: 0.4 ms
Lead Channel Pacing Threshold Pulse Width: 0.4 ms
Lead Channel Pacing Threshold Pulse Width: 0.4 ms
Lead Channel Pacing Threshold Pulse Width: 0.4 ms
Lead Channel Sensing Intrinsic Amplitude: 5 mV
Lead Channel Sensing Intrinsic Amplitude: 5.5 mV
Lead Channel Setting Pacing Amplitude: 2 V
Lead Channel Setting Pacing Amplitude: 2.5 V
Lead Channel Setting Pacing Pulse Width: 0.4 ms
Lead Channel Setting Sensing Sensitivity: 2 mV
Pulse Gen Model: 2210
Pulse Gen Serial Number: 7486308

## 2024-05-11 NOTE — Patient Instructions (Signed)
 Medication Instructions:   Your physician recommends that you continue on your current medications as directed. Please refer to the Current Medication list given to you today.   *If you need a refill on your cardiac medications before your next appointment, please call your pharmacy*   Lab Work:  NONE ORDERED  TODAY     If you have labs (blood work) drawn today and your tests are completely normal, you will receive your results only by: MyChart Message (if you have MyChart) OR A paper copy in the mail If you have any lab test that is abnormal or we need to change your treatment, we will call you to review the results.    Testing/Procedures: NONE ORDERED  TODAY    Follow-Up: At [facility], you and your health needs are our priority.  As part of our continuing mission to provide you with exceptional heart care, our providers are all part of one team.  This team includes your primary Cardiologist (physician) and Advanced Practice Providers or APPs (Physician Assistants and Nurse Practitioners) who all work together to provide you with the care you need, when you need it.   Your next appointment:   AS SCHEDULED      We recommend signing up for the patient portal called "MyChart".  Sign up information is provided on this After Visit Summary.  MyChart is used to connect with patients for Virtual Visits (Telemedicine).  Patients are able to view lab/test results, encounter notes, upcoming appointments, etc.  Non-urgent messages can be sent to your provider as well.   To learn more about what you can do with MyChart, go to [email or web address].   Other Instructions

## 2024-05-11 NOTE — Pre-Procedure Instructions (Signed)
 Instructed patient on the following items: Arrival time 1230 Nothing to eat or drink after midnight No meds AM of procedure Responsible person to drive you home and stay with you for 24 hrs Wash with special soap night before and morning of procedure

## 2024-05-14 ENCOUNTER — Ambulatory Visit (HOSPITAL_COMMUNITY)
Admission: RE | Admit: 2024-05-14 | Discharge: 2024-05-14 | Disposition: A | Discharge status: Home / Self Care | Attending: Cardiology | Admitting: Cardiology

## 2024-05-14 ENCOUNTER — Ambulatory Visit (HOSPITAL_COMMUNITY)
Admission: RE | Disposition: A | Discharge status: Home / Self Care | Payer: Self-pay | Source: Home / Self Care | Attending: Cardiology

## 2024-05-14 ENCOUNTER — Other Ambulatory Visit: Payer: Self-pay

## 2024-05-14 ENCOUNTER — Encounter (HOSPITAL_COMMUNITY): Payer: Self-pay | Admitting: Cardiology

## 2024-05-14 DIAGNOSIS — Z8674 Personal history of sudden cardiac arrest: Secondary | ICD-10-CM | POA: Diagnosis not present

## 2024-05-14 DIAGNOSIS — I350 Nonrheumatic aortic (valve) stenosis: Secondary | ICD-10-CM | POA: Diagnosis not present

## 2024-05-14 DIAGNOSIS — I10 Essential (primary) hypertension: Secondary | ICD-10-CM | POA: Insufficient documentation

## 2024-05-14 DIAGNOSIS — E785 Hyperlipidemia, unspecified: Secondary | ICD-10-CM | POA: Insufficient documentation

## 2024-05-14 DIAGNOSIS — I495 Sick sinus syndrome: Secondary | ICD-10-CM | POA: Diagnosis not present

## 2024-05-14 DIAGNOSIS — Z45010 Encounter for checking and testing of cardiac pacemaker pulse generator [battery]: Secondary | ICD-10-CM

## 2024-05-14 HISTORY — PX: PPM GENERATOR CHANGEOUT: EP1233

## 2024-05-14 SURGERY — PPM GENERATOR CHANGEOUT

## 2024-05-14 MED ORDER — LIDOCAINE HCL (PF) 1 % IJ SOLN
INTRAMUSCULAR | Status: AC
  Filled 2024-05-14: qty 60

## 2024-05-14 MED ORDER — FENTANYL CITRATE (PF) 100 MCG/2ML IJ SOLN
INTRAMUSCULAR | Status: DC | PRN
  Administered 2024-05-14: 50 ug via INTRAVENOUS

## 2024-05-14 MED ORDER — FENTANYL CITRATE (PF) 100 MCG/2ML IJ SOLN
INTRAMUSCULAR | Status: AC
  Filled 2024-05-14: qty 2

## 2024-05-14 MED ORDER — CHLORHEXIDINE GLUCONATE 4 % EX SOLN
4.0000 | Freq: Once | CUTANEOUS | Status: DC
  Filled 2024-05-14: qty 60

## 2024-05-14 MED ORDER — POVIDONE-IODINE 10 % EX SWAB
2.0000 | Freq: Once | CUTANEOUS | Status: AC
  Administered 2024-05-14: 2 via TOPICAL

## 2024-05-14 MED ORDER — MIDAZOLAM HCL 2 MG/2ML IJ SOLN
INTRAMUSCULAR | Status: AC
  Filled 2024-05-14: qty 2

## 2024-05-14 MED ORDER — SODIUM CHLORIDE 0.9 % IV SOLN: 80.0000 mg | INTRAVENOUS | Status: AC

## 2024-05-14 MED ORDER — SODIUM CHLORIDE 0.9 % IV SOLN
INTRAVENOUS | Status: AC
Start: 2024-05-14 — End: 2024-05-14
  Administered 2024-05-14: 80 mg
  Filled 2024-05-14: qty 2

## 2024-05-14 MED ORDER — MIDAZOLAM HCL 5 MG/5ML IJ SOLN
INTRAMUSCULAR | Status: DC | PRN
  Administered 2024-05-14: 1 mg via INTRAVENOUS

## 2024-05-14 MED ORDER — SODIUM CHLORIDE 0.9 % IV SOLN: INTRAVENOUS | Status: DC

## 2024-05-14 MED ORDER — LIDOCAINE HCL (PF) 1 % IJ SOLN
INTRAMUSCULAR | Status: DC | PRN
  Administered 2024-05-14: 40 mL

## 2024-05-14 MED ORDER — ACETAMINOPHEN 325 MG PO TABS: 325.0000 mg | ORAL_TABLET | ORAL | Status: DC | PRN

## 2024-05-14 MED ORDER — CEFAZOLIN SODIUM-DEXTROSE 2-4 GM/100ML-% IV SOLN: 2.0000 g | INTRAVENOUS | Status: DC

## 2024-05-14 MED ORDER — ONDANSETRON HCL 4 MG/2ML IJ SOLN: 4.0000 mg | Freq: Four times a day (QID) | INTRAMUSCULAR | Status: DC | PRN

## 2024-05-14 MED ORDER — CEFAZOLIN SODIUM-DEXTROSE 2-4 GM/100ML-% IV SOLN
INTRAVENOUS | Status: AC
  Filled 2024-05-14: qty 100

## 2024-05-14 MED ORDER — CEFAZOLIN SODIUM-DEXTROSE 2-4 GM/100ML-% IV SOLN
INTRAVENOUS | Status: AC
  Administered 2024-05-14: 2 g
  Filled 2024-05-14: qty 100

## 2024-05-14 SURGICAL SUPPLY — 5 items
CABLE SURGICAL S-101-97-12 (CABLE) ×1 IMPLANT
ELECT DEFIB PAD ADLT CADENCE (PAD) IMPLANT
PACEMAKER ASSURITY DR-RF (Pacemaker) IMPLANT
POUCH AIGIS-R ANTIBACT PPM MED (Mesh General) IMPLANT
TRAY PACEMAKER INSERTION (PACKS) ×1 IMPLANT
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
## 2024-05-14 NOTE — Discharge Instructions (Signed)

## 2024-05-17 MED FILL — Midazolam HCl Inj 2 MG/2ML (Base Equivalent): INTRAMUSCULAR | Qty: 1 | Status: AC

## 2024-05-20 ENCOUNTER — Ambulatory Visit: Payer: Self-pay | Admitting: Cardiology

## 2024-05-23 ENCOUNTER — Ambulatory Visit: Payer: Self-pay | Admitting: Cardiology

## 2024-05-28 ENCOUNTER — Telehealth: Payer: Self-pay

## 2024-05-28 NOTE — Telephone Encounter (Signed)
 Alert remote transmission: VHR 1 VHR on 8/16 at 1038 x 57 beats and EGM begins with a tachy.  V-rates 202 bpm with V>A.  Later in EGM,  intermittent P wave undersensing that VA conduction cannot be excluded with V-rates in the 120's. Several AMS, longest x 16 sec . Known RA lead noise.   ____________________________________________________________________  Unable to reach patient to assess symptoms regarding device alert. Left voicemail to call back and remind patient of upcoming appointment tomorrow w/ the device clinic.   Device alert noted for [facility] episode c/w VT w/ duration 8 seconds. Several AMS episodes noted as well as atrial undersensing, oversensing and lead noise. After reviewing w/ [employer] Rep recommendations to reprogram atrial lead sensitivity from 0.44mV to 1.24mV. [employer] Rep notified of time of appointment on 8/19 in order to be present for programming changes.   Device clinic number given and patient advised to call back.

## 2024-05-29 ENCOUNTER — Ambulatory Visit: Attending: Cardiology

## 2024-05-29 DIAGNOSIS — I495 Sick sinus syndrome: Secondary | ICD-10-CM | POA: Diagnosis not present

## 2024-05-29 LAB — CUP PACEART INCLINIC DEVICE CHECK
Brady Statistic RA Percent Paced: 9.6 %
Brady Statistic RV Percent Paced: 1 % — CL
Date Time Interrogation Session: 20250819112610
Implantable Lead Connection Status: 753985
Implantable Lead Connection Status: 753985
Implantable Lead Implant Date: 20140702
Implantable Lead Implant Date: 20140702
Implantable Lead Location: 753859
Implantable Lead Location: 753860
Implantable Lead Model: 1948
Implantable Pulse Generator Implant Date: 20140702
Pulse Gen Model: 2210
Pulse Gen Serial Number: 7486308

## 2024-05-29 NOTE — Progress Notes (Signed)
 Normal dual chamber pacemaker wound check. Presenting rhythm: AS/VS. Wound well healed. Routine testing performed. Thresholds, sensing, and impedances consistent with implant measurements. AT/AF burden <1%, AF noted, RA noise noted (not a new finding.) RA sensitivity decreased from 0.5 mV to 1.0 mV per Dorise, [employer] rep.  Pt enrolled in remote follow-up.

## 2024-05-29 NOTE — Patient Instructions (Signed)

## 2024-05-30 DIAGNOSIS — Z85828 Personal history of other malignant neoplasm of skin: Secondary | ICD-10-CM | POA: Diagnosis not present

## 2024-05-30 DIAGNOSIS — C44319 Basal cell carcinoma of skin of other parts of face: Secondary | ICD-10-CM | POA: Diagnosis not present

## 2024-05-31 ENCOUNTER — Encounter

## 2024-06-04 ENCOUNTER — Ambulatory Visit: Payer: Self-pay | Admitting: Cardiology

## 2024-06-05 NOTE — Progress Notes (Signed)
 Remote pacemaker transmission.

## 2024-06-10 DIAGNOSIS — E782 Mixed hyperlipidemia: Secondary | ICD-10-CM | POA: Diagnosis not present

## 2024-06-10 DIAGNOSIS — F329 Major depressive disorder, single episode, unspecified: Secondary | ICD-10-CM | POA: Diagnosis not present

## 2024-06-10 DIAGNOSIS — I10 Essential (primary) hypertension: Secondary | ICD-10-CM | POA: Diagnosis not present

## 2024-06-10 DIAGNOSIS — Z8546 Personal history of malignant neoplasm of prostate: Secondary | ICD-10-CM | POA: Diagnosis not present

## 2024-06-18 ENCOUNTER — Encounter

## 2024-06-18 NOTE — Telephone Encounter (Signed)
 Spoke with patient regarding Metoprolol  initiation.   Patient says he cannot tolerate beta blockers and in particular Metoprolol .  He reports that it causes him extreme fatigue and he cannot function with it.  Reports that he has been consuming excessive amounts of caffeine over the past month and will cut that out. Otherwise no changes and he has not had any symptoms.   He requests that we monitor for now as he is cutting back caffeine and revisit this if rates continue to be elevated.   Forwarding to Dr. Kennyth as RICK.

## 2024-07-02 ENCOUNTER — Encounter

## 2024-07-10 DIAGNOSIS — F329 Major depressive disorder, single episode, unspecified: Secondary | ICD-10-CM | POA: Diagnosis not present

## 2024-07-10 DIAGNOSIS — Z8546 Personal history of malignant neoplasm of prostate: Secondary | ICD-10-CM | POA: Diagnosis not present

## 2024-07-10 DIAGNOSIS — E782 Mixed hyperlipidemia: Secondary | ICD-10-CM | POA: Diagnosis not present

## 2024-07-10 DIAGNOSIS — I10 Essential (primary) hypertension: Secondary | ICD-10-CM | POA: Diagnosis not present

## 2024-07-16 NOTE — Progress Notes (Signed)
 Remote PPM Transmission

## 2024-07-20 ENCOUNTER — Encounter

## 2024-08-02 ENCOUNTER — Encounter

## 2024-08-07 DIAGNOSIS — R5383 Other fatigue: Secondary | ICD-10-CM | POA: Diagnosis not present

## 2024-08-07 DIAGNOSIS — R630 Anorexia: Secondary | ICD-10-CM | POA: Diagnosis not present

## 2024-08-10 DIAGNOSIS — F329 Major depressive disorder, single episode, unspecified: Secondary | ICD-10-CM | POA: Diagnosis not present

## 2024-08-10 DIAGNOSIS — Z8546 Personal history of malignant neoplasm of prostate: Secondary | ICD-10-CM | POA: Diagnosis not present

## 2024-08-10 DIAGNOSIS — E782 Mixed hyperlipidemia: Secondary | ICD-10-CM | POA: Diagnosis not present

## 2024-08-10 DIAGNOSIS — I10 Essential (primary) hypertension: Secondary | ICD-10-CM | POA: Diagnosis not present

## 2024-08-13 ENCOUNTER — Ambulatory Visit (INDEPENDENT_AMBULATORY_CARE_PROVIDER_SITE_OTHER)

## 2024-08-13 ENCOUNTER — Encounter: Admitting: Pulmonary Disease

## 2024-08-13 DIAGNOSIS — I495 Sick sinus syndrome: Secondary | ICD-10-CM | POA: Diagnosis not present

## 2024-08-14 LAB — CUP PACEART REMOTE DEVICE CHECK
Battery Remaining Longevity: 117 mo
Battery Remaining Percentage: 95.5 %
Battery Voltage: 3.04 V
Brady Statistic AP VP Percent: 1 %
Brady Statistic AP VS Percent: 21 %
Brady Statistic AS VP Percent: 1 %
Brady Statistic AS VS Percent: 79 %
Brady Statistic RA Percent Paced: 21 %
Brady Statistic RV Percent Paced: 1 %
Date Time Interrogation Session: 20251103010014
Lead Channel Impedance Value: 290 Ohm
Lead Channel Impedance Value: 390 Ohm
Lead Channel Pacing Threshold Amplitude: 0.75 V
Lead Channel Pacing Threshold Amplitude: 0.75 V
Lead Channel Pacing Threshold Pulse Width: 0.5 ms
Lead Channel Pacing Threshold Pulse Width: 0.5 ms
Lead Channel Sensing Intrinsic Amplitude: 4.7 mV
Lead Channel Sensing Intrinsic Amplitude: 5 mV
Lead Channel Setting Pacing Amplitude: 2 V
Lead Channel Setting Pacing Amplitude: 2 V
Lead Channel Setting Pacing Pulse Width: 0.5 ms
Lead Channel Setting Sensing Sensitivity: 2 mV
Pulse Gen Model: 2272
Pulse Gen Serial Number: 8291087

## 2024-08-15 ENCOUNTER — Ambulatory Visit: Payer: Self-pay | Admitting: Cardiology

## 2024-08-17 DIAGNOSIS — E871 Hypo-osmolality and hyponatremia: Secondary | ICD-10-CM | POA: Diagnosis not present

## 2024-08-17 DIAGNOSIS — I10 Essential (primary) hypertension: Secondary | ICD-10-CM | POA: Diagnosis not present

## 2024-08-17 DIAGNOSIS — M797 Fibromyalgia: Secondary | ICD-10-CM | POA: Diagnosis not present

## 2024-08-17 NOTE — Progress Notes (Signed)
 Remote PPM Transmission

## 2024-08-22 ENCOUNTER — Ambulatory Visit: Attending: Student | Admitting: Student

## 2024-08-22 ENCOUNTER — Ambulatory Visit: Payer: Self-pay | Admitting: Cardiology

## 2024-08-22 ENCOUNTER — Encounter: Payer: Self-pay | Admitting: Student

## 2024-08-22 VITALS — BP 112/70 | HR 66 | Ht 70.0 in | Wt 224.0 lb

## 2024-08-22 DIAGNOSIS — Z950 Presence of cardiac pacemaker: Secondary | ICD-10-CM | POA: Diagnosis not present

## 2024-08-22 DIAGNOSIS — I495 Sick sinus syndrome: Secondary | ICD-10-CM | POA: Diagnosis not present

## 2024-08-22 DIAGNOSIS — Z952 Presence of prosthetic heart valve: Secondary | ICD-10-CM | POA: Diagnosis not present

## 2024-08-22 DIAGNOSIS — I10 Essential (primary) hypertension: Secondary | ICD-10-CM | POA: Diagnosis not present

## 2024-08-22 DIAGNOSIS — I4729 Other ventricular tachycardia: Secondary | ICD-10-CM | POA: Diagnosis not present

## 2024-08-22 LAB — CUP PACEART INCLINIC DEVICE CHECK
Date Time Interrogation Session: 20251112103032
Implantable Lead Connection Status: 753985
Implantable Lead Connection Status: 753985
Implantable Lead Implant Date: 20140702
Implantable Lead Implant Date: 20140702
Implantable Lead Location: 753859
Implantable Lead Location: 753860
Implantable Lead Model: 1948
Implantable Pulse Generator Implant Date: 20140702
Pulse Gen Model: 2210
Pulse Gen Serial Number: 7486308

## 2024-08-22 NOTE — Progress Notes (Signed)
  Electrophysiology Office Note:   ID:  Manuel Escobar, [place] 08-Oct-1946, MRN [id number]  Primary Cardiologist: Gordy Bergamo, MD Electrophysiologist: Fonda Kitty, MD      History of Present Illness:   ATA PECHA is a 78 y.o. male with h/o HTN, HLD, VHD s/p AVR, and bradycardia s/p PPM seen today for routine electrophysiology follow-up s/p gen change.  Since last being seen in our clinic the patient reports doing very well. Overall, he denies chest pain, palpitations, dyspnea, PND, orthopnea, nausea, vomiting, dizziness, syncope, edema, weight gain, or early satiety.   Site well healed.  Review of systems complete and found to be negative unless listed in HPI.   EP Information / Studies Reviewed:    EKG is ordered today. Personal review as below.  EKG Interpretation Date/Time:  Wednesday August 22 2024 09:54:51 EST Ventricular Rate:  66 PR Interval:  160 QRS Duration:  84 QT Interval:  382 QTC Calculation: 400 R Axis:   69  Text Interpretation: Normal sinus rhythm Low voltage QRS When compared with ECG of 26-Apr-2024 09:05, No significant change was found Confirmed by Lesia Sharper [phone]) on 08/22/2024 10:31:58 AM    PPM Interrogation-  reviewed in detail today,  See PACEART report.  Arrhythmia/Device History Abbott Dual Chamber PPM 04/2013 for symptomatic bradycardia, gen change 05/2024   Physical Exam:   VS:  BP 112/70   Pulse 66   Ht 5' 10 (1.778 m)   Wt 224 lb (101.6 kg)   BMI 32.14 kg/m    Wt Readings from Last 3 Encounters:  08/22/24 224 lb (101.6 kg)  05/14/24 225 lb (102.1 kg)  05/11/24 232 lb (105.2 kg)     GEN: No acute distress  NECK: No JVD; No carotid bruits CARDIAC: Regular rate and rhythm, no murmurs, rubs, gallops RESPIRATORY:  Clear to auscultation without rales, wheezing or rhonchi  ABDOMEN: Soft, non-tender, non-distended EXTREMITIES:  No edema; No deformity   ASSESSMENT AND PLAN:    Symptomatic bradycardia s/p Abbott PPM  Atrial lead  noise S/p gen change 05/2024 Normal PPM function See Elisabeth Art report No changes today  NSVT Refuses BB at this time.  Continue to monitor  VHD H/o bioprosthetic AVR Stable Echo 04/2024  HTN Stable on current regimen   Disposition:   Follow up with EP Team in 12 months  Signed, Sharper Prentice Lesia, PA-C

## 2024-08-22 NOTE — Patient Instructions (Signed)
 Medication Instructions:  No medication changes today. *If you need a refill on your cardiac medications before your next appointment, please call your pharmacy*  Lab Work: No labwork ordered today. If you have labs (blood work) drawn today and your tests are completely normal, you will receive your results only by: MyChart Message (if you have MyChart) OR A paper copy in the mail If you have any lab test that is abnormal or we need to change your treatment, we will call you to review the results.  Testing/Procedures: No testing ordered today  Follow-Up: At [facility], you and your health needs are our priority.  As part of our continuing mission to provide you with exceptional heart care, our providers are all part of one team.  This team includes your primary Cardiologist (physician) and Advanced Practice Providers or APPs (Physician Assistants and Nurse Practitioners) who all work together to provide you with the care you need, when you need it.  Your next appointment:   12 month(s)  Provider:   You may see Ardeen Kohler, MD or one of the following Advanced Practice Providers on your designated Care Team:   Mertha Abrahams, Kennard Pea "Jonelle Neri" Bon[place], PA-C Suzann Riddle, NP Creighton Doffing, NP    We recommend signing up for the patient portal called "MyChart".  Sign up information is provided on this After Visit Summary.  MyChart is used to connect with patients for Virtual Visits (Telemedicine).  Patients are able to view lab/test results, encounter notes, upcoming appointments, etc.  Non-urgent messages can be sent to your provider as well.   To learn more about what you can do with MyChart, go to [email or web address].

## 2024-08-23 DIAGNOSIS — Z5181 Encounter for therapeutic drug level monitoring: Secondary | ICD-10-CM | POA: Diagnosis not present

## 2024-08-23 DIAGNOSIS — F341 Dysthymic disorder: Secondary | ICD-10-CM | POA: Diagnosis not present

## 2024-09-09 DIAGNOSIS — Z8546 Personal history of malignant neoplasm of prostate: Secondary | ICD-10-CM | POA: Diagnosis not present

## 2024-09-09 DIAGNOSIS — E782 Mixed hyperlipidemia: Secondary | ICD-10-CM | POA: Diagnosis not present

## 2024-09-09 DIAGNOSIS — I10 Essential (primary) hypertension: Secondary | ICD-10-CM | POA: Diagnosis not present

## 2024-09-09 DIAGNOSIS — F329 Major depressive disorder, single episode, unspecified: Secondary | ICD-10-CM | POA: Diagnosis not present

## 2024-09-11 DIAGNOSIS — G4733 Obstructive sleep apnea (adult) (pediatric): Secondary | ICD-10-CM | POA: Diagnosis not present

## 2024-09-11 DIAGNOSIS — Z860100 Personal history of colon polyps, unspecified: Secondary | ICD-10-CM | POA: Diagnosis not present

## 2024-09-11 DIAGNOSIS — I10 Essential (primary) hypertension: Secondary | ICD-10-CM | POA: Diagnosis not present

## 2024-09-11 DIAGNOSIS — M5136 Other intervertebral disc degeneration, lumbar region with discogenic back pain only: Secondary | ICD-10-CM | POA: Diagnosis not present

## 2024-09-11 DIAGNOSIS — Z1331 Encounter for screening for depression: Secondary | ICD-10-CM | POA: Diagnosis not present

## 2024-09-11 DIAGNOSIS — Z23 Encounter for immunization: Secondary | ICD-10-CM | POA: Diagnosis not present

## 2024-09-11 DIAGNOSIS — Z8546 Personal history of malignant neoplasm of prostate: Secondary | ICD-10-CM | POA: Diagnosis not present

## 2024-09-11 DIAGNOSIS — I495 Sick sinus syndrome: Secondary | ICD-10-CM | POA: Diagnosis not present

## 2024-09-11 DIAGNOSIS — Z0000 Encounter for general adult medical examination without abnormal findings: Secondary | ICD-10-CM | POA: Diagnosis not present

## 2024-09-11 DIAGNOSIS — I6529 Occlusion and stenosis of unspecified carotid artery: Secondary | ICD-10-CM | POA: Diagnosis not present

## 2024-09-11 DIAGNOSIS — E782 Mixed hyperlipidemia: Secondary | ICD-10-CM | POA: Diagnosis not present

## 2024-09-11 DIAGNOSIS — R7303 Prediabetes: Secondary | ICD-10-CM | POA: Diagnosis not present

## 2024-09-17 ENCOUNTER — Encounter

## 2024-10-19 ENCOUNTER — Encounter

## 2024-11-12 ENCOUNTER — Ambulatory Visit

## 2024-11-12 DIAGNOSIS — I495 Sick sinus syndrome: Secondary | ICD-10-CM

## 2024-11-13 LAB — CUP PACEART REMOTE DEVICE CHECK
Battery Remaining Longevity: 116 mo
Battery Remaining Percentage: 95.5 %
Battery Voltage: 3.02 V
Brady Statistic AP VP Percent: 1 %
Brady Statistic AP VS Percent: 9.9 %
Brady Statistic AS VP Percent: 1 %
Brady Statistic AS VS Percent: 90 %
Brady Statistic RA Percent Paced: 9.8 %
Brady Statistic RV Percent Paced: 1 %
Date Time Interrogation Session: 20260202020014
Lead Channel Impedance Value: 260 Ohm
Lead Channel Impedance Value: 390 Ohm
Lead Channel Pacing Threshold Amplitude: 0.75 V
Lead Channel Pacing Threshold Amplitude: 0.75 V
Lead Channel Pacing Threshold Pulse Width: 0.5 ms
Lead Channel Pacing Threshold Pulse Width: 0.5 ms
Lead Channel Sensing Intrinsic Amplitude: 4.6 mV
Lead Channel Sensing Intrinsic Amplitude: 5.1 mV
Lead Channel Setting Pacing Amplitude: 2 V
Lead Channel Setting Pacing Amplitude: 2 V
Lead Channel Setting Pacing Pulse Width: 0.5 ms
Lead Channel Setting Sensing Sensitivity: 2 mV
Pulse Gen Model: 2272
Pulse Gen Serial Number: 8291087

## 2024-11-16 NOTE — Progress Notes (Signed)
 Remote PPM Transmission

## 2024-12-17 ENCOUNTER — Encounter

## 2025-01-18 ENCOUNTER — Encounter

## 2025-02-11 ENCOUNTER — Encounter

## 2025-03-18 ENCOUNTER — Encounter

## 2025-04-19 ENCOUNTER — Encounter

## 2025-05-13 ENCOUNTER — Encounter

## 2025-06-18 ENCOUNTER — Encounter

## 2025-07-19 ENCOUNTER — Encounter

## 2025-09-16 ENCOUNTER — Encounter

## 2025-10-18 ENCOUNTER — Encounter

## 2025-12-16 ENCOUNTER — Encounter

## 2026-01-17 ENCOUNTER — Encounter

## 2026-03-17 ENCOUNTER — Encounter

## 2026-04-18 ENCOUNTER — Encounter
